# Patient Record
Sex: Female | Born: 1935 | Race: White | Hispanic: No | Marital: Married | Smoking: Former smoker
Health system: Southern US, Community
[De-identification: ages and names within clinical notes are randomized; demographics above are authoritative.]

## PROBLEM LIST (undated history)

## (undated) DIAGNOSIS — R0602 Shortness of breath: Secondary | ICD-10-CM

## (undated) DIAGNOSIS — I10 Essential (primary) hypertension: Secondary | ICD-10-CM

## (undated) DIAGNOSIS — K5900 Constipation, unspecified: Secondary | ICD-10-CM

## (undated) DIAGNOSIS — A0472 Enterocolitis due to Clostridium difficile, not specified as recurrent: Secondary | ICD-10-CM

## (undated) DIAGNOSIS — N189 Chronic kidney disease, unspecified: Secondary | ICD-10-CM

## (undated) DIAGNOSIS — Z7901 Long term (current) use of anticoagulants: Secondary | ICD-10-CM

## (undated) DIAGNOSIS — K219 Gastro-esophageal reflux disease without esophagitis: Secondary | ICD-10-CM

## (undated) DIAGNOSIS — E7800 Pure hypercholesterolemia, unspecified: Secondary | ICD-10-CM

## (undated) HISTORY — PX: ABDOMINAL SURGERY: SHX537

---

## 1998-03-06 ENCOUNTER — Ambulatory Visit (HOSPITAL_COMMUNITY): Admission: RE | Admit: 1998-03-06 | Discharge: 1998-03-06 | Payer: Self-pay | Admitting: Gynecology

## 1998-05-24 ENCOUNTER — Other Ambulatory Visit: Admission: RE | Admit: 1998-05-24 | Discharge: 1998-05-24 | Payer: Self-pay | Admitting: Gynecology

## 1998-12-13 ENCOUNTER — Ambulatory Visit (HOSPITAL_COMMUNITY): Admission: RE | Admit: 1998-12-13 | Discharge: 1998-12-13 | Payer: Self-pay | Admitting: *Deleted

## 1999-06-19 ENCOUNTER — Other Ambulatory Visit: Admission: RE | Admit: 1999-06-19 | Discharge: 1999-06-19 | Payer: Self-pay | Admitting: Gynecology

## 2000-06-25 ENCOUNTER — Encounter: Payer: Self-pay | Admitting: Gynecology

## 2000-06-25 ENCOUNTER — Encounter: Admission: RE | Admit: 2000-06-25 | Discharge: 2000-06-25 | Payer: Self-pay | Admitting: Gynecology

## 2000-06-25 ENCOUNTER — Other Ambulatory Visit: Admission: RE | Admit: 2000-06-25 | Discharge: 2000-06-25 | Payer: Self-pay | Admitting: Gynecology

## 2001-07-29 ENCOUNTER — Other Ambulatory Visit: Admission: RE | Admit: 2001-07-29 | Discharge: 2001-07-29 | Payer: Self-pay | Admitting: Gynecology

## 2002-10-12 ENCOUNTER — Other Ambulatory Visit: Admission: RE | Admit: 2002-10-12 | Discharge: 2002-10-12 | Payer: Self-pay | Admitting: Gynecology

## 2003-05-16 ENCOUNTER — Ambulatory Visit (HOSPITAL_COMMUNITY): Admission: RE | Admit: 2003-05-16 | Discharge: 2003-05-16 | Payer: Self-pay | Admitting: *Deleted

## 2004-12-11 ENCOUNTER — Other Ambulatory Visit: Admission: RE | Admit: 2004-12-11 | Discharge: 2004-12-11 | Payer: Self-pay | Admitting: Gynecology

## 2006-01-14 ENCOUNTER — Other Ambulatory Visit: Admission: RE | Admit: 2006-01-14 | Discharge: 2006-01-14 | Payer: Self-pay | Admitting: Gynecology

## 2006-09-18 ENCOUNTER — Emergency Department: Payer: Self-pay | Admitting: Emergency Medicine

## 2007-02-12 ENCOUNTER — Other Ambulatory Visit: Admission: RE | Admit: 2007-02-12 | Discharge: 2007-02-12 | Payer: Self-pay | Admitting: Gynecology

## 2008-10-18 ENCOUNTER — Emergency Department (HOSPITAL_COMMUNITY): Admission: EM | Admit: 2008-10-18 | Discharge: 2008-10-18 | Payer: Self-pay | Admitting: Emergency Medicine

## 2008-10-20 ENCOUNTER — Encounter (HOSPITAL_COMMUNITY): Admission: RE | Admit: 2008-10-20 | Discharge: 2009-01-18 | Payer: Self-pay | Admitting: Emergency Medicine

## 2011-02-06 ENCOUNTER — Other Ambulatory Visit: Payer: Self-pay | Admitting: Gynecology

## 2011-02-06 DIAGNOSIS — Z1231 Encounter for screening mammogram for malignant neoplasm of breast: Secondary | ICD-10-CM

## 2011-02-25 ENCOUNTER — Ambulatory Visit
Admission: RE | Admit: 2011-02-25 | Discharge: 2011-02-25 | Disposition: A | Discharge status: Home / Self Care | Payer: Medicare Other | Source: Ambulatory Visit | Attending: Gynecology | Admitting: Gynecology

## 2011-02-25 DIAGNOSIS — Z1231 Encounter for screening mammogram for malignant neoplasm of breast: Secondary | ICD-10-CM

## 2012-05-28 ENCOUNTER — Other Ambulatory Visit: Payer: Self-pay | Admitting: Gynecology

## 2012-05-28 DIAGNOSIS — Z1231 Encounter for screening mammogram for malignant neoplasm of breast: Secondary | ICD-10-CM

## 2012-08-27 ENCOUNTER — Ambulatory Visit
Admission: RE | Admit: 2012-08-27 | Discharge: 2012-08-27 | Disposition: A | Discharge status: Home / Self Care | Payer: Medicare Other | Source: Ambulatory Visit | Attending: Gynecology | Admitting: Gynecology

## 2012-08-27 DIAGNOSIS — Z1231 Encounter for screening mammogram for malignant neoplasm of breast: Secondary | ICD-10-CM

## 2013-04-12 ENCOUNTER — Other Ambulatory Visit: Payer: Self-pay | Admitting: Family Medicine

## 2013-04-12 DIAGNOSIS — N63 Unspecified lump in unspecified breast: Secondary | ICD-10-CM

## 2013-05-04 ENCOUNTER — Ambulatory Visit
Admission: RE | Admit: 2013-05-04 | Discharge: 2013-05-04 | Disposition: A | Discharge status: Home / Self Care | Payer: Medicare Other | Source: Ambulatory Visit | Attending: Family Medicine | Admitting: Family Medicine

## 2013-05-04 DIAGNOSIS — N63 Unspecified lump in unspecified breast: Secondary | ICD-10-CM

## 2013-09-09 ENCOUNTER — Other Ambulatory Visit: Payer: Self-pay

## 2013-09-09 DIAGNOSIS — Z1231 Encounter for screening mammogram for malignant neoplasm of breast: Secondary | ICD-10-CM

## 2013-09-30 ENCOUNTER — Ambulatory Visit
Admission: RE | Admit: 2013-09-30 | Discharge: 2013-09-30 | Disposition: A | Discharge status: Home / Self Care | Payer: Medicare Other | Source: Ambulatory Visit

## 2013-09-30 DIAGNOSIS — Z1231 Encounter for screening mammogram for malignant neoplasm of breast: Secondary | ICD-10-CM

## 2014-04-17 ENCOUNTER — Ambulatory Visit
Admission: RE | Admit: 2014-04-17 | Discharge: 2014-04-17 | Disposition: A | Discharge status: Home / Self Care | Payer: Medicare Other | Source: Ambulatory Visit | Attending: Family Medicine | Admitting: Family Medicine

## 2014-04-17 ENCOUNTER — Other Ambulatory Visit: Payer: Self-pay | Admitting: Family Medicine

## 2014-04-17 DIAGNOSIS — R0602 Shortness of breath: Secondary | ICD-10-CM

## 2014-04-26 ENCOUNTER — Other Ambulatory Visit: Payer: Self-pay | Admitting: Orthopedic Surgery

## 2014-04-26 DIAGNOSIS — M48061 Spinal stenosis, lumbar region without neurogenic claudication: Secondary | ICD-10-CM

## 2014-06-09 ENCOUNTER — Encounter (HOSPITAL_COMMUNITY): Payer: Self-pay | Admitting: Pharmacy Technician

## 2014-06-13 ENCOUNTER — Encounter (HOSPITAL_COMMUNITY): Payer: Self-pay | Admitting: *Deleted

## 2014-06-13 ENCOUNTER — Ambulatory Visit (HOSPITAL_COMMUNITY)
Admission: RE | Admit: 2014-06-13 | Discharge: 2014-06-13 | Disposition: A | Discharge status: Home / Self Care | Payer: Medicare Other | Source: Ambulatory Visit | Attending: Cardiology | Admitting: Cardiology

## 2014-06-13 ENCOUNTER — Encounter (HOSPITAL_COMMUNITY)
Admission: RE | Disposition: A | Discharge status: Home / Self Care | Payer: Self-pay | Source: Ambulatory Visit | Attending: Cardiology

## 2014-06-13 DIAGNOSIS — R0602 Shortness of breath: Secondary | ICD-10-CM | POA: Diagnosis present

## 2014-06-13 HISTORY — PX: TEE WITHOUT CARDIOVERSION: SHX5443

## 2014-06-13 HISTORY — DX: Gastro-esophageal reflux disease without esophagitis: K21.9

## 2014-06-13 HISTORY — DX: Shortness of breath: R06.02

## 2014-06-13 HISTORY — DX: Essential (primary) hypertension: I10

## 2014-06-13 HISTORY — DX: Chronic kidney disease, unspecified: N18.9

## 2014-06-13 SURGERY — ECHOCARDIOGRAM, TRANSESOPHAGEAL
Anesthesia: Moderate Sedation

## 2014-06-13 MED ORDER — BUTAMBEN-TETRACAINE-BENZOCAINE 2-2-14 % EX AERO
INHALATION_SPRAY | CUTANEOUS | Status: DC | PRN
  Administered 2014-06-13: 2 via TOPICAL

## 2014-06-13 MED ORDER — SODIUM CHLORIDE 0.9 % IV SOLN
INTRAVENOUS | Status: DC
  Administered 2014-06-13: 500 mL via INTRAVENOUS

## 2014-06-13 MED ORDER — MIDAZOLAM HCL 10 MG/2ML IJ SOLN
INTRAMUSCULAR | Status: DC | PRN
  Administered 2014-06-13: 2 mg via INTRAVENOUS
  Administered 2014-06-13 (×2): 1 mg via INTRAVENOUS
  Administered 2014-06-13 (×2): 2 mg via INTRAVENOUS

## 2014-06-13 MED ORDER — FENTANYL CITRATE 0.05 MG/ML IJ SOLN
INTRAMUSCULAR | Status: DC | PRN
  Administered 2014-06-13 (×3): 25 ug via INTRAVENOUS

## 2014-06-13 MED ORDER — FENTANYL CITRATE 0.05 MG/ML IJ SOLN
INTRAMUSCULAR | Status: AC
  Filled 2014-06-13: qty 2

## 2014-06-13 MED ORDER — MIDAZOLAM HCL 5 MG/ML IJ SOLN
INTRAMUSCULAR | Status: AC
  Filled 2014-06-13: qty 2

## 2014-06-13 NOTE — H&P (Signed)
  Please see office visit notes for complete details of HPI.  

## 2014-06-13 NOTE — Discharge Instructions (Signed)
Conscious Sedation, Adult, Care After °Refer to this sheet in the next few weeks. These instructions provide you with information on caring for yourself after your procedure. Your health care provider may also give you more specific instructions. Your treatment has been planned according to current medical practices, but problems sometimes occur. Call your health care provider if you have any problems or questions after your procedure. °WHAT TO EXPECT AFTER THE PROCEDURE  °After your procedure: °· You may feel sleepy, clumsy, and have poor balance for several hours. °· Vomiting may occur if you eat too soon after the procedure. °HOME CARE INSTRUCTIONS °· Do not participate in any activities where you could become injured for at least 24 hours. Do not: °¨ Drive. °¨ Swim. °¨ Ride a bicycle. °¨ Operate heavy machinery. °¨ Cook. °¨ Use power tools. °¨ Climb ladders. °¨ Work from a high place. °· Do not make important decisions or sign legal documents until you are improved. °· If you vomit, drink water, juice, or soup when you can drink without vomiting. Make sure you have little or no nausea before eating solid foods. °· Only take over-the-counter or prescription medicines for pain, discomfort, or fever as directed by your health care provider. °· Make sure you and your family fully understand everything about the medicines given to you, including what side effects may occur. °· You should not drink alcohol, take sleeping pills, or take medicines that cause drowsiness for at least 24 hours. °· If you smoke, do not smoke without supervision. °· If you are feeling better, you may resume normal activities 24 hours after you were sedated. °· Keep all appointments with your health care provider. °SEEK MEDICAL CARE IF: °· Your skin is pale or bluish in color. °· You continue to feel nauseous or vomit. °· Your pain is getting worse and is not helped by medicine. °· You have bleeding or swelling. °· You are still sleepy or  feeling clumsy after 24 hours. °SEEK IMMEDIATE MEDICAL CARE IF: °· You develop a rash. °· You have difficulty breathing. °· You develop any type of allergic problem. °· You have a fever. °MAKE SURE YOU: °· Understand these instructions. °· Will watch your condition. °· Will get help right away if you are not doing well or get worse. °Document Released: 06/15/2013 Document Reviewed: 06/15/2013 °ExitCare® Patient Information ©2015 [employer]. This information is not intended to replace advice given to you by your health care provider. Make sure you discuss any questions you have with your health care provider. °  °

## 2014-06-13 NOTE — CV Procedure (Signed)
Unable to pass the probe in spite multiple attempts. Dr. Arta Silence (GI) also attempted to introduce the probe without success. Will set up for Upper GI endoscopy and TEE at the same time again

## 2014-06-13 NOTE — Interval H&P Note (Signed)
History and Physical Interval Note:  06/13/2014 1:17 PM  Susan Banks  has presented today for surgery, with the diagnosis of LEFT ATRIAL MYXOMA   The various methods of treatment have been discussed with the patient and family. After consideration of risks, benefits and other options for treatment, the patient has consented to  Procedure(s): TRANSESOPHAGEAL ECHOCARDIOGRAM (TEE) (N/A) as a surgical intervention .  The patient's history has been reviewed, patient examined, no change in status, stable for surgery.  I have reviewed the patient's chart and labs.  Questions were answered to the patient's satisfaction.     Laverda Page

## 2014-06-14 ENCOUNTER — Encounter (HOSPITAL_COMMUNITY): Payer: Self-pay | Admitting: Cardiology

## 2014-06-27 ENCOUNTER — Encounter (HOSPITAL_COMMUNITY): Payer: Self-pay | Admitting: Pharmacy Technician

## 2014-07-04 ENCOUNTER — Other Ambulatory Visit: Payer: Self-pay | Admitting: Gastroenterology

## 2014-07-04 ENCOUNTER — Ambulatory Visit (HOSPITAL_COMMUNITY)
Admission: RE | Admit: 2014-07-04 | Discharge: 2014-07-04 | Disposition: A | Discharge status: Home / Self Care | Payer: Medicare Other | Source: Ambulatory Visit | Attending: Cardiology | Admitting: Cardiology

## 2014-07-04 ENCOUNTER — Encounter (HOSPITAL_COMMUNITY): Payer: Self-pay

## 2014-07-04 ENCOUNTER — Encounter (HOSPITAL_COMMUNITY)
Admission: RE | Disposition: A | Discharge status: Home / Self Care | Payer: Self-pay | Source: Ambulatory Visit | Attending: Cardiology

## 2014-07-04 DIAGNOSIS — K219 Gastro-esophageal reflux disease without esophagitis: Secondary | ICD-10-CM | POA: Insufficient documentation

## 2014-07-04 DIAGNOSIS — Z5309 Procedure and treatment not carried out because of other contraindication: Secondary | ICD-10-CM | POA: Diagnosis not present

## 2014-07-04 DIAGNOSIS — E780 Pure hypercholesterolemia: Secondary | ICD-10-CM | POA: Insufficient documentation

## 2014-07-04 DIAGNOSIS — I083 Combined rheumatic disorders of mitral, aortic and tricuspid valves: Secondary | ICD-10-CM | POA: Diagnosis not present

## 2014-07-04 DIAGNOSIS — R1310 Dysphagia, unspecified: Secondary | ICD-10-CM | POA: Insufficient documentation

## 2014-07-04 DIAGNOSIS — N183 Chronic kidney disease, stage 3 (moderate): Secondary | ICD-10-CM | POA: Insufficient documentation

## 2014-07-04 DIAGNOSIS — I440 Atrioventricular block, first degree: Secondary | ICD-10-CM | POA: Insufficient documentation

## 2014-07-04 DIAGNOSIS — I129 Hypertensive chronic kidney disease with stage 1 through stage 4 chronic kidney disease, or unspecified chronic kidney disease: Secondary | ICD-10-CM | POA: Diagnosis not present

## 2014-07-04 DIAGNOSIS — D151 Benign neoplasm of heart: Secondary | ICD-10-CM | POA: Insufficient documentation

## 2014-07-04 DIAGNOSIS — R0600 Dyspnea, unspecified: Secondary | ICD-10-CM | POA: Insufficient documentation

## 2014-07-04 DIAGNOSIS — Z87891 Personal history of nicotine dependence: Secondary | ICD-10-CM | POA: Insufficient documentation

## 2014-07-04 HISTORY — PX: ESOPHAGOGASTRODUODENOSCOPY: SHX5428

## 2014-07-04 HISTORY — DX: Constipation, unspecified: K59.00

## 2014-07-04 SURGERY — EGD (ESOPHAGOGASTRODUODENOSCOPY)
Anesthesia: Moderate Sedation

## 2014-07-04 MED ORDER — FENTANYL CITRATE 0.05 MG/ML IJ SOLN
INTRAMUSCULAR | Status: AC
  Filled 2014-07-04: qty 2

## 2014-07-04 MED ORDER — MIDAZOLAM HCL 5 MG/ML IJ SOLN
INTRAMUSCULAR | Status: AC
  Filled 2014-07-04: qty 2

## 2014-07-04 MED ORDER — MIDAZOLAM HCL 10 MG/2ML IJ SOLN
INTRAMUSCULAR | Status: DC | PRN
  Administered 2014-07-04 (×2): 1 mg via INTRAVENOUS
  Administered 2014-07-04 (×2): 2 mg via INTRAVENOUS

## 2014-07-04 MED ORDER — SODIUM CHLORIDE 0.9 % IV SOLN
INTRAVENOUS | Status: DC
  Administered 2014-07-04: 500 mL via INTRAVENOUS

## 2014-07-04 MED ORDER — BUTAMBEN-TETRACAINE-BENZOCAINE 2-2-14 % EX AERO
INHALATION_SPRAY | CUTANEOUS | Status: DC | PRN
  Administered 2014-07-04: 2 via TOPICAL

## 2014-07-04 MED ORDER — FENTANYL CITRATE 0.05 MG/ML IJ SOLN
INTRAMUSCULAR | Status: DC | PRN
  Administered 2014-07-04 (×2): 25 ug via INTRAVENOUS

## 2014-07-04 NOTE — H&P (Signed)
Susan Banks is an 80 y.o. female.   Chief Complaint: Her for TEE to evaluate intracardiac mass HPI: Patient is a fairly active 80 year old Caucasian female who presents for f/u evaluation of marked dyspnea on exertion. Symptoms started about 6 months ago. Patient unable to walk to her mailbox and back without having to rest. She is also noticed dyspnea while doing routine chores at home.  She denies any palpitations, chest pain, syncope. She does have dizziness, vertigo, but this has reduced since she reduce the dose of trazodone that she takes on a daily basis to help her aid with sleep.  Denies any headache, visual disturbances, neurologic deficits, symptoms to suggest claudication. Her main other complaint was also difficulty falling asleep. She has chronic back pain and has MRI scheduled.  She is accompanied by her daughter and states that she is doing better since last OV since stopping Trazadone. No new symptoms. No TIA or claudications.  Past Medical History  Diagnosis Date  . Hypertension   . Shortness of breath   . Chronic kidney disease   . GERD (gastroesophageal reflux disease)   . Constipation     Past Surgical History  Procedure Laterality Date  . Tee without cardioversion N/A 06/13/2014    Procedure: TRANSESOPHAGEAL ECHOCARDIOGRAM (TEE);  Surgeon: Laverda Page, MD;  Location: [facility];  Service: Cardiovascular;  Laterality: N/A;    History reviewed. No pertinent family history. Social History:  reports that she quit smoking about 31 years ago. She does not have any smokeless tobacco history on file. She reports that she does not drink alcohol or use illicit drugs.  Allergies:  Allergies  Allergen Reactions  . Sulfa Antibiotics Nausea And Vomiting  . Hydrocodone-Acetaminophen Rash    Medications Prior to Admission  Medication Sig Dispense Refill  . atorvastatin (LIPITOR) 20 MG tablet Take 20 mg by mouth at bedtime.      . cholecalciferol (VITAMIN D)  1000 UNITS tablet Take 1,000 Units by mouth daily.      [name] olmesartan-hydrochlorothiazide (BENICAR HCT) 40-25 MG per tablet Take 0.5 tablets by mouth daily with supper.      [name] omeprazole (PRILOSEC OTC) 20 MG tablet Take 20 mg by mouth daily.      Vladimir Faster Glycol-Propyl Glycol (SYSTANE OP) Place 1 drop into both eyes daily.      . polyethylene glycol (MIRALAX / GLYCOLAX) packet Take 17 g by mouth daily.      . psyllium (METAMUCIL) 58.6 % packet Take 1 packet by mouth daily.      . vitamin E 400 UNIT capsule Take 800 Units by mouth daily.          Review of Systems - See hopi  Blood pressure 167/59, pulse 76, temperature 97.8 F (36.6 C), temperature source Oral, resp. rate 19, SpO2 98.00%. General: Moderately built and normal body habitus who is in no acute distress. Appears stated age. Alert Ox3.   There is no cyanosis. HEENT: normal limits. No JVD.   CARDIAC EXAM: S1, S2 normal, no gallop present. No murmur.   CHEST EXAM: No tenderness of chest wall. LUNGS: Clear to percuss and auscultate.  ABDOMEN: No hepatosplenomegaly. BS normal in all 4 quadrants. Abdomen is non-tender.   EXTREMITY: Full range of movementes, No edema. MUSCULOSKELETAL EXAM: Intact with full range of motion in all 4 extremities.   NEUROLOGIC EXAM: Grossly intact without any focal deficits. Alert O x 3.  No results found for this or any previous visit (from  the past 48 hour(s)). No results found.  Labs:   No results found for this basename: WBC, HGB, HCT, MCV, PLT   No results found for this basename: NA, K, CL, CO2, BUN, CREATININE, CALCIUM, LABALBU, PROT, BILITOT, ALKPHOS, ALT, AST, GLUCOSE,  in the last 168 hours No results found for this basename: CKTOTAL, CKMB, CKMBINDEX, TROPONINI    Assessment/Plan Myxoma of heart  Story: Echocardiogram 05/16/2014: Left ventricle cavity is normal in size. Normal global wall motion. Normal syst. function, calculated EF is 76%. Left atrial cavity is normal  in size. A large echogenic density is seen in LA, adjacent to septum. It may be due to artifact/reverberation from mitral annulus calcification, but, mass can't be ruled out. Suggest further work up with TEE or MRI. Mild aortic regurgitation. Mild aortic valve leaflet thickening with mild calcification. Mild mitral regurgitation. Moderate calcification of the mitral valve annulus. Mild tricuspid regurgitation. No evidence of pulmonary hypertension.   Dyspnea on exertion   Lexiscan Myoview stress test 05/05/2014: 1. Resting EKG demonstrates sinus rhythm with first-degree AV block, poor R-wave progression. Stress ECG is nondiagnostic for ischemia as it's a pharmacologic stress test. Resting heart rate was 74 bpm with a blood pressure of 164/90 mmHg. Peak heart rate was 125 bpm with a peak pressure of 120/62 mmHg with Lexiscan infusion. Stress symptoms included dyspnea and nausea. 2. The perfusion imaging study demonstrates t.i.d. index to be 0.86. Left ventricular end-diastolic volume is 48 mL. There was uniform radioisotope uptake in both rest and stress images without evidence of ischemia. LVEF by QGS was 73%.  Hypercholesteremia   Labs 04/17/2014: Total cholesterol 173, triglycerides 146, HDL 43, LDL 101. Non-HDL cholesterol 130.  CKD stage 3 due to type 2 diabetes mellitus Labs 04/17/2014: BUN 18, serum creatinine 1.36, eGFR 37 mL. Hemoglobin 4.1/hematocrit 36.4, normal indices  Recommendation: I had attempted TEE 2 weeks ago, had difficulty, Dr. Ferdinand Cava, GI specialist also attempted, hence the procedure was canceled. Today she has been scheduled for upper GI endoscopy by Dr. Laurence Spates followed by TEE. All questions were answered including risks, benefits and alternatives and complications associated with the procedure. Patient's daughter and patient understand and are willing to proceed.  Laverda Page, MD 07/04/2014, 12:59 PM [facility]. [place] Pager:  [phone] Office: [phone] If no answer: Cell:  [phone]

## 2014-07-04 NOTE — H&P (View-Only) (Signed)
  Please see office visit notes for complete details of HPI.  

## 2014-07-04 NOTE — CV Procedure (Signed)
Patient initially had endoscopy by Dr. Oletta Lamas, unable to pass even a pediatric probe, hence procedure cancelled

## 2014-07-04 NOTE — Op Note (Signed)
[facility] [place], [address]   ENDOSCOPY PROCEDURE REPORT  PATIENT: Susan Banks, Susan Banks  MR#: [id number] BIRTHDATE: [birth year] , 30  yrs. old GENDER: female ENDOSCOPIST: Laurence Spates, MD REFERRED BY:  Kela Millin, M.D. PROCEDURE DATE:  16-Jul-2014 PROCEDURE:  EGD, diagnostic   Attempted was unsuccessfull. ASA CLASS:     Class II INDICATIONS:  Needs TEE and has had trouble passing TEE scope in past.  Chronic problems with eating and swallowing pills.[name] MEDICATIONS: Fentanyl 50 mcg IV and Versed 6 mg IV TOPICAL ANESTHETIC: Cetacaine Spray  DESCRIPTION OF PROCEDURE: After the risks benefits and alternatives of the procedure were thoroughly explained, informed consent was obtained.  The [id number] ([id number]) endoscope was introduced through the mouth and advanced to the second portion of the duodenum , Without limitations.  The instrument was slowly withdrawn as the mucosa was fully examined   We were unable to pass the adult scope and then tried the pediatric scope blindly as well as under direct visualization and were unable to pass that as well.         The scope was then withdrawn from the patient and the procedure completed.  COMPLICATIONS: There were no immediate complications.  ENDOSCOPIC IMPRESSION: We were unable to pass the adult scope and then tried the pediatric scope blindly as well as under direct visualization and were unable to pass that as well  RECOMMENDATIONS: Suggest ENT evaluation.  REPEAT EXAM:  eSigned:  Laurence Spates, MD 07-16-2014 1:40 PM    CC:  CPT CODES: ICD CODES:  The ICD and CPT codes recommended by this software are interpretations from the data that the clinical staff has captured with the software.  The verification of the translation of this report to the ICD and CPT codes and modifiers is the sole responsibility of the health care institution and practicing physician where this report was  generated.  [employer] will not be held responsible for the validity of the ICD and CPT codes included on this report.  AMA assumes no liability for data contained or not contained herein. CPT is a Designer, television/film set of the [employer].  PATIENT NAME:  [name], [name] MR#: [id number]

## 2014-07-04 NOTE — Progress Notes (Signed)
*  PRELIMINARY RESULTS* Echocardiogram 2D Echocardiogram has been performed.  Leavy Cella 07/04/2014, 2:18 PM

## 2014-07-04 NOTE — Interval H&P Note (Signed)
History and Physical Interval Note:  07/04/2014 1:01 PM  Susan Banks  has presented today for surgery, with the diagnosis of myxoma of the heart  The various methods of treatment have been discussed with the patient and family. After consideration of risks, benefits and other options for treatment, the patient has consented to  Procedure(s) with comments: TRANSESOPHAGEAL ECHOCARDIOGRAM (TEE) (N/A) - Dr Oletta Lamas will help Dr Einar Gip  insert the TEE scope H&P in file as a surgical intervention .  The patient's history has been reviewed, patient examined, no change in status, stable for surgery.  I have reviewed the patient's chart and labs.  Questions were answered to the patient's satisfaction.     Vernel Langenderfer JR,Calandra Madura L

## 2014-07-04 NOTE — Addendum Note (Signed)
Addended by: Vernie Ammons. on: 07/04/2014 05:22 PM   Modules accepted: Orders

## 2014-07-04 NOTE — Discharge Instructions (Addendum)
Conscious Sedation, Adult, Care After °Refer to this sheet in the next few weeks. These instructions provide you with information on caring for yourself after your procedure. Your health care provider may also give you more specific instructions. Your treatment has been planned according to current medical practices, but problems sometimes occur. Call your health care provider if you have any problems or questions after your procedure. °WHAT TO EXPECT AFTER THE PROCEDURE  °After your procedure: °· You may feel sleepy, clumsy, and have poor balance for several hours. °· Vomiting may occur if you eat too soon after the procedure. °HOME CARE INSTRUCTIONS °· Do not participate in any activities where you could become injured for at least 24 hours. Do not: °¨ Drive. °¨ Swim. °¨ Ride a bicycle. °¨ Operate heavy machinery. °¨ Cook. °¨ Use power tools. °¨ Climb ladders. °¨ Work from a high place. °· Do not make important decisions or sign legal documents until you are improved. °· If you vomit, drink water, juice, or soup when you can drink without vomiting. Make sure you have little or no nausea before eating solid foods. °· Only take over-the-counter or prescription medicines for pain, discomfort, or fever as directed by your health care provider. °· Make sure you and your family fully understand everything about the medicines given to you, including what side effects may occur. °· You should not drink alcohol, take sleeping pills, or take medicines that cause drowsiness for at least 24 hours. °· If you smoke, do not smoke without supervision. °· If you are feeling better, you may resume normal activities 24 hours after you were sedated. °· Keep all appointments with your health care provider. °SEEK MEDICAL CARE IF: °· Your skin is pale or bluish in color. °· You continue to feel nauseous or vomit. °· Your pain is getting worse and is not helped by medicine. °· You have bleeding or swelling. °· You are still sleepy or  feeling clumsy after 24 hours. °SEEK IMMEDIATE MEDICAL CARE IF: °· You develop a rash. °· You have difficulty breathing. °· You develop any type of allergic problem. °· You have a fever. °MAKE SURE YOU: °· Understand these instructions. °· Will watch your condition. °· Will get help right away if you are not doing well or get worse. °Document Released: 06/15/2013 Document Reviewed: 06/15/2013 °ExitCare® Patient Information ©2015 [employer]. This information is not intended to replace advice given to you by your health care provider. Make sure you discuss any questions you have with your health care provider. °  °

## 2014-07-06 ENCOUNTER — Encounter (HOSPITAL_COMMUNITY): Payer: Self-pay | Admitting: Cardiology

## 2014-07-20 ENCOUNTER — Other Ambulatory Visit (INDEPENDENT_AMBULATORY_CARE_PROVIDER_SITE_OTHER): Payer: Self-pay | Admitting: Otolaryngology

## 2014-07-20 DIAGNOSIS — K222 Esophageal obstruction: Secondary | ICD-10-CM

## 2014-07-21 ENCOUNTER — Ambulatory Visit (HOSPITAL_COMMUNITY): Payer: Medicare Other

## 2014-08-16 ENCOUNTER — Ambulatory Visit (HOSPITAL_COMMUNITY)
Admission: RE | Admit: 2014-08-16 | Discharge: 2014-08-16 | Disposition: A | Discharge status: Home / Self Care | Payer: Medicare Other | Source: Ambulatory Visit | Attending: Otolaryngology | Admitting: Otolaryngology

## 2014-08-16 DIAGNOSIS — R1310 Dysphagia, unspecified: Secondary | ICD-10-CM | POA: Insufficient documentation

## 2014-08-16 DIAGNOSIS — K225 Diverticulum of esophagus, acquired: Secondary | ICD-10-CM | POA: Insufficient documentation

## 2014-08-16 DIAGNOSIS — K222 Esophageal obstruction: Secondary | ICD-10-CM | POA: Diagnosis not present

## 2015-04-27 ENCOUNTER — Other Ambulatory Visit: Payer: Self-pay | Admitting: Orthopedic Surgery

## 2015-04-27 DIAGNOSIS — M48061 Spinal stenosis, lumbar region without neurogenic claudication: Secondary | ICD-10-CM

## 2015-05-07 ENCOUNTER — Ambulatory Visit
Admission: RE | Admit: 2015-05-07 | Discharge: 2015-05-07 | Disposition: A | Discharge status: Home / Self Care | Payer: Medicare Other | Source: Ambulatory Visit | Attending: Orthopedic Surgery | Admitting: Orthopedic Surgery

## 2015-05-07 DIAGNOSIS — M48061 Spinal stenosis, lumbar region without neurogenic claudication: Secondary | ICD-10-CM

## 2015-05-07 MED ORDER — IOHEXOL 180 MG/ML  SOLN
1.0000 mL | Freq: Once | INTRAMUSCULAR | Status: DC | PRN
  Administered 2015-05-07: 1 mL via EPIDURAL

## 2015-05-07 MED ORDER — METHYLPREDNISOLONE ACETATE 40 MG/ML INJ SUSP (RADIOLOG
120.0000 mg | Freq: Once | INTRAMUSCULAR | Status: AC
Start: 2015-05-07 — End: 2015-05-07
  Administered 2015-05-07: 120 mg via EPIDURAL

## 2015-05-07 NOTE — Discharge Instructions (Signed)

## 2015-05-08 ENCOUNTER — Other Ambulatory Visit: Payer: Self-pay

## 2015-05-08 DIAGNOSIS — Z1231 Encounter for screening mammogram for malignant neoplasm of breast: Secondary | ICD-10-CM

## 2015-05-09 ENCOUNTER — Ambulatory Visit
Admission: RE | Admit: 2015-05-09 | Discharge: 2015-05-09 | Disposition: A | Discharge status: Home / Self Care | Payer: Medicare Other | Source: Ambulatory Visit

## 2015-05-09 DIAGNOSIS — Z1231 Encounter for screening mammogram for malignant neoplasm of breast: Secondary | ICD-10-CM

## 2015-05-19 ENCOUNTER — Inpatient Hospital Stay (HOSPITAL_COMMUNITY)
Admission: EM | Admit: 2015-05-19 | Discharge: 2015-05-22 | DRG: 372 | Disposition: A | Discharge status: Home / Self Care | Payer: Medicare Other | Attending: Internal Medicine | Admitting: Internal Medicine

## 2015-05-19 ENCOUNTER — Emergency Department (HOSPITAL_COMMUNITY): Payer: Medicare Other

## 2015-05-19 ENCOUNTER — Encounter (HOSPITAL_COMMUNITY): Payer: Self-pay | Admitting: *Deleted

## 2015-05-19 DIAGNOSIS — D72829 Elevated white blood cell count, unspecified: Secondary | ICD-10-CM | POA: Diagnosis not present

## 2015-05-19 DIAGNOSIS — A047 Enterocolitis due to Clostridium difficile: Principal | ICD-10-CM | POA: Diagnosis present

## 2015-05-19 DIAGNOSIS — K5130 Ulcerative (chronic) rectosigmoiditis without complications: Secondary | ICD-10-CM

## 2015-05-19 DIAGNOSIS — N183 Chronic kidney disease, stage 3 unspecified: Secondary | ICD-10-CM | POA: Diagnosis present

## 2015-05-19 DIAGNOSIS — I2510 Atherosclerotic heart disease of native coronary artery without angina pectoris: Secondary | ICD-10-CM | POA: Diagnosis present

## 2015-05-19 DIAGNOSIS — A0472 Enterocolitis due to Clostridium difficile, not specified as recurrent: Secondary | ICD-10-CM

## 2015-05-19 DIAGNOSIS — I10 Essential (primary) hypertension: Secondary | ICD-10-CM | POA: Diagnosis present

## 2015-05-19 DIAGNOSIS — K219 Gastro-esophageal reflux disease without esophagitis: Secondary | ICD-10-CM

## 2015-05-19 DIAGNOSIS — I129 Hypertensive chronic kidney disease with stage 1 through stage 4 chronic kidney disease, or unspecified chronic kidney disease: Secondary | ICD-10-CM | POA: Diagnosis present

## 2015-05-19 DIAGNOSIS — N179 Acute kidney failure, unspecified: Secondary | ICD-10-CM | POA: Diagnosis present

## 2015-05-19 DIAGNOSIS — R197 Diarrhea, unspecified: Secondary | ICD-10-CM | POA: Diagnosis present

## 2015-05-19 DIAGNOSIS — E860 Dehydration: Secondary | ICD-10-CM | POA: Diagnosis present

## 2015-05-19 DIAGNOSIS — E871 Hypo-osmolality and hyponatremia: Secondary | ICD-10-CM

## 2015-05-19 DIAGNOSIS — D649 Anemia, unspecified: Secondary | ICD-10-CM

## 2015-05-19 LAB — CBC
HCT: 38.3 % (ref 36.0–46.0)
Hemoglobin: 13 g/dL (ref 12.0–15.0)
MCH: 30.2 pg (ref 26.0–34.0)
MCHC: 33.9 g/dL (ref 30.0–36.0)
MCV: 88.9 fL (ref 78.0–100.0)
PLATELETS: 272 10*3/uL (ref 150–400)
RBC: 4.31 MIL/uL (ref 3.87–5.11)
RDW: 13 % (ref 11.5–15.5)
WBC: 16.5 10*3/uL — ABNORMAL HIGH (ref 4.0–10.5)

## 2015-05-19 LAB — URINALYSIS, ROUTINE W REFLEX MICROSCOPIC
Bilirubin Urine: NEGATIVE
Glucose, UA: NEGATIVE mg/dL
Hgb urine dipstick: NEGATIVE
KETONES UR: 15 mg/dL — AB
Nitrite: NEGATIVE
PH: 5.5 (ref 5.0–8.0)
Protein, ur: NEGATIVE mg/dL
SPECIFIC GRAVITY, URINE: 1.021 (ref 1.005–1.030)
Urobilinogen, UA: 0.2 mg/dL (ref 0.0–1.0)

## 2015-05-19 LAB — COMPREHENSIVE METABOLIC PANEL
ALBUMIN: 3.9 g/dL (ref 3.5–5.0)
ALT: 16 U/L (ref 14–54)
AST: 21 U/L (ref 15–41)
Alkaline Phosphatase: 133 U/L — ABNORMAL HIGH (ref 38–126)
Anion gap: 12 (ref 5–15)
BUN: 27 mg/dL — AB (ref 6–20)
CHLORIDE: 101 mmol/L (ref 101–111)
CO2: 20 mmol/L — ABNORMAL LOW (ref 22–32)
CREATININE: 1.31 mg/dL — AB (ref 0.44–1.00)
Calcium: 9.8 mg/dL (ref 8.9–10.3)
GFR calc Af Amer: 41 mL/min — ABNORMAL LOW (ref >60)
GFR calc non Af Amer: 35 mL/min — ABNORMAL LOW (ref >60)
GLUCOSE: 111 mg/dL — AB (ref 65–99)
Potassium: 4.1 mmol/L (ref 3.5–5.1)
SODIUM: 133 mmol/L — AB (ref 135–145)
Total Bilirubin: 0.7 mg/dL (ref 0.3–1.2)
Total Protein: 7.1 g/dL (ref 6.5–8.1)

## 2015-05-19 LAB — LIPASE, BLOOD: LIPASE: 20 U/L — AB (ref 22–51)

## 2015-05-19 LAB — URINE MICROSCOPIC-ADD ON

## 2015-05-19 MED ORDER — IOHEXOL 300 MG/ML  SOLN: 25.0000 mL | Freq: Once | INTRAMUSCULAR | Status: DC | PRN

## 2015-05-19 MED ORDER — FENTANYL CITRATE (PF) 100 MCG/2ML IJ SOLN
25.0000 ug | Freq: Once | INTRAMUSCULAR | Status: AC
  Administered 2015-05-19: 25 ug via INTRAVENOUS
  Filled 2015-05-19: qty 2

## 2015-05-19 MED ORDER — SODIUM CHLORIDE 0.9 % IV BOLUS (SEPSIS)
1000.0000 mL | Freq: Once | INTRAVENOUS | Status: AC
  Administered 2015-05-19: 1000 mL via INTRAVENOUS

## 2015-05-19 NOTE — ED Provider Notes (Signed)
CSN: [id number]     Arrival date & time 05/19/15  1213 History   First MD Initiated Contact with Patient 05/19/15 1830     Chief Complaint  Patient presents with  . Abdominal Pain  . Diarrhea     (Consider location/radiation/quality/duration/timing/severity/associated sxs/prior Treatment) Patient is a 81 y.o. female presenting with abdominal pain.  Abdominal Pain Pain location:  LLQ, RLQ and suprapubic Pain quality: aching   Pain radiates to:  Does not radiate Pain severity:  Mild Onset quality:  Gradual Duration:  6 days Timing:  Constant Progression:  Worsening Chronicity:  New Context: recent illness   Context comment:  Abx 1 week ago Relieved by:  None tried Worsened by:  Nothing tried Ineffective treatments:  None tried Associated symptoms: diarrhea   Associated symptoms: no chest pain, no chills, no constipation, no cough, no dysuria, no fever, no nausea, no shortness of breath, no sore throat and no vomiting   Risk factors: being elderly     Past Medical History  Diagnosis Date  . Hypertension   . Shortness of breath   . Chronic kidney disease   . GERD (gastroesophageal reflux disease)   . Constipation    Past Surgical History  Procedure Laterality Date  . Tee without cardioversion N/A 06/13/2014    Procedure: TRANSESOPHAGEAL ECHOCARDIOGRAM (TEE);  Surgeon: Laverda Page, MD;  Location: [facility] ENDOSCOPY;  Service: Cardiovascular;  Laterality: N/A;  . Esophagogastroduodenoscopy N/A 07/04/2014    Procedure: ESOPHAGOGASTRODUODENOSCOPY (EGD);  Surgeon: Laverda Page, MD;  Location: [place];  Service: Cardiovascular;  Laterality: N/A;   History reviewed. No pertinent family history. Social History  Substance Use Topics  . Smoking status: Former Smoker    Quit date: 06/14/1983  . Smokeless tobacco: None  . Alcohol Use: No   OB History    No data available     Review of Systems  Constitutional: Negative for fever and chills.  HENT: Negative for  congestion and sore throat.   Eyes: Negative for visual disturbance.  Respiratory: Negative for cough, shortness of breath and wheezing.   Cardiovascular: Negative for chest pain.  Gastrointestinal: Positive for abdominal pain and diarrhea. Negative for nausea, vomiting, constipation, blood in stool and abdominal distention.  Genitourinary: Negative for dysuria, difficulty urinating and vaginal pain.  Musculoskeletal: Negative for myalgias and arthralgias.  Skin: Negative for rash.  Neurological: Negative for syncope and headaches.  Psychiatric/Behavioral: Negative for behavioral problems.  All other systems reviewed and are negative.     Allergies  Oxycodone; Tramadol hcl; Hydrocodone-acetaminophen; and Sulfa antibiotics  Home Medications   Prior to Admission medications   Medication Sig Start Date End Date Taking? Authorizing Provider  atorvastatin (LIPITOR) 20 MG tablet Take 20 mg by mouth at bedtime.   Yes Historical Provider, MD  cholecalciferol (VITAMIN D) 1000 UNITS tablet Take 1,000 Units by mouth daily.   Yes Historical Provider, MD  hydrocortisone (ANUSOL-HC) 2.5 % rectal cream Place 1 application rectally 2 (two) times daily.   Yes Historical Provider, MD  olmesartan-hydrochlorothiazide (BENICAR HCT) 40-25 MG per tablet Take 0.5 tablets by mouth daily with supper.   Yes Historical Provider, MD  omeprazole (PRILOSEC OTC) 20 MG tablet Take 20 mg by mouth daily.   Yes Historical Provider, MD  Polyethyl Glycol-Propyl Glycol (SYSTANE OP) Place 1 drop into both eyes daily as needed (for dry eyes).    Yes Historical Provider, MD  polyethylene glycol (MIRALAX / GLYCOLAX) packet Take 17 g by mouth daily as needed for moderate  constipation.    Yes Historical Provider, MD  vitamin E 400 UNIT capsule Take 800 Units by mouth daily.   Yes Historical Provider, MD   BP 134/63 mmHg  Pulse 95  Temp(Src) 97.9 F (36.6 C) (Oral)  Resp 18  Ht 5\' 3"  (1.6 m)  Wt 139 lb 4.8 oz (63.186 kg)   BMI 24.68 kg/m2  SpO2 94% Physical Exam  Constitutional: She is oriented to person, place, and time. She appears well-developed and well-nourished. No distress.  HENT:  Head: Normocephalic and atraumatic.  Eyes: EOM are normal.  Neck: Normal range of motion.  Cardiovascular: Normal rate, regular rhythm and normal heart sounds.   No murmur heard. Pulmonary/Chest: Effort normal and breath sounds normal. No respiratory distress. She has no wheezes.  Abdominal: Soft. Bowel sounds are normal. She exhibits no distension. There is tenderness in the left lower quadrant.  Musculoskeletal: She exhibits no edema.  Neurological: She is alert and oriented to person, place, and time.  Skin: She is not diaphoretic.  Psychiatric: She has a normal mood and affect. Her behavior is normal.    ED Course  Procedures (including critical care time) Labs Review Labs Reviewed  LIPASE, BLOOD - Abnormal; Notable for the following:    Lipase 20 (*)    All other components within normal limits  COMPREHENSIVE METABOLIC PANEL - Abnormal; Notable for the following:    Sodium 133 (*)    CO2 20 (*)    Glucose, Bld 111 (*)    BUN 27 (*)    Creatinine, Ser 1.31 (*)    Alkaline Phosphatase 133 (*)    GFR calc non Af Amer 35 (*)    GFR calc Af Amer 41 (*)    All other components within normal limits  CBC - Abnormal; Notable for the following:    WBC 16.5 (*)    All other components within normal limits  URINALYSIS, ROUTINE W REFLEX MICROSCOPIC (NOT AT [facility]) - Abnormal; Notable for the following:    Ketones, ur 15 (*)    Leukocytes, UA SMALL (*)    All other components within normal limits  URINE MICROSCOPIC-ADD ON  GI PATHOGEN PANEL BY PCR, STOOL    Imaging Review Ct Abdomen Pelvis Wo Contrast  05/19/2015   CLINICAL DATA:  81 year old female with left lower quadrant abdominal pain  EXAM: CT ABDOMEN AND PELVIS WITHOUT CONTRAST  TECHNIQUE: Multidetector CT imaging of the abdomen and pelvis was performed  following the standard protocol without IV contrast.  COMPARISON:  None.  FINDINGS: Evaluation of this exam is limited in the absence of intravenous contrast.  The visualized lung bases are clear. Calcified mitral annulus noted.  No intra-abdominal free air or free fluid.  Layering sludge versus small stones noted within the gallbladder. No pericholecystic fluid. The liver, pancreas, spleen, adrenal glands appear unremarkable. There is mild bilateral renal parenchymal atrophy. There is no hydronephrosis or nephrolithiasis on either side. The visualized ureters and urinary bladder appear unremarkable. The uterus and visualized right ovary appear grossly unremarkable.  There is a large partially visualized hiatal hernia. There is no evidence of bowel obstruction. There is thickening of the rectal wall and rectosigmoid possibly related to proctocolitis. Clinical correlation is recommended. Liquid stool noted within the rectum. The appendix is not visualized with certainty. No inflammatory changes identified in the right lower quadrant.  There is aortoiliac atherosclerotic disease. No portal venous gas identified. There is no adenopathy. Midline vertical anterior pelvic wall incisional scar. There is osteopenia  with degenerative changes of the osseous structures and spine. No acute fracture.  IMPRESSION: Circumferential thickening of the rectosigmoid concerning for proctocolitis. Clinical correlation recommended. No other acute intra-abdominal or pelvic pathology identified.   Electronically Signed   By: Anner Crete M.D.   On: 05/19/2015 23:14   I have personally reviewed and evaluated these images and lab results as part of my medical decision-making.   EKG Interpretation None      MDM   Final diagnoses:  Proctocolitis, without complications  AKI (acute kidney injury)   Patient is an 81 year old female with history of diverticulosis and recent UTI on antibiotics that presents with 6 days of diarrhea  and lower abdominal pain. Pain is worse in the left lower quadrant. Patient states her diarrhea is watery mucus. On arrival to the ED the patient is afebrile mildly tachycardic otherwise vitals are stable. On exam patient has left lower quadrant tenderness. Patient's workup significant for a leukocytosis. Patient appears clinically dehydrated. Patient's BUN is elevated. We will give the patient IV fluid bolus, CT scan abdomen and pelvis, stool studies.   Patient's CT significant for proctocolitis. GI was consult and an recommended starting IV Flagyl. Patient was admitted to hospitalist for further management of care.   Renne Musca, MD 05/20/15 0030  Leonard Schwartz, MD 05/27/15 [phone]

## 2015-05-19 NOTE — ED Notes (Addendum)
Pt reports having diarrhea x 6 days, had lower abd pain. Denies n/v. Pt has been to pcp and had xray, labs and stool sample done, was told to come here today due to elevated WBC and to see dr buccini.

## 2015-05-19 NOTE — ED Notes (Signed)
Patient transported to CT 

## 2015-05-20 DIAGNOSIS — A0472 Enterocolitis due to Clostridium difficile, not specified as recurrent: Secondary | ICD-10-CM

## 2015-05-20 DIAGNOSIS — K5120 Ulcerative (chronic) proctitis without complications: Secondary | ICD-10-CM | POA: Diagnosis not present

## 2015-05-20 DIAGNOSIS — I10 Essential (primary) hypertension: Secondary | ICD-10-CM | POA: Diagnosis not present

## 2015-05-20 DIAGNOSIS — D72829 Elevated white blood cell count, unspecified: Secondary | ICD-10-CM | POA: Diagnosis present

## 2015-05-20 DIAGNOSIS — A047 Enterocolitis due to Clostridium difficile: Secondary | ICD-10-CM | POA: Diagnosis not present

## 2015-05-20 DIAGNOSIS — N183 Chronic kidney disease, stage 3 unspecified: Secondary | ICD-10-CM | POA: Diagnosis present

## 2015-05-20 DIAGNOSIS — D649 Anemia, unspecified: Secondary | ICD-10-CM | POA: Diagnosis present

## 2015-05-20 DIAGNOSIS — I129 Hypertensive chronic kidney disease with stage 1 through stage 4 chronic kidney disease, or unspecified chronic kidney disease: Secondary | ICD-10-CM | POA: Diagnosis present

## 2015-05-20 DIAGNOSIS — K529 Noninfective gastroenteritis and colitis, unspecified: Secondary | ICD-10-CM | POA: Insufficient documentation

## 2015-05-20 DIAGNOSIS — N179 Acute kidney failure, unspecified: Secondary | ICD-10-CM | POA: Diagnosis present

## 2015-05-20 DIAGNOSIS — N189 Chronic kidney disease, unspecified: Secondary | ICD-10-CM | POA: Diagnosis not present

## 2015-05-20 DIAGNOSIS — I2510 Atherosclerotic heart disease of native coronary artery without angina pectoris: Secondary | ICD-10-CM | POA: Diagnosis present

## 2015-05-20 DIAGNOSIS — R197 Diarrhea, unspecified: Secondary | ICD-10-CM

## 2015-05-20 DIAGNOSIS — K219 Gastro-esophageal reflux disease without esophagitis: Secondary | ICD-10-CM | POA: Diagnosis present

## 2015-05-20 DIAGNOSIS — E871 Hypo-osmolality and hyponatremia: Secondary | ICD-10-CM | POA: Diagnosis present

## 2015-05-20 DIAGNOSIS — E860 Dehydration: Secondary | ICD-10-CM | POA: Diagnosis present

## 2015-05-20 LAB — BASIC METABOLIC PANEL
ANION GAP: 8 (ref 5–15)
BUN: 23 mg/dL — ABNORMAL HIGH (ref 6–20)
CALCIUM: 9.3 mg/dL (ref 8.9–10.3)
CO2: 22 mmol/L (ref 22–32)
CREATININE: 1.19 mg/dL — AB (ref 0.44–1.00)
Chloride: 105 mmol/L (ref 101–111)
GFR, EST AFRICAN AMERICAN: 46 mL/min — AB (ref >60)
GFR, EST NON AFRICAN AMERICAN: 40 mL/min — AB (ref >60)
Glucose, Bld: 97 mg/dL (ref 65–99)
Potassium: 4.4 mmol/L (ref 3.5–5.1)
SODIUM: 135 mmol/L (ref 135–145)

## 2015-05-20 LAB — C DIFFICILE QUICK SCREEN W PCR REFLEX
C DIFFICILE (CDIFF) TOXIN: NEGATIVE
C DIFFICLE (CDIFF) ANTIGEN: POSITIVE — AB

## 2015-05-20 LAB — CBC
HCT: 34.3 % — ABNORMAL LOW (ref 36.0–46.0)
Hemoglobin: 11.7 g/dL — ABNORMAL LOW (ref 12.0–15.0)
MCH: 30.2 pg (ref 26.0–34.0)
MCHC: 34.1 g/dL (ref 30.0–36.0)
MCV: 88.6 fL (ref 78.0–100.0)
Platelets: 227 10*3/uL (ref 150–400)
RBC: 3.87 MIL/uL (ref 3.87–5.11)
RDW: 13 % (ref 11.5–15.5)
WBC: 14.6 10*3/uL — AB (ref 4.0–10.5)

## 2015-05-20 MED ORDER — SODIUM CHLORIDE 0.9 % IV SOLN
INTRAVENOUS | Status: DC
  Administered 2015-05-20 – 2015-05-21 (×3): via INTRAVENOUS

## 2015-05-20 MED ORDER — ATORVASTATIN CALCIUM 20 MG PO TABS
20.0000 mg | ORAL_TABLET | Freq: Every day | ORAL | Status: DC
  Administered 2015-05-20 – 2015-05-21 (×2): 20 mg via ORAL
  Filled 2015-05-20 (×2): qty 1

## 2015-05-20 MED ORDER — OMEPRAZOLE 20 MG PO CPDR
20.0000 mg | DELAYED_RELEASE_CAPSULE | Freq: Every day | ORAL | Status: DC
  Administered 2015-05-21: 20 mg via ORAL
  Filled 2015-05-20 (×3): qty 1

## 2015-05-20 MED ORDER — HEPARIN SODIUM (PORCINE) 5000 UNIT/ML IJ SOLN
5000.0000 [IU] | Freq: Three times a day (TID) | INTRAMUSCULAR | Status: DC
  Administered 2015-05-20 – 2015-05-21 (×7): 5000 [IU] via SUBCUTANEOUS
  Filled 2015-05-20 (×6): qty 1

## 2015-05-20 MED ORDER — FENTANYL CITRATE (PF) 100 MCG/2ML IJ SOLN
25.0000 ug | INTRAMUSCULAR | Status: DC | PRN
  Filled 2015-05-20: qty 2

## 2015-05-20 MED ORDER — IRBESARTAN 150 MG PO TABS
150.0000 mg | ORAL_TABLET | Freq: Every day | ORAL | Status: DC
  Administered 2015-05-21: 150 mg via ORAL
  Filled 2015-05-20 (×2): qty 1

## 2015-05-20 MED ORDER — METRONIDAZOLE IN NACL 5-0.79 MG/ML-% IV SOLN
500.0000 mg | Freq: Three times a day (TID) | INTRAVENOUS | Status: DC
  Administered 2015-05-20: 500 mg via INTRAVENOUS
  Filled 2015-05-20 (×3): qty 100

## 2015-05-20 MED ORDER — METRONIDAZOLE IN NACL 5-0.79 MG/ML-% IV SOLN
500.0000 mg | Freq: Once | INTRAVENOUS | Status: AC
  Administered 2015-05-20: 500 mg via INTRAVENOUS
  Filled 2015-05-20: qty 100

## 2015-05-20 MED ORDER — OLMESARTAN MEDOXOMIL-HCTZ 40-25 MG PO TABS: 0.5000 | ORAL_TABLET | Freq: Every day | ORAL | Status: DC

## 2015-05-20 MED ORDER — SIMETHICONE 80 MG PO CHEW
160.0000 mg | CHEWABLE_TABLET | Freq: Four times a day (QID) | ORAL | Status: DC | PRN
  Administered 2015-05-20 – 2015-05-22 (×3): 160 mg via ORAL
  Filled 2015-05-20 (×3): qty 2

## 2015-05-20 MED ORDER — VANCOMYCIN 50 MG/ML ORAL SOLUTION
125.0000 mg | Freq: Four times a day (QID) | ORAL | Status: DC
  Administered 2015-05-20 – 2015-05-22 (×7): 125 mg via ORAL
  Filled 2015-05-20 (×10): qty 2.5

## 2015-05-20 MED ORDER — HYDROCHLOROTHIAZIDE 12.5 MG PO CAPS
12.5000 mg | ORAL_CAPSULE | Freq: Every day | ORAL | Status: DC
  Administered 2015-05-21: 12.5 mg via ORAL
  Filled 2015-05-20 (×2): qty 1

## 2015-05-20 NOTE — Progress Notes (Signed)
[facility] PROGRESS NOTE  Susan Banks BJY:[id number] DOB: 1926/09/03 DOA: 05/19/2015 PCP: Susan Blackbird, MD  Brief Summary  Susan Banks is a 81 y.o. female who just finished a course of ABx therapy for a UTI last week. Patient developed diarrhea on Monday which has worsened over the course of the past week. Now associated with LLQ abdominal pain.   Assessment/Plan  C. difficile diarrhea - Start oral vancomycin - Discontinue IV Flagyl -  Advance diet -  Continue IV fluids and antiemetics  HTN, blood pressure stable -  Continue BP medications  Leukocytosis, resolving  Normocytic anemia, likely hemodilutional and due to acute illness -  No obvious bleeding -  Trend hgb  Hyponatremia and elevated creatinine due to dehydration, improving with IVF.   -  Repeat BMP in AM -  Continue IVF  Diet:  regular Access:  PIV IVF:  yes Proph:  heparin  Code Status: Full code Family Communication: Patient alone Disposition Plan: Pending tolerating by mouth, diarrhea slowing down   Consultants:  none  Procedures:  CT abd/pelvis  Antibiotics:  Flagyl 9/11   Vancomycin oral 9/11 >>   HPI/Subjective:  Diarrhea may be slowing down a little and she would like to eat.  Denies abdominal pain    Objective: Filed Vitals:   05/20/15 0300 05/20/15 0337 05/20/15 0535 05/20/15 1325  BP: 123/67 136/67 145/61 120/87  Pulse: 94 90 97 77  Temp:  97.6 F (36.4 C) 97.9 F (36.6 C) 98.5 F (36.9 C)  TempSrc:  Oral Oral Oral  Resp:    16  Height:      Weight:      SpO2: 94% 96% 97% 97%    Intake/Output Summary (Last 24 hours) at 05/20/15 1815 Last data filed at 05/20/15 1430  Gross per 24 hour  Intake    405 ml  Output      0 ml  Net    405 ml   Filed Weights   05/19/15 1308  Weight: 63.186 kg (139 lb 4.8 oz)   Body mass index is 24.68 kg/(m^2).  Exam:   General:  Confused adult female, No acute distress  HEENT:  NCAT, MMM  Cardiovascular:  RRR, nl  S1, S2 no mrg, 2+ pulses, warm extremities  Respiratory:  CTAB, no increased WOB  Abdomen:   Hyperactive BS, soft, NT/ND  MSK:   Normal tone and bulk, no LEE  Neuro:  Grossly intact  Data Reviewed: Basic Metabolic Panel:  Recent Labs Lab 05/19/15 1313 05/20/15 0625  NA 133* 135  K 4.1 4.4  CL 101 105  CO2 20* 22  GLUCOSE 111* 97  BUN 27* 23*  CREATININE 1.31* 1.19*  CALCIUM 9.8 9.3   Liver Function Tests:  Recent Labs Lab 05/19/15 1313  AST 21  ALT 16  ALKPHOS 133*  BILITOT 0.7  PROT 7.1  ALBUMIN 3.9    Recent Labs Lab 05/19/15 1313  LIPASE 20*   No results for input(s): AMMONIA in the last 168 hours. CBC:  Recent Labs Lab 05/19/15 1313 05/20/15 0625  WBC 16.5* 14.6*  HGB 13.0 11.7*  HCT 38.3 34.3*  MCV 88.9 88.6  PLT 272 227    Recent Results (from the past 240 hour(s))  C difficile quick scan w PCR reflex     Status: Abnormal   Collection Time: 05/20/15  9:52 AM  Result Value Ref Range Status   C Diff antigen POSITIVE (A) NEGATIVE Final   C Diff toxin NEGATIVE  NEGATIVE Final   C Diff interpretation   Final    C. difficile present, but toxin not detected. This indicates colonization. In most cases, this does not require treatment. If patient has signs and symptoms consistent with colitis, consider treatment.     Studies: Ct Abdomen Pelvis Wo Contrast  05/19/2015   CLINICAL DATA:  81 year old female with left lower quadrant abdominal pain  EXAM: CT ABDOMEN AND PELVIS WITHOUT CONTRAST  TECHNIQUE: Multidetector CT imaging of the abdomen and pelvis was performed following the standard protocol without IV contrast.  COMPARISON:  None.  FINDINGS: Evaluation of this exam is limited in the absence of intravenous contrast.  The visualized lung bases are clear. Calcified mitral annulus noted.  No intra-abdominal free air or free fluid.  Layering sludge versus small stones noted within the gallbladder. No pericholecystic fluid. The liver, pancreas, spleen,  adrenal glands appear unremarkable. There is mild bilateral renal parenchymal atrophy. There is no hydronephrosis or nephrolithiasis on either side. The visualized ureters and urinary bladder appear unremarkable. The uterus and visualized right ovary appear grossly unremarkable.  There is a large partially visualized hiatal hernia. There is no evidence of bowel obstruction. There is thickening of the rectal wall and rectosigmoid possibly related to proctocolitis. Clinical correlation is recommended. Liquid stool noted within the rectum. The appendix is not visualized with certainty. No inflammatory changes identified in the right lower quadrant.  There is aortoiliac atherosclerotic disease. No portal venous gas identified. There is no adenopathy. Midline vertical anterior pelvic wall incisional scar. There is osteopenia with degenerative changes of the osseous structures and spine. No acute fracture.  IMPRESSION: Circumferential thickening of the rectosigmoid concerning for proctocolitis. Clinical correlation recommended. No other acute intra-abdominal or pelvic pathology identified.   Electronically Signed   By: Anner Crete M.D.   On: 05/19/2015 23:14    Scheduled Meds: . atorvastatin  20 mg Oral QHS  . heparin  5,000 Units Subcutaneous 3 times per day  . irbesartan  150 mg Oral Daily   And  . hydrochlorothiazide  12.5 mg Oral Daily  . omeprazole  20 mg Oral Daily  . vancomycin  125 mg Oral QID   Continuous Infusions: . sodium chloride 75 mL/hr at 05/20/15 0126    Principal Problem:   Diarrhea Active Problems:   HTN (hypertension)   CKD (chronic kidney disease)    Time spent: 30 min    [name], [facility] Pager [phone]. If 7PM-7AM, please contact night-coverage at [email or web address], password [facility] 05/20/2015, 6:15 PM  LOS: 0 days

## 2015-05-20 NOTE — ED Notes (Signed)
Attempted to call report

## 2015-05-20 NOTE — H&P (Addendum)
[facility] History and Physical  Susan Banks QVZ:[id number] DOB: May 05, 1926 DOA: 05/19/2015  Referring physician: EDP PCP: Antony Blackbird, MD   Chief Complaint: Diarrhea   HPI: Susan Banks is a 81 y.o. female who just finished a course of ABx therapy for a UTI last week.  Patient developed diarrhea on Monday which has worsened over the course of the past week.  Now associated with LLQ abdominal pain.    Review of Systems: Systems reviewed.  As above, otherwise negative  Past Medical History  Diagnosis Date  . Hypertension   . Shortness of breath   . Chronic kidney disease   . GERD (gastroesophageal reflux disease)   . Constipation    Past Surgical History  Procedure Laterality Date  . Tee without cardioversion N/A 06/13/2014    Procedure: TRANSESOPHAGEAL ECHOCARDIOGRAM (TEE);  Surgeon: Laverda Page, MD;  Location: [facility] ENDOSCOPY;  Service: Cardiovascular;  Laterality: N/A;  . Esophagogastroduodenoscopy N/A 07/04/2014    Procedure: ESOPHAGOGASTRODUODENOSCOPY (EGD);  Surgeon: Laverda Page, MD;  Location: [place];  Service: Cardiovascular;  Laterality: N/A;   Social History:  reports that she quit smoking about 31 years ago. She does not have any smokeless tobacco history on file. She reports that she does not drink alcohol or use illicit drugs.  Allergies  Allergen Reactions  . Oxycodone Hives  . Tramadol Hcl Hives  . Hydrocodone-Acetaminophen Rash  . Sulfa Antibiotics Nausea And Vomiting    History reviewed. No pertinent family history.   Prior to Admission medications   Medication Sig Start Date End Date Taking? Authorizing Provider  atorvastatin (LIPITOR) 20 MG tablet Take 20 mg by mouth at bedtime.   Yes Historical Provider, MD  cholecalciferol (VITAMIN D) 1000 UNITS tablet Take 1,000 Units by mouth daily.   Yes Historical Provider, MD  hydrocortisone (ANUSOL-HC) 2.5 % rectal cream Place 1 application rectally 2 (two) times daily.   Yes Historical  Provider, MD  olmesartan-hydrochlorothiazide (BENICAR HCT) 40-25 MG per tablet Take 0.5 tablets by mouth daily with supper.   Yes Historical Provider, MD  omeprazole (PRILOSEC OTC) 20 MG tablet Take 20 mg by mouth daily.   Yes Historical Provider, MD  Polyethyl Glycol-Propyl Glycol (SYSTANE OP) Place 1 drop into both eyes daily as needed (for dry eyes).    Yes Historical Provider, MD  polyethylene glycol (MIRALAX / GLYCOLAX) packet Take 17 g by mouth daily as needed for moderate constipation.    Yes Historical Provider, MD  vitamin E 400 UNIT capsule Take 800 Units by mouth daily.   Yes Historical Provider, MD   Physical Exam: Filed Vitals:   05/19/15 2300  BP: 134/63  Pulse: 95  Temp:   Resp:     BP 134/63 mmHg  Pulse 95  Temp(Src) 97.9 F (36.6 C) (Oral)  Resp 18  Ht 5\' 3"  (1.6 m)  Wt 63.186 kg (139 lb 4.8 oz)  BMI 24.68 kg/m2  SpO2 94%  General Appearance:    Alert, oriented, no distress, appears stated age  Head:    Normocephalic, atraumatic  Eyes:    PERRL, EOMI, sclera non-icteric        Nose:   Nares without drainage or epistaxis. Mucosa, turbinates normal  Throat:   Moist mucous membranes. Oropharynx without erythema or exudate.  Neck:   Supple. No carotid bruits.  No thyromegaly.  No lymphadenopathy.   Back:     No CVA tenderness, no spinal tenderness  Lungs:     Clear to auscultation  bilaterally, without wheezes, rhonchi or rales  Chest wall:    No tenderness to palpitation  Heart:    Regular rate and rhythm without murmurs, gallops, rubs  Abdomen:     Soft, non-tender, nondistended, normal bowel sounds, no organomegaly  Genitalia:    deferred  Rectal:    deferred  Extremities:   No clubbing, cyanosis or edema.  Pulses:   2+ and symmetric all extremities  Skin:   Skin color, texture, turgor normal, no rashes or lesions  Lymph nodes:   Cervical, supraclavicular, and axillary nodes normal  Neurologic:   CNII-XII intact. Normal strength, sensation and reflexes       throughout    Labs on Admission:  Basic Metabolic Panel:  Recent Labs Lab 05/19/15 1313  NA 133*  K 4.1  CL 101  CO2 20*  GLUCOSE 111*  BUN 27*  CREATININE 1.31*  CALCIUM 9.8   Liver Function Tests:  Recent Labs Lab 05/19/15 1313  AST 21  ALT 16  ALKPHOS 133*  BILITOT 0.7  PROT 7.1  ALBUMIN 3.9    Recent Labs Lab 05/19/15 1313  LIPASE 20*   No results for input(s): AMMONIA in the last 168 hours. CBC:  Recent Labs Lab 05/19/15 1313  WBC 16.5*  HGB 13.0  HCT 38.3  MCV 88.9  PLT 272   Cardiac Enzymes: No results for input(s): CKTOTAL, CKMB, CKMBINDEX, TROPONINI in the last 168 hours.  BNP (last 3 results) No results for input(s): PROBNP in the last 8760 hours. CBG: No results for input(s): GLUCAP in the last 168 hours.  Radiological Exams on Admission: Ct Abdomen Pelvis Wo Contrast  05/19/2015   CLINICAL DATA:  81 year old female with left lower quadrant abdominal pain  EXAM: CT ABDOMEN AND PELVIS WITHOUT CONTRAST  TECHNIQUE: Multidetector CT imaging of the abdomen and pelvis was performed following the standard protocol without IV contrast.  COMPARISON:  None.  FINDINGS: Evaluation of this exam is limited in the absence of intravenous contrast.  The visualized lung bases are clear. Calcified mitral annulus noted.  No intra-abdominal free air or free fluid.  Layering sludge versus small stones noted within the gallbladder. No pericholecystic fluid. The liver, pancreas, spleen, adrenal glands appear unremarkable. There is mild bilateral renal parenchymal atrophy. There is no hydronephrosis or nephrolithiasis on either side. The visualized ureters and urinary bladder appear unremarkable. The uterus and visualized right ovary appear grossly unremarkable.  There is a large partially visualized hiatal hernia. There is no evidence of bowel obstruction. There is thickening of the rectal wall and rectosigmoid possibly related to proctocolitis. Clinical correlation is  recommended. Liquid stool noted within the rectum. The appendix is not visualized with certainty. No inflammatory changes identified in the right lower quadrant.  There is aortoiliac atherosclerotic disease. No portal venous gas identified. There is no adenopathy. Midline vertical anterior pelvic wall incisional scar. There is osteopenia with degenerative changes of the osseous structures and spine. No acute fracture.  IMPRESSION: Circumferential thickening of the rectosigmoid concerning for proctocolitis. Clinical correlation recommended. No other acute intra-abdominal or pelvic pathology identified.   Electronically Signed   By: Anner Crete M.D.   On: 05/19/2015 23:14    EKG: Independently reviewed.  Assessment/Plan Principal Problem:   Diarrhea Active Problems:   HTN (hypertension)   CKD (chronic kidney disease)   1. Diarrhea - 1. Gentle hydration 2. Empiric flagyl 3. Stool for C.Diff is pending 2. HTN - continue home meds 3. CKD - Baseline kidney function is  unknown but she does have a h/o CKD in her PMH section, so presumably her current numbers are close to baseline as they are barely elevated 1. Recheck BMP in AM    Code Status: Full Code  Family Communication: Family at bedside Disposition Plan: Admit to obs   Time spent: 50 min  GARDNER, JARED M. [facility] Pager [phone]  If 7AM-7PM, please contact the day team taking care of the patient [email or web address] Password [email or web address] 05/20/2015, 1:16 AM

## 2015-05-21 DIAGNOSIS — A047 Enterocolitis due to Clostridium difficile: Principal | ICD-10-CM

## 2015-05-21 DIAGNOSIS — N189 Chronic kidney disease, unspecified: Secondary | ICD-10-CM

## 2015-05-21 DIAGNOSIS — I10 Essential (primary) hypertension: Secondary | ICD-10-CM

## 2015-05-21 LAB — CBC
HCT: 33.8 % — ABNORMAL LOW (ref 36.0–46.0)
HEMOGLOBIN: 11.2 g/dL — AB (ref 12.0–15.0)
MCH: 29.5 pg (ref 26.0–34.0)
MCHC: 33.1 g/dL (ref 30.0–36.0)
MCV: 88.9 fL (ref 78.0–100.0)
Platelets: 230 10*3/uL (ref 150–400)
RBC: 3.8 MIL/uL — AB (ref 3.87–5.11)
RDW: 12.9 % (ref 11.5–15.5)
WBC: 8.7 10*3/uL (ref 4.0–10.5)

## 2015-05-21 LAB — BASIC METABOLIC PANEL
ANION GAP: 9 (ref 5–15)
BUN: 21 mg/dL — ABNORMAL HIGH (ref 6–20)
CALCIUM: 9 mg/dL (ref 8.9–10.3)
CHLORIDE: 107 mmol/L (ref 101–111)
CO2: 19 mmol/L — AB (ref 22–32)
Creatinine, Ser: 1.14 mg/dL — ABNORMAL HIGH (ref 0.44–1.00)
GFR calc non Af Amer: 42 mL/min — ABNORMAL LOW (ref >60)
GFR, EST AFRICAN AMERICAN: 49 mL/min — AB (ref >60)
Glucose, Bld: 80 mg/dL (ref 65–99)
POTASSIUM: 3.8 mmol/L (ref 3.5–5.1)
Sodium: 135 mmol/L (ref 135–145)

## 2015-05-21 MED ORDER — ENSURE ENLIVE PO LIQD
237.0000 mL | Freq: Two times a day (BID) | ORAL | Status: DC
  Administered 2015-05-21: 237 mL via ORAL

## 2015-05-21 NOTE — Progress Notes (Signed)
Initial Nutrition Assessment  DOCUMENTATION CODES:   Not applicable  INTERVENTION:   Provide Ensure Enlive po BID, each supplement provides 350 kcal and 20 grams of protein.  Encourage adequate PO intake.   NUTRITION DIAGNOSIS:   Inadequate oral intake related to poor appetite as evidenced by meal completion < 50%.  GOAL:   Patient will meet greater than or equal to 90% of their needs  MONITOR:   PO intake, Supplement acceptance, Weight trends, Labs, I & O's  REASON FOR ASSESSMENT:   Malnutrition Screening Tool    ASSESSMENT:   81 y.o. female who just finished a course of ABx therapy for a UTI last week. Patient developed diarrhea on Monday which has worsened over the course of the past week. Now associated with LLQ abdominal pain.   Meal completion has been 50%. Pt reports having a decreased appetite since the 2 days prior to admission. Prior to abdominal pains, pt reports eating well with at least 2 meals daily. Weight has been stable. Pt is agreeable to Ensure to aid in caloric and protein needs. RD to order. Pt was encouraged to eat her food at meals.   Pt with no observed significant fat or muscle mass loss.  Labs and medications reviewed.   Diet Order:  Diet regular Room service appropriate?: Yes; Fluid consistency:: Thin  Skin:  Wound (see comment) (wound on anus)  Last BM:  9/11  Height:   Ht Readings from Last 1 Encounters:  05/19/15 5\' 3"  (1.6 m)    Weight:   Wt Readings from Last 1 Encounters:  05/19/15 139 lb 4.8 oz (63.186 kg)    Ideal Body Weight:  52.27 kg  BMI:  Body mass index is 24.68 kg/(m^2).  Estimated Nutritional Needs:   Kcal:  1600-1850  Protein:  75-85 grams  Fluid:  1.6 - 1.8 L/day  EDUCATION NEEDS:   No education needs identified at this time  Corrin Parker, MS, RD, LDN Pager # [phone] After hours/ weekend pager # [phone]

## 2015-05-21 NOTE — Progress Notes (Signed)
[facility] PROGRESS NOTE  Susan Banks VVO:[id number] DOB: Aug 07, 1926 DOA: 05/19/2015 PCP: Antony Blackbird, MD  Brief Summary  Susan Banks is a 81 y.o. female who just finished a course of ABx therapy for a UTI last week. Patient developed diarrhea on Monday which has worsened over the course of the past week. Now associated with LLQ abdominal pain.   Assessment/Plan  C. difficile diarrhea, less fecal incontinence, stools slowing down some - Started oral vancomycin on 9/11 -  Continue IV fluids and antiemetics  HTN, blood pressure stable -  Continue BP medications  Leukocytosis, resolved  Normocytic anemia, likely hemodilutional and due to acute illness -  No obvious bleeding -  Trend hgb  Hyponatremia and elevated creatinine due to dehydration, improved with IVF.   -  Repeat BMP in AM -  Continue IVF  Diet:  regular Access:  PIV IVF:  yes Proph:  heparin  Code Status: Full code Family Communication: Patient alone Disposition Plan: Pending tolerating by mouth, diarrhea slowing down.  Likely home tomorrow   Consultants:  none  Procedures:  CT abd/pelvis  Antibiotics:  Flagyl 9/11   Vancomycin oral 9/11 >>   HPI/Subjective:  Less stool incontinence. Did not eat or drink much yesterday.  Denies abdominal pain    Objective: Filed Vitals:   05/20/15 1325 05/20/15 2204 05/21/15 0606 05/21/15 1512  BP: 120/87 149/67 148/59 136/67  Pulse: 77 85 88 85  Temp: 98.5 F (36.9 C) 97.5 F (36.4 C) 97.5 F (36.4 C) 97.8 F (36.6 C)  TempSrc: Oral Oral Oral Oral  Resp: 16 16 16 16   Height:      Weight:      SpO2: 97% 97% 97% 97%    Intake/Output Summary (Last 24 hours) at 05/21/15 1739 Last data filed at 05/21/15 1500  Gross per 24 hour  Intake 1101.25 ml  Output    300 ml  Net 801.25 ml   Filed Weights   05/19/15 1308  Weight: 63.186 kg (139 lb 4.8 oz)   Body mass index is 24.68 kg/(m^2).  Exam:   General:  Confused adult female, No  acute distress  HEENT:  NCAT, MMM  Cardiovascular:  RRR, nl S1, S2 no mrg, 2+ pulses, warm extremities  Respiratory:  CTAB, no increased WOB  Abdomen:   Hyperactive BS, soft, NT/ND  MSK:   Normal tone and bulk, no LEE  Neuro:  Grossly intact  Data Reviewed: Basic Metabolic Panel:  Recent Labs Lab 05/19/15 1313 05/20/15 0625 05/21/15 0412  NA 133* 135 135  K 4.1 4.4 3.8  CL 101 105 107  CO2 20* 22 19*  GLUCOSE 111* 97 80  BUN 27* 23* 21*  CREATININE 1.31* 1.19* 1.14*  CALCIUM 9.8 9.3 9.0   Liver Function Tests:  Recent Labs Lab 05/19/15 1313  AST 21  ALT 16  ALKPHOS 133*  BILITOT 0.7  PROT 7.1  ALBUMIN 3.9    Recent Labs Lab 05/19/15 1313  LIPASE 20*   No results for input(s): AMMONIA in the last 168 hours. CBC:  Recent Labs Lab 05/19/15 1313 05/20/15 0625 05/21/15 0412  WBC 16.5* 14.6* 8.7  HGB 13.0 11.7* 11.2*  HCT 38.3 34.3* 33.8*  MCV 88.9 88.6 88.9  PLT 272 227 230    Recent Results (from the past 240 hour(s))  C difficile quick scan w PCR reflex     Status: Abnormal   Collection Time: 05/20/15  9:52 AM  Result Value Ref Range Status  C Diff antigen POSITIVE (A) NEGATIVE Final   C Diff toxin NEGATIVE NEGATIVE Final   C Diff interpretation   Final    C. difficile present, but toxin not detected. This indicates colonization. In most cases, this does not require treatment. If patient has signs and symptoms consistent with colitis, consider treatment.     Studies: Ct Abdomen Pelvis Wo Contrast  05/19/2015   CLINICAL DATA:  81 year old female with left lower quadrant abdominal pain  EXAM: CT ABDOMEN AND PELVIS WITHOUT CONTRAST  TECHNIQUE: Multidetector CT imaging of the abdomen and pelvis was performed following the standard protocol without IV contrast.  COMPARISON:  None.  FINDINGS: Evaluation of this exam is limited in the absence of intravenous contrast.  The visualized lung bases are clear. Calcified mitral annulus noted.  No  intra-abdominal free air or free fluid.  Layering sludge versus small stones noted within the gallbladder. No pericholecystic fluid. The liver, pancreas, spleen, adrenal glands appear unremarkable. There is mild bilateral renal parenchymal atrophy. There is no hydronephrosis or nephrolithiasis on either side. The visualized ureters and urinary bladder appear unremarkable. The uterus and visualized right ovary appear grossly unremarkable.  There is a large partially visualized hiatal hernia. There is no evidence of bowel obstruction. There is thickening of the rectal wall and rectosigmoid possibly related to proctocolitis. Clinical correlation is recommended. Liquid stool noted within the rectum. The appendix is not visualized with certainty. No inflammatory changes identified in the right lower quadrant.  There is aortoiliac atherosclerotic disease. No portal venous gas identified. There is no adenopathy. Midline vertical anterior pelvic wall incisional scar. There is osteopenia with degenerative changes of the osseous structures and spine. No acute fracture.  IMPRESSION: Circumferential thickening of the rectosigmoid concerning for proctocolitis. Clinical correlation recommended. No other acute intra-abdominal or pelvic pathology identified.   Electronically Signed   By: Anner Crete M.D.   On: 05/19/2015 23:14    Scheduled Meds: . atorvastatin  20 mg Oral QHS  . feeding supplement (ENSURE ENLIVE)  237 mL Oral BID BM  . heparin  5,000 Units Subcutaneous 3 times per day  . irbesartan  150 mg Oral Daily   And  . hydrochlorothiazide  12.5 mg Oral Daily  . omeprazole  20 mg Oral Daily  . vancomycin  125 mg Oral QID   Continuous Infusions: . sodium chloride 75 mL/hr at 05/21/15 1129    Principal Problem:   C. difficile colitis Active Problems:   Diarrhea   HTN (hypertension)   CKD (chronic kidney disease)    Time spent: 30 min    Glenice Ciccone, [facility] Pager [phone]. If  7PM-7AM, please contact night-coverage at [email or web address], password [facility] 05/21/2015, 5:39 PM  LOS: 1 day

## 2015-05-22 DIAGNOSIS — K219 Gastro-esophageal reflux disease without esophagitis: Secondary | ICD-10-CM

## 2015-05-22 DIAGNOSIS — D649 Anemia, unspecified: Secondary | ICD-10-CM

## 2015-05-22 DIAGNOSIS — E871 Hypo-osmolality and hyponatremia: Secondary | ICD-10-CM

## 2015-05-22 LAB — CBC
HCT: 32.4 % — ABNORMAL LOW (ref 36.0–46.0)
Hemoglobin: 10.6 g/dL — ABNORMAL LOW (ref 12.0–15.0)
MCH: 29 pg (ref 26.0–34.0)
MCHC: 32.7 g/dL (ref 30.0–36.0)
MCV: 88.5 fL (ref 78.0–100.0)
PLATELETS: 239 10*3/uL (ref 150–400)
RBC: 3.66 MIL/uL — ABNORMAL LOW (ref 3.87–5.11)
RDW: 13 % (ref 11.5–15.5)
WBC: 8.3 10*3/uL (ref 4.0–10.5)

## 2015-05-22 LAB — BASIC METABOLIC PANEL
Anion gap: 7 (ref 5–15)
BUN: 17 mg/dL (ref 6–20)
CALCIUM: 8.9 mg/dL (ref 8.9–10.3)
CO2: 21 mmol/L — ABNORMAL LOW (ref 22–32)
Chloride: 106 mmol/L (ref 101–111)
Creatinine, Ser: 1.06 mg/dL — ABNORMAL HIGH (ref 0.44–1.00)
GFR calc Af Amer: 53 mL/min — ABNORMAL LOW (ref >60)
GFR, EST NON AFRICAN AMERICAN: 46 mL/min — AB (ref >60)
GLUCOSE: 102 mg/dL — AB (ref 65–99)
POTASSIUM: 3.7 mmol/L (ref 3.5–5.1)
SODIUM: 134 mmol/L — AB (ref 135–145)

## 2015-05-22 LAB — GI PATHOGEN PANEL BY PCR, STOOL
Campylobacter by PCR: NOT DETECTED
Cryptosporidium by PCR: NOT DETECTED
E COLI 0157 BY PCR: NOT DETECTED
E coli (ETEC) LT/ST: NOT DETECTED
E coli (STEC): NOT DETECTED
G lamblia by PCR: NOT DETECTED
Norovirus GI/GII: NOT DETECTED
Rotavirus A by PCR: NOT DETECTED
SALMONELLA BY PCR: NOT DETECTED
SHIGELLA BY PCR: NOT DETECTED

## 2015-05-22 MED ORDER — VANCOMYCIN 50 MG/ML ORAL SOLUTION: 125.0000 mg | Freq: Four times a day (QID) | ORAL | Status: DC

## 2015-05-22 MED ORDER — SIMETHICONE 80 MG PO CHEW: 160.0000 mg | CHEWABLE_TABLET | Freq: Four times a day (QID) | ORAL | Status: DC | PRN

## 2015-05-22 MED ORDER — SACCHAROMYCES BOULARDII 250 MG PO CAPS: 250.0000 mg | ORAL_CAPSULE | Freq: Two times a day (BID) | ORAL | Status: DC

## 2015-05-22 NOTE — Discharge Summary (Addendum)
Physician Discharge Summary  Susan Banks ACZ:[id number] DOB: [birth year] DOA: 05/19/2015  PCP: Antony Blackbird, MD  Admit date: 05/19/2015 Discharge date: 05/22/2015  Recommendations for Outpatient Follow-up:  1. F/u with gastroenterologist in 1-2 weeks for repeat CBC.  Advised to return to the hospital if she has worsening rectal bleeding.    Discharge Diagnoses:  Principal Problem:   C. difficile colitis Active Problems:   Diarrhea   HTN (hypertension)   Chronic kidney disease, stage 3   Hyponatremia   Normocytic anemia   GERD (gastroesophageal reflux disease)   Discharge Condition: stable, improved  Diet recommendation: regular  Wt Readings from Last 3 Encounters:  05/19/15 63.186 kg (139 lb 4.8 oz)  06/13/14 63.05 kg (139 lb)    History of present illness:   Susan Banks is a 81 y.o. female who just finished a course of ABx therapy for a UTI the week prior to admission. Patient developed diarrhea on Monday which worsened over the week. She was unable to eat or drink. She developed some left lower quadrant abdominal pain and presented to the emergency department.   Hospital Course:   C. difficile diarrhea, likely related to her recent course of antibiotics. She was started on IV flagyl initially for a few doses until she was able to tolerate by mouth, then she was started on oral vancomycin. Her diarrhea slowed and she had reduced his fecal incontinence. She did have some streaks of blood in her stools, but she has history of hemorrhoids it did not have any large bloody stools or melanotic.  I have asked her to stop her PPI and the gastroenterologists have asked her to start Florastor.    Chronic kidney disease stage 3, creatinine of 1.3 which trended down to 1.06 with IV fluids.  Normocytic anemia, hgb drifted down to 10.6mg /dl.  This was likely hemodilution and due to marrow suppression from acute illness. She only had a small amount of rectal bleeding on the day of  discharge (streaks at end of BM) and I doubt that this was the cause of her anemia.  Recommend she return to hospital if she has severe bleeding and have repeat CBC in 1 week by gastroenterologist who can perform further evaluation if bleeding or anemia are worsening.    Hyponatremia, likely secondary to dehydration and improved with IV fluids but may have persisent hyponatremia due to HCTZ use.  Recommend repeat BMP in approximately one week by her primary care doctor.  Leukocytosis due to C. Diff colitis, resolved with antibiotics.  Hypertension, her blood pressure was stable, she continued her home blood pressure medications.  Consultants:  none  Procedures:  CT abd/pelvis  Antibiotics:  Flagyl 9/11   Vancomycin oral 9/11 >>  Discharge Exam: Filed Vitals:   05/22/15 0603  BP: 132/70  Pulse: 78  Temp: 97.8 F (36.6 C)  Resp: 18   Filed Vitals:   05/21/15 0606 05/21/15 1512 05/21/15 2254 05/22/15 0603  BP: 148/59 136/67 130/60 132/70  Pulse: 88 85 74 78  Temp: 97.5 F (36.4 C) 97.8 F (36.6 C) 98.1 F (36.7 C) 97.8 F (36.6 C)  TempSrc: Oral Oral Oral Oral  Resp: 16 16 18 18   Height:      Weight:      SpO2: 97% 97% 98% 98%     General: Confused adult female, No acute distress  HEENT: NCAT, MMM  Cardiovascular: RRR, nl S1, S2 no mrg, 2+ pulses, warm extremities  Respiratory: CTAB, no increased WOB  Abdomen: Hyperactive BS, soft, NT/ND  MSK: Normal tone and bulk, no LEE  Neuro: Grossly intact  Discharge Instructions      Discharge Instructions    Call MD for:  difficulty breathing, headache or visual disturbances    Complete by:  As directed      Call MD for:  extreme fatigue    Complete by:  As directed      Call MD for:  hives    Complete by:  As directed      Call MD for:  persistant dizziness or light-headedness    Complete by:  As directed      Call MD for:  persistant nausea and vomiting    Complete by:  As directed      Call  MD for:  severe uncontrolled pain    Complete by:  As directed      Call MD for:  temperature >100.4    Complete by:  As directed      Diet - low sodium heart healthy    Complete by:  As directed      Discharge instructions    Complete by:  As directed   You were hospitalized C. Diff diarrhea.  Please stop taking your acid medication for the next few weeks.  You may use TUMs if needed for heart burn.  Take vancomycin four times daily and follow up with gastroenterology in about 1 week.  If you have severe bleeding, worsening fevers, diarrhea, or become dehydrated, please return to the hospital right away.     Increase activity slowly    Complete by:  As directed             Medication List    STOP taking these medications        omeprazole 20 MG tablet  Commonly known as:  PRILOSEC OTC     polyethylene glycol packet  Commonly known as:  MIRALAX / GLYCOLAX      TAKE these medications        atorvastatin 20 MG tablet  Commonly known as:  LIPITOR  Take 20 mg by mouth at bedtime.     cholecalciferol 1000 UNITS tablet  Commonly known as:  VITAMIN D  Take 1,000 Units by mouth daily.     hydrocortisone 2.5 % rectal cream  Commonly known as:  ANUSOL-HC  Place 1 application rectally 2 (two) times daily.     olmesartan-hydrochlorothiazide 40-25 MG per tablet  Commonly known as:  BENICAR HCT  Take 0.5 tablets by mouth daily with supper.     saccharomyces boulardii 250 MG capsule  Commonly known as:  FLORASTOR  Take 1 capsule (250 mg total) by mouth 2 (two) times daily.     simethicone 80 MG chewable tablet  Commonly known as:  MYLICON  Chew 2 tablets (160 mg total) by mouth 4 (four) times daily as needed for flatulence.     SYSTANE OP  Place 1 drop into both eyes daily as needed (for dry eyes).     vancomycin 50 mg/mL oral solution  Commonly known as:  VANCOCIN  Take 2.5 mLs (125 mg total) by mouth 4 (four) times daily.     vitamin E 400 UNIT capsule  Take 800 Units  by mouth daily.       Follow-up Information    Schedule an appointment as soon as possible for a visit with FULP, CAMMIE, MD.   Specialty:  Family Medicine   Why:  As needed  Contact information:   [address] [place] [address] [phone]       Follow up with Cleotis Nipper, MD. Schedule an appointment as soon as possible for a visit in 1 week.   Specialty:  Gastroenterology   Contact information:   [address] [place] [address] [phone]        The results of significant diagnostics from this hospitalization (including imaging, microbiology, ancillary and laboratory) are listed below for reference.    Significant Diagnostic Studies: Ct Abdomen Pelvis Wo Contrast  05/19/2015   CLINICAL DATA:  81 year old female with left lower quadrant abdominal pain  EXAM: CT ABDOMEN AND PELVIS WITHOUT CONTRAST  TECHNIQUE: Multidetector CT imaging of the abdomen and pelvis was performed following the standard protocol without IV contrast.  COMPARISON:  None.  FINDINGS: Evaluation of this exam is limited in the absence of intravenous contrast.  The visualized lung bases are clear. Calcified mitral annulus noted.  No intra-abdominal free air or free fluid.  Layering sludge versus small stones noted within the gallbladder. No pericholecystic fluid. The liver, pancreas, spleen, adrenal glands appear unremarkable. There is mild bilateral renal parenchymal atrophy. There is no hydronephrosis or nephrolithiasis on either side. The visualized ureters and urinary bladder appear unremarkable. The uterus and visualized right ovary appear grossly unremarkable.  There is a large partially visualized hiatal hernia. There is no evidence of bowel obstruction. There is thickening of the rectal wall and rectosigmoid possibly related to proctocolitis. Clinical correlation is recommended. Liquid stool noted within the rectum. The appendix is not visualized with certainty. No  inflammatory changes identified in the right lower quadrant.  There is aortoiliac atherosclerotic disease. No portal venous gas identified. There is no adenopathy. Midline vertical anterior pelvic wall incisional scar. There is osteopenia with degenerative changes of the osseous structures and spine. No acute fracture.  IMPRESSION: Circumferential thickening of the rectosigmoid concerning for proctocolitis. Clinical correlation recommended. No other acute intra-abdominal or pelvic pathology identified.   Electronically Signed   By: Anner Crete M.D.   On: 05/19/2015 23:14   Dg Epidurography  05/07/2015   CLINICAL DATA:  Lumbosacral spondylosis without myelopathy. Right greater than left-sided low back pain. No reported radicular symptoms. MRI demonstrates scoliosis and diffuse lumbar disc degeneration with mild-to-moderate spinal stenosis at L4-5.  FLUOROSCOPY TIME:  Radiation Exposure Index (as provided by the fluoroscopic device): 84.40 microGray*m^2  PROCEDURE: The procedure, risks, benefits, and alternatives were explained to the patient. Questions regarding the procedure were encouraged and answered. The patient understands and consents to the procedure.  LUMBAR EPIDURAL INJECTION:  An interlaminar approach was performed on the right at L4-5. The overlying skin was cleansed and anesthetized. A 20 gauge spinal needle was advanced using loss-of-resistance technique.  DIAGNOSTIC EPIDURAL INJECTION:  Injection of Omnipaque 180 shows a good epidural pattern with spread above and below the level of needle placement, primarily on the right. No vascular opacification is seen.  THERAPEUTIC EPIDURAL INJECTION:  120 mg of Depo-Medrol mixed with 3 mL of 1% lidocaine were instilled. The procedure was well-tolerated, and the patient was discharged thirty minutes following the injection in good condition.  COMPLICATIONS: None  IMPRESSION: Technically successful lumbar interlaminar epidural injection on the right at  L4-5.   Electronically Signed   By: Logan Bores M.D.   On: 05/07/2015 11:54   Mm Screening Breast Tomo Bilateral  05/11/2015   CLINICAL DATA:  Screening.  EXAM: DIGITAL SCREENING BILATERAL MAMMOGRAM WITH 3D TOMO WITH  CAD  COMPARISON:  Previous exam(s).  ACR Breast Density Category c: The breast tissue is heterogeneously dense, which may obscure small masses.  FINDINGS: There are no findings suspicious for malignancy. Images were processed with CAD.  IMPRESSION: No mammographic evidence of malignancy. A result letter of this screening mammogram will be mailed directly to the patient.  RECOMMENDATION: Screening mammogram in one year. (Code:[id number])  BI-RADS CATEGORY  1: Negative.   Electronically Signed   By: Everlean Alstrom M.D.   On: 05/11/2015 07:54    Microbiology: Recent Results (from the past 240 hour(s))  C difficile quick scan w PCR reflex     Status: Abnormal   Collection Time: 05/20/15  9:52 AM  Result Value Ref Range Status   C Diff antigen POSITIVE (A) NEGATIVE Final   C Diff toxin NEGATIVE NEGATIVE Final   C Diff interpretation   Final    C. difficile present, but toxin not detected. This indicates colonization. In most cases, this does not require treatment. If patient has signs and symptoms consistent with colitis, consider treatment.     Labs: Basic Metabolic Panel:  Recent Labs Lab 05/19/15 1313 05/20/15 0625 05/21/15 0412 05/22/15 0413  NA 133* 135 135 134*  K 4.1 4.4 3.8 3.7  CL 101 105 107 106  CO2 20* 22 19* 21*  GLUCOSE 111* 97 80 102*  BUN 27* 23* 21* 17  CREATININE 1.31* 1.19* 1.14* 1.06*  CALCIUM 9.8 9.3 9.0 8.9   Liver Function Tests:  Recent Labs Lab 05/19/15 1313  AST 21  ALT 16  ALKPHOS 133*  BILITOT 0.7  PROT 7.1  ALBUMIN 3.9    Recent Labs Lab 05/19/15 1313  LIPASE 20*   No results for input(s): AMMONIA in the last 168 hours. CBC:  Recent Labs Lab 05/19/15 1313 05/20/15 0625 05/21/15 0412 05/22/15 0413  WBC 16.5* 14.6* 8.7  8.3  HGB 13.0 11.7* 11.2* 10.6*  HCT 38.3 34.3* 33.8* 32.4*  MCV 88.9 88.6 88.9 88.5  PLT 272 227 230 239   Cardiac Enzymes: No results for input(s): CKTOTAL, CKMB, CKMBINDEX, TROPONINI in the last 168 hours. BNP: BNP (last 3 results) No results for input(s): BNP in the last 8760 hours.  ProBNP (last 3 results) No results for input(s): PROBNP in the last 8760 hours.  CBG: No results for input(s): GLUCAP in the last 168 hours.  Time coordinating discharge: 35 minutes  Signed:  Ashling Roane  [facility] 05/22/2015, 10:00 AM

## 2015-05-22 NOTE — Progress Notes (Signed)
Had a small amt. Of blood streaked stool this am.

## 2015-05-22 NOTE — Progress Notes (Signed)
Pt discharged to home.  Discharge instructions explained to pt and daughter.  Pt has no questions at the time of discharge.  Only request was to change pharmacy to where medication has been sent to.  Pt states she has all belongings.  Pt did not have an IV at the time of discharge.  Pt taken off the floor by volunteer services.

## 2015-06-13 ENCOUNTER — Encounter (HOSPITAL_COMMUNITY): Payer: Self-pay | Admitting: *Deleted

## 2015-06-13 ENCOUNTER — Emergency Department (HOSPITAL_COMMUNITY): Payer: Medicare Other

## 2015-06-13 ENCOUNTER — Inpatient Hospital Stay (HOSPITAL_COMMUNITY)
Admission: EM | Admit: 2015-06-13 | Discharge: 2015-06-16 | DRG: 071 | Disposition: A | Discharge status: Home / Self Care | Payer: Medicare Other | Attending: Internal Medicine | Admitting: Internal Medicine

## 2015-06-13 DIAGNOSIS — I10 Essential (primary) hypertension: Secondary | ICD-10-CM | POA: Diagnosis present

## 2015-06-13 DIAGNOSIS — M549 Dorsalgia, unspecified: Secondary | ICD-10-CM | POA: Diagnosis present

## 2015-06-13 DIAGNOSIS — Z87891 Personal history of nicotine dependence: Secondary | ICD-10-CM

## 2015-06-13 DIAGNOSIS — R4182 Altered mental status, unspecified: Secondary | ICD-10-CM | POA: Diagnosis present

## 2015-06-13 DIAGNOSIS — R410 Disorientation, unspecified: Secondary | ICD-10-CM | POA: Diagnosis not present

## 2015-06-13 DIAGNOSIS — N182 Chronic kidney disease, stage 2 (mild): Secondary | ICD-10-CM | POA: Diagnosis present

## 2015-06-13 DIAGNOSIS — Z885 Allergy status to narcotic agent status: Secondary | ICD-10-CM

## 2015-06-13 DIAGNOSIS — Z882 Allergy status to sulfonamides status: Secondary | ICD-10-CM

## 2015-06-13 DIAGNOSIS — G8929 Other chronic pain: Secondary | ICD-10-CM | POA: Diagnosis present

## 2015-06-13 DIAGNOSIS — E871 Hypo-osmolality and hyponatremia: Secondary | ICD-10-CM | POA: Diagnosis present

## 2015-06-13 DIAGNOSIS — E785 Hyperlipidemia, unspecified: Secondary | ICD-10-CM | POA: Diagnosis present

## 2015-06-13 DIAGNOSIS — M1990 Unspecified osteoarthritis, unspecified site: Secondary | ICD-10-CM | POA: Diagnosis present

## 2015-06-13 DIAGNOSIS — Z79899 Other long term (current) drug therapy: Secondary | ICD-10-CM

## 2015-06-13 DIAGNOSIS — R6510 Systemic inflammatory response syndrome (SIRS) of non-infectious origin without acute organ dysfunction: Secondary | ICD-10-CM | POA: Diagnosis present

## 2015-06-13 DIAGNOSIS — A0472 Enterocolitis due to Clostridium difficile, not specified as recurrent: Secondary | ICD-10-CM | POA: Diagnosis present

## 2015-06-13 DIAGNOSIS — K5900 Constipation, unspecified: Secondary | ICD-10-CM | POA: Diagnosis present

## 2015-06-13 DIAGNOSIS — E7800 Pure hypercholesterolemia, unspecified: Secondary | ICD-10-CM | POA: Diagnosis present

## 2015-06-13 DIAGNOSIS — K219 Gastro-esophageal reflux disease without esophagitis: Secondary | ICD-10-CM | POA: Diagnosis present

## 2015-06-13 DIAGNOSIS — R509 Fever, unspecified: Secondary | ICD-10-CM | POA: Diagnosis present

## 2015-06-13 DIAGNOSIS — G9341 Metabolic encephalopathy: Principal | ICD-10-CM | POA: Diagnosis present

## 2015-06-13 DIAGNOSIS — I129 Hypertensive chronic kidney disease with stage 1 through stage 4 chronic kidney disease, or unspecified chronic kidney disease: Secondary | ICD-10-CM | POA: Diagnosis present

## 2015-06-13 HISTORY — DX: Pure hypercholesterolemia, unspecified: E78.00

## 2015-06-13 LAB — COMPREHENSIVE METABOLIC PANEL
ALK PHOS: 136 U/L — AB (ref 38–126)
ALT: 35 U/L (ref 14–54)
AST: 35 U/L (ref 15–41)
Albumin: 4.3 g/dL (ref 3.5–5.0)
Anion gap: 12 (ref 5–15)
BILIRUBIN TOTAL: 0.3 mg/dL (ref 0.3–1.2)
BUN: 23 mg/dL — AB (ref 6–20)
CALCIUM: 10 mg/dL (ref 8.9–10.3)
CO2: 23 mmol/L (ref 22–32)
Chloride: 91 mmol/L — ABNORMAL LOW (ref 101–111)
Creatinine, Ser: 1.16 mg/dL — ABNORMAL HIGH (ref 0.44–1.00)
GFR calc Af Amer: 48 mL/min — ABNORMAL LOW (ref >60)
GFR, EST NON AFRICAN AMERICAN: 41 mL/min — AB (ref >60)
Glucose, Bld: 155 mg/dL — ABNORMAL HIGH (ref 65–99)
POTASSIUM: 4.1 mmol/L (ref 3.5–5.1)
Sodium: 126 mmol/L — ABNORMAL LOW (ref 135–145)
TOTAL PROTEIN: 7.3 g/dL (ref 6.5–8.1)

## 2015-06-13 LAB — CBC
HEMATOCRIT: 36.2 % (ref 36.0–46.0)
Hemoglobin: 12.3 g/dL (ref 12.0–15.0)
MCH: 29.4 pg (ref 26.0–34.0)
MCHC: 34 g/dL (ref 30.0–36.0)
MCV: 86.6 fL (ref 78.0–100.0)
PLATELETS: 315 10*3/uL (ref 150–400)
RBC: 4.18 MIL/uL (ref 3.87–5.11)
RDW: 12.7 % (ref 11.5–15.5)
WBC: 11.9 10*3/uL — AB (ref 4.0–10.5)

## 2015-06-13 LAB — DIFFERENTIAL
BASOS PCT: 0 %
Basophils Absolute: 0 10*3/uL (ref 0.0–0.1)
EOS ABS: 0 10*3/uL (ref 0.0–0.7)
EOS PCT: 0 %
Lymphocytes Relative: 10 %
Lymphs Abs: 1.2 10*3/uL (ref 0.7–4.0)
MONO ABS: 0.8 10*3/uL (ref 0.1–1.0)
Monocytes Relative: 7 %
Neutro Abs: 9.8 10*3/uL — ABNORMAL HIGH (ref 1.7–7.7)
Neutrophils Relative %: 83 %

## 2015-06-13 LAB — URINALYSIS, ROUTINE W REFLEX MICROSCOPIC
BILIRUBIN URINE: NEGATIVE
GLUCOSE, UA: NEGATIVE mg/dL
HGB URINE DIPSTICK: NEGATIVE
KETONES UR: NEGATIVE mg/dL
Leukocytes, UA: NEGATIVE
Nitrite: NEGATIVE
PROTEIN: NEGATIVE mg/dL
Specific Gravity, Urine: 1.016 (ref 1.005–1.030)
Urobilinogen, UA: 0.2 mg/dL (ref 0.0–1.0)
pH: 7.5 (ref 5.0–8.0)

## 2015-06-13 LAB — RAPID URINE DRUG SCREEN, HOSP PERFORMED
AMPHETAMINES: NOT DETECTED
BARBITURATES: NOT DETECTED
BENZODIAZEPINES: NOT DETECTED
Cocaine: NOT DETECTED
Opiates: NOT DETECTED
Tetrahydrocannabinol: NOT DETECTED

## 2015-06-13 LAB — ETHANOL

## 2015-06-13 LAB — PROTIME-INR
INR: 1.02 (ref 0.00–1.49)
Prothrombin Time: 13.6 seconds (ref 11.6–15.2)

## 2015-06-13 LAB — CBG MONITORING, ED: GLUCOSE-CAPILLARY: 152 mg/dL — AB (ref 65–99)

## 2015-06-13 LAB — APTT: aPTT: 29 seconds (ref 24–37)

## 2015-06-13 MED ORDER — ONDANSETRON HCL 4 MG/2ML IJ SOLN
4.0000 mg | Freq: Once | INTRAMUSCULAR | Status: AC
  Administered 2015-06-13: 4 mg via INTRAVENOUS
  Filled 2015-06-13: qty 2

## 2015-06-13 MED ORDER — IBUPROFEN 200 MG PO TABS
400.0000 mg | ORAL_TABLET | Freq: Once | ORAL | Status: AC
  Administered 2015-06-13: 400 mg via ORAL
  Filled 2015-06-13: qty 2

## 2015-06-13 NOTE — ED Provider Notes (Signed)
CSN: [id number]     Arrival date & time 06/13/15  1913 History   First MD Initiated Contact with Patient 06/13/15 1933     Chief Complaint  Patient presents with  . Altered Mental Status     (Consider location/radiation/quality/duration/timing/severity/associated sxs/prior Treatment) Patient is a 81 y.o. female presenting with altered mental status.  Altered Mental Status Presenting symptoms: behavior changes and confusion   Severity:  Mild Most recent episode:  Today Episode history:  Single Duration:  2 hours Timing:  Constant Progression:  Worsening Chronicity:  New Context: not alcohol use, not a nursing home resident and not a recent illness   Associated symptoms: vomiting   Associated symptoms: no abdominal pain and no agitation     Past Medical History  Diagnosis Date  . Hypertension   . Shortness of breath   . Chronic kidney disease   . GERD (gastroesophageal reflux disease)   . Constipation   . Hypercholesteremia    Past Surgical History  Procedure Laterality Date  . Tee without cardioversion N/A 06/13/2014    Procedure: TRANSESOPHAGEAL ECHOCARDIOGRAM (TEE);  Surgeon: Laverda Page, MD;  Location: [facility] ENDOSCOPY;  Service: Cardiovascular;  Laterality: N/A;  . Esophagogastroduodenoscopy N/A 07/04/2014    Procedure: ESOPHAGOGASTRODUODENOSCOPY (EGD);  Surgeon: Laverda Page, MD;  Location: [facility] ENDOSCOPY;  Service: Cardiovascular;  Laterality: N/A;   No family history on file. Social History  Substance Use Topics  . Smoking status: Former Smoker    Quit date: 06/14/1983  . Smokeless tobacco: None  . Alcohol Use: No   OB History    No data available     Review of Systems  Unable to perform ROS: Mental status change  Respiratory: Negative for cough and shortness of breath.   Gastrointestinal: Positive for vomiting. Negative for abdominal pain.  Psychiatric/Behavioral: Positive for confusion. Negative for agitation.      Allergies  Oxycodone;  Tramadol hcl; Hydrocodone-acetaminophen; and Sulfa antibiotics  Home Medications   Prior to Admission medications   Medication Sig Start Date End Date Taking? Authorizing Provider  atorvastatin (LIPITOR) 20 MG tablet Take 20 mg by mouth at bedtime.   Yes Historical Provider, MD  cholecalciferol (VITAMIN D) 1000 UNITS tablet Take 1,000 Units by mouth daily.   Yes Historical Provider, MD  hydrocortisone (ANUSOL-HC) 2.5 % rectal cream Place 1 application rectally 2 (two) times daily.   Yes Historical Provider, MD  olmesartan-hydrochlorothiazide (BENICAR HCT) 40-25 MG per tablet Take 0.5 tablets by mouth daily with supper.   Yes Historical Provider, MD  saccharomyces boulardii (FLORASTOR) 250 MG capsule Take 1 capsule (250 mg total) by mouth 2 (two) times daily. 05/22/15  Yes Janece Canterbury, MD  simethicone (MYLICON) 80 MG chewable tablet Chew 2 tablets (160 mg total) by mouth 4 (four) times daily as needed for flatulence. 05/22/15  Yes Janece Canterbury, MD  vancomycin (VANCOCIN) 50 mg/mL oral solution Take 2.5 mLs (125 mg total) by mouth 4 (four) times daily. 05/22/15  Yes Janece Canterbury, MD  vitamin E 400 UNIT capsule Take 800 Units by mouth daily.   Yes Historical Provider, MD  Polyethyl Glycol-Propyl Glycol (SYSTANE OP) Place 1 drop into both eyes daily as needed (for dry eyes).     Historical Provider, MD   BP 140/82 mmHg  Pulse 79  Temp(Src) 102 F (38.9 C) (Oral)  Resp 18  SpO2 98% Physical Exam  Constitutional: She is oriented to person, place, and time. She appears well-developed and well-nourished. She appears distressed.  HENT:  Head: Normocephalic and atraumatic.  Eyes: Conjunctivae and EOM are normal. Right eye exhibits no discharge. Left eye exhibits no discharge.  Cardiovascular: Normal rate and regular rhythm.   Pulmonary/Chest: Effort normal and breath sounds normal. No respiratory distress.  Abdominal: Soft. She exhibits no distension. There is no tenderness. There is no  rebound.  Musculoskeletal: Normal range of motion. She exhibits no edema or tenderness.  Neurological: She is alert and oriented to person, place, and time.  Skin: Skin is warm and dry.  Nursing note and vitals reviewed.   ED Course  Procedures (including critical care time) Labs Review Labs Reviewed  COMPREHENSIVE METABOLIC PANEL - Abnormal; Notable for the following:    Sodium 126 (*)    Chloride 91 (*)    Glucose, Bld 155 (*)    BUN 23 (*)    Creatinine, Ser 1.16 (*)    Alkaline Phosphatase 136 (*)    GFR calc non Af Amer 41 (*)    GFR calc Af Amer 48 (*)    All other components within normal limits  CBC - Abnormal; Notable for the following:    WBC 11.9 (*)    All other components within normal limits  DIFFERENTIAL - Abnormal; Notable for the following:    Neutro Abs 9.8 (*)    All other components within normal limits  CBG MONITORING, ED - Abnormal; Notable for the following:    Glucose-Capillary 152 (*)    All other components within normal limits  CULTURE, BLOOD (ROUTINE X 2)  CULTURE, BLOOD (ROUTINE X 2)  ETHANOL  PROTIME-INR  APTT  URINE RAPID DRUG SCREEN, HOSP PERFORMED  URINALYSIS, ROUTINE W REFLEX MICROSCOPIC (NOT AT [facility])  I-STAT CHEM 8, ED  I-STAT TROPOININ, ED    Imaging Review Ct Head Wo Contrast  06/13/2015   CLINICAL DATA:  Inability to speak.  Nausea and vomiting.  EXAM: CT HEAD WITHOUT CONTRAST  TECHNIQUE: Contiguous axial images were obtained from the base of the skull through the vertex without intravenous contrast.  COMPARISON:  None.  FINDINGS: Global atrophy appropriate to age. Mild chronic ischemic changes in the periventricular white matter. No mass effect, midline shift, or acute hemorrhage. Mastoid air cells are clear. Cranium is intact.  IMPRESSION: No acute intracranial pathology.   Electronically Signed   By: Marybelle Killings M.D.   On: 06/13/2015 20:09   Dg Abd Acute W/chest  06/13/2015   CLINICAL DATA:  Altered mental status.  EXAM: DG  ABDOMEN ACUTE W/ 1V CHEST  COMPARISON:  05/19/2015 CT  FINDINGS: There is no evidence of dilated bowel loops or free intraperitoneal air. No radiopaque calculi or other significant radiographic abnormality is seen. There is a hiatal hernia. Heart size and mediastinal contours are within normal limits. Both lungs are clear.  IMPRESSION: Hiatal hernia. Gas pattern is negative for obstruction or perforation. No acute cardiopulmonary findings.   Electronically Signed   By: Andreas Newport M.D.   On: 06/13/2015 21:21   I have personally reviewed and evaluated these images and lab results as part of my medical decision-making.   EKG Interpretation   Date/Time:  Wednesday June 13 2015 20:18:56 EDT Ventricular Rate:  91 PR Interval:    QRS Duration: 82 QT Interval:  367 QTC Calculation: 451 R Axis:   -44 Text Interpretation:  Atrial fibrillation Ventricular premature complex  Left axis deviation Low voltage, precordial leads Consider anterior  infarct Technically poor tracing Confirmed by [facility] MD, Corene Cornea [phone]) on  06/13/2015 8:25:55 PM  MDM   Final diagnoses:  Altered mental status, unspecified altered mental status type  Hyponatremia   A 58-year-old female recently discharged from hospital with colitis presenting emergency room today for acute onset of confusion and stuttering. On arrival here vital signs are stable patient not able to talk but is alert and has left appears to be right wrist. Family and nursing staff concern for stroke however patient has no focal neurologic deficit. I discussed the case with neurology just in case and he also felt like it was unlikely be stroke. Went down an infectious workup and negative aside from hyponatremia. Could possibly still have worsenign colitis or complication of itbut not able to sit still long enough to get CT scan but is improving. Discussed case with hospitalist and will admit.        Merrily Pew, MD 06/13/15 [phone]

## 2015-06-13 NOTE — ED Notes (Signed)
Family reports that pt was at home and husband left at 58 and pt was her normal and when he returned home pt was mumbling and not herself; pt is shaking and mumbling in triage; pt unable to answer questions or follow commands; pt just states :I don't know"; no known injury or fall; pt recently in the hospital for C-Diff

## 2015-06-13 NOTE — ED Notes (Signed)
Below order not completed by EW. 

## 2015-06-13 NOTE — ED Notes (Signed)
Admitting Dr for evaluation

## 2015-06-14 DIAGNOSIS — E871 Hypo-osmolality and hyponatremia: Secondary | ICD-10-CM | POA: Diagnosis not present

## 2015-06-14 DIAGNOSIS — K219 Gastro-esophageal reflux disease without esophagitis: Secondary | ICD-10-CM | POA: Diagnosis not present

## 2015-06-14 DIAGNOSIS — Z87891 Personal history of nicotine dependence: Secondary | ICD-10-CM | POA: Diagnosis not present

## 2015-06-14 DIAGNOSIS — E785 Hyperlipidemia, unspecified: Secondary | ICD-10-CM | POA: Diagnosis present

## 2015-06-14 DIAGNOSIS — R6510 Systemic inflammatory response syndrome (SIRS) of non-infectious origin without acute organ dysfunction: Secondary | ICD-10-CM | POA: Diagnosis present

## 2015-06-14 DIAGNOSIS — Z882 Allergy status to sulfonamides status: Secondary | ICD-10-CM | POA: Diagnosis not present

## 2015-06-14 DIAGNOSIS — M549 Dorsalgia, unspecified: Secondary | ICD-10-CM | POA: Diagnosis present

## 2015-06-14 DIAGNOSIS — A047 Enterocolitis due to Clostridium difficile: Secondary | ICD-10-CM | POA: Diagnosis not present

## 2015-06-14 DIAGNOSIS — R4182 Altered mental status, unspecified: Secondary | ICD-10-CM | POA: Diagnosis not present

## 2015-06-14 DIAGNOSIS — Z885 Allergy status to narcotic agent status: Secondary | ICD-10-CM | POA: Diagnosis not present

## 2015-06-14 DIAGNOSIS — G9341 Metabolic encephalopathy: Secondary | ICD-10-CM | POA: Diagnosis present

## 2015-06-14 DIAGNOSIS — Z79899 Other long term (current) drug therapy: Secondary | ICD-10-CM | POA: Diagnosis not present

## 2015-06-14 DIAGNOSIS — R410 Disorientation, unspecified: Secondary | ICD-10-CM | POA: Diagnosis present

## 2015-06-14 DIAGNOSIS — E7800 Pure hypercholesterolemia, unspecified: Secondary | ICD-10-CM | POA: Diagnosis present

## 2015-06-14 DIAGNOSIS — I10 Essential (primary) hypertension: Secondary | ICD-10-CM | POA: Diagnosis not present

## 2015-06-14 DIAGNOSIS — I129 Hypertensive chronic kidney disease with stage 1 through stage 4 chronic kidney disease, or unspecified chronic kidney disease: Secondary | ICD-10-CM | POA: Diagnosis present

## 2015-06-14 DIAGNOSIS — K5900 Constipation, unspecified: Secondary | ICD-10-CM | POA: Diagnosis present

## 2015-06-14 DIAGNOSIS — G8929 Other chronic pain: Secondary | ICD-10-CM | POA: Diagnosis present

## 2015-06-14 DIAGNOSIS — R509 Fever, unspecified: Secondary | ICD-10-CM

## 2015-06-14 DIAGNOSIS — R404 Transient alteration of awareness: Secondary | ICD-10-CM | POA: Diagnosis not present

## 2015-06-14 DIAGNOSIS — N182 Chronic kidney disease, stage 2 (mild): Secondary | ICD-10-CM | POA: Diagnosis present

## 2015-06-14 DIAGNOSIS — M1990 Unspecified osteoarthritis, unspecified site: Secondary | ICD-10-CM | POA: Diagnosis present

## 2015-06-14 LAB — COMPREHENSIVE METABOLIC PANEL
ALT: 26 U/L (ref 14–54)
ANION GAP: 5 (ref 5–15)
AST: 29 U/L (ref 15–41)
Albumin: 3.3 g/dL — ABNORMAL LOW (ref 3.5–5.0)
Alkaline Phosphatase: 106 U/L (ref 38–126)
BUN: 22 mg/dL — ABNORMAL HIGH (ref 6–20)
CO2: 22 mmol/L (ref 22–32)
Calcium: 8.8 mg/dL — ABNORMAL LOW (ref 8.9–10.3)
Chloride: 99 mmol/L — ABNORMAL LOW (ref 101–111)
Creatinine, Ser: 1.16 mg/dL — ABNORMAL HIGH (ref 0.44–1.00)
GFR calc non Af Amer: 41 mL/min — ABNORMAL LOW (ref >60)
GFR, EST AFRICAN AMERICAN: 48 mL/min — AB (ref >60)
Glucose, Bld: 105 mg/dL — ABNORMAL HIGH (ref 65–99)
Potassium: 4.1 mmol/L (ref 3.5–5.1)
SODIUM: 126 mmol/L — AB (ref 135–145)
Total Bilirubin: 0.5 mg/dL (ref 0.3–1.2)
Total Protein: 5.7 g/dL — ABNORMAL LOW (ref 6.5–8.1)

## 2015-06-14 LAB — CBC WITH DIFFERENTIAL/PLATELET
Basophils Absolute: 0 10*3/uL (ref 0.0–0.1)
Basophils Relative: 0 %
Eosinophils Absolute: 0 10*3/uL (ref 0.0–0.7)
Eosinophils Relative: 0 %
HEMATOCRIT: 29.9 % — AB (ref 36.0–46.0)
HEMOGLOBIN: 10.6 g/dL — AB (ref 12.0–15.0)
LYMPHS ABS: 1.4 10*3/uL (ref 0.7–4.0)
Lymphocytes Relative: 12 %
MCH: 30.6 pg (ref 26.0–34.0)
MCHC: 35.5 g/dL (ref 30.0–36.0)
MCV: 86.4 fL (ref 78.0–100.0)
MONOS PCT: 9 %
Monocytes Absolute: 1 10*3/uL (ref 0.1–1.0)
NEUTROS ABS: 9.4 10*3/uL — AB (ref 1.7–7.7)
NEUTROS PCT: 79 %
Platelets: 257 10*3/uL (ref 150–400)
RBC: 3.46 MIL/uL — ABNORMAL LOW (ref 3.87–5.11)
RDW: 12.7 % (ref 11.5–15.5)
WBC: 11.9 10*3/uL — ABNORMAL HIGH (ref 4.0–10.5)

## 2015-06-14 LAB — POCT I-STAT, CHEM 8
BUN: 22 mg/dL — AB (ref 6–20)
CHLORIDE: 91 mmol/L — AB (ref 101–111)
Calcium, Ion: 1.17 mmol/L (ref 1.13–1.30)
Creatinine, Ser: 1.2 mg/dL — ABNORMAL HIGH (ref 0.44–1.00)
Glucose, Bld: 149 mg/dL — ABNORMAL HIGH (ref 65–99)
HEMATOCRIT: 39 % (ref 36.0–46.0)
Hemoglobin: 13.3 g/dL (ref 12.0–15.0)
Potassium: 4.2 mmol/L (ref 3.5–5.1)
SODIUM: 126 mmol/L — AB (ref 135–145)
TCO2: 22 mmol/L (ref 0–100)

## 2015-06-14 LAB — MAGNESIUM: Magnesium: 1.5 mg/dL — ABNORMAL LOW (ref 1.7–2.4)

## 2015-06-14 LAB — LACTIC ACID, PLASMA
LACTIC ACID, VENOUS: 1.3 mmol/L (ref 0.5–2.0)
LACTIC ACID, VENOUS: 2.1 mmol/L — AB (ref 0.5–2.0)

## 2015-06-14 LAB — POCT I-STAT TROPONIN I: Troponin i, poc: 0 ng/mL (ref 0.00–0.08)

## 2015-06-14 LAB — PHOSPHORUS: PHOSPHORUS: 2.6 mg/dL (ref 2.5–4.6)

## 2015-06-14 MED ORDER — LORAZEPAM 2 MG/ML IJ SOLN: 0.5000 mg | INTRAMUSCULAR | Status: DC | PRN

## 2015-06-14 MED ORDER — VANCOMYCIN 50 MG/ML ORAL SOLUTION
125.0000 mg | Freq: Four times a day (QID) | ORAL | Status: AC
  Administered 2015-06-14 (×4): 125 mg via ORAL
  Filled 2015-06-14 (×4): qty 2.5

## 2015-06-14 MED ORDER — ONDANSETRON HCL 4 MG/2ML IJ SOLN: 4.0000 mg | Freq: Four times a day (QID) | INTRAMUSCULAR | Status: DC | PRN

## 2015-06-14 MED ORDER — VANCOMYCIN HCL IN DEXTROSE 1-5 GM/200ML-% IV SOLN
1000.0000 mg | INTRAVENOUS | Status: DC
  Administered 2015-06-14 – 2015-06-15 (×2): 1000 mg via INTRAVENOUS
  Filled 2015-06-14 (×2): qty 200

## 2015-06-14 MED ORDER — LORAZEPAM 2 MG/ML IJ SOLN
0.5000 mg | Freq: Once | INTRAMUSCULAR | Status: AC
  Administered 2015-06-14: 0.5 mg via INTRAVENOUS
  Filled 2015-06-14: qty 1

## 2015-06-14 MED ORDER — SIMETHICONE 80 MG PO CHEW
160.0000 mg | CHEWABLE_TABLET | Freq: Four times a day (QID) | ORAL | Status: DC | PRN
  Filled 2015-06-14: qty 2

## 2015-06-14 MED ORDER — SACCHAROMYCES BOULARDII 250 MG PO CAPS
250.0000 mg | ORAL_CAPSULE | Freq: Two times a day (BID) | ORAL | Status: DC
  Administered 2015-06-14 – 2015-06-15 (×5): 250 mg via ORAL
  Filled 2015-06-14 (×7): qty 1

## 2015-06-14 MED ORDER — PANTOPRAZOLE SODIUM 40 MG PO TBEC
40.0000 mg | DELAYED_RELEASE_TABLET | Freq: Every day | ORAL | Status: DC
  Administered 2015-06-14 – 2015-06-15 (×2): 40 mg via ORAL
  Filled 2015-06-14 (×3): qty 1

## 2015-06-14 MED ORDER — HYDROCORTISONE 2.5 % RE CREA
1.0000 "application " | TOPICAL_CREAM | Freq: Two times a day (BID) | RECTAL | Status: DC
  Administered 2015-06-14 – 2015-06-15 (×5): 1 via RECTAL
  Filled 2015-06-14: qty 28.35

## 2015-06-14 MED ORDER — ACETAMINOPHEN 325 MG PO TABS: 650.0000 mg | ORAL_TABLET | Freq: Four times a day (QID) | ORAL | Status: DC | PRN

## 2015-06-14 MED ORDER — VANCOMYCIN HCL IN DEXTROSE 1-5 GM/200ML-% IV SOLN: 1000.0000 mg | Freq: Two times a day (BID) | INTRAVENOUS | Status: DC

## 2015-06-14 MED ORDER — SODIUM CHLORIDE 0.9 % IV SOLN
INTRAVENOUS | Status: DC
  Administered 2015-06-14 (×3): via INTRAVENOUS

## 2015-06-14 MED ORDER — VITAMIN E 180 MG (400 UNIT) PO CAPS
800.0000 [IU] | ORAL_CAPSULE | Freq: Every day | ORAL | Status: DC
  Administered 2015-06-14 – 2015-06-15 (×2): 800 [IU] via ORAL
  Filled 2015-06-14 (×3): qty 2

## 2015-06-14 MED ORDER — HALOPERIDOL LACTATE 5 MG/ML IJ SOLN
2.0000 mg | Freq: Once | INTRAMUSCULAR | Status: AC
  Administered 2015-06-14: 2 mg via INTRAVENOUS
  Filled 2015-06-14: qty 1

## 2015-06-14 MED ORDER — SODIUM CHLORIDE 0.9 % IV BOLUS (SEPSIS)
500.0000 mL | Freq: Once | INTRAVENOUS | Status: AC
  Administered 2015-06-14: 500 mL via INTRAVENOUS

## 2015-06-14 MED ORDER — ONDANSETRON HCL 4 MG PO TABS: 4.0000 mg | ORAL_TABLET | Freq: Four times a day (QID) | ORAL | Status: DC | PRN

## 2015-06-14 MED ORDER — PIPERACILLIN-TAZOBACTAM 3.375 G IVPB
3.3750 g | Freq: Three times a day (TID) | INTRAVENOUS | Status: DC
  Administered 2015-06-14 – 2015-06-15 (×5): 3.375 g via INTRAVENOUS
  Filled 2015-06-14 (×6): qty 50

## 2015-06-14 MED ORDER — POLYVINYL ALCOHOL 1.4 % OP SOLN
Freq: Every day | OPHTHALMIC | Status: DC | PRN
  Filled 2015-06-14: qty 15

## 2015-06-14 MED ORDER — ACETAMINOPHEN 650 MG RE SUPP: 650.0000 mg | Freq: Four times a day (QID) | RECTAL | Status: DC | PRN

## 2015-06-14 MED ORDER — ENOXAPARIN SODIUM 40 MG/0.4ML ~~LOC~~ SOLN
40.0000 mg | Freq: Every day | SUBCUTANEOUS | Status: DC
  Administered 2015-06-14 – 2015-06-15 (×2): 40 mg via SUBCUTANEOUS
  Filled 2015-06-14 (×2): qty 0.4

## 2015-06-14 MED ORDER — SODIUM CHLORIDE 0.9 % IJ SOLN
3.0000 mL | Freq: Two times a day (BID) | INTRAMUSCULAR | Status: DC
  Administered 2015-06-15 (×2): 3 mL via INTRAVENOUS

## 2015-06-14 NOTE — H&P (Signed)
[facility] History and Physical  SHANAN FITZPATRICK QPY:[id number] DOB: October 07, 1925 DOA: 06/13/2015  Referring physician: Dorise Bullion, M.D. PCP: Antony Blackbird, MD   Chief Complaint: Confusion.  HPI: Susan Banks is a 81 y.o. female with a past medical history of hypertension, hyperlipidemia, GERD, chronic kidney disease, occasional constipation who was brought to the emergency department after her daughter noticed that she was not at her baseline mental status and was confused. She is states that the patient's spouse saw her around 16:30 watching TV, then her daughter call her on the phone around 17:30 and noticed that something was not right in the way her mother was talking. She then went to the patient's home and confirmed the change in mental status. Her daughter initially thought that she may have been having a stroke, but CVA workup has been negative.   The patient was subsequently noticed to be febrile in the emergency department. She was recently treated for C. difficile colitis and is taking oral vancomycin. However, her daughter states that she has been constipated recently and has not had diarrhea in a while. Workup so far has only revealed hyponatremia. Per patient's daughter and son-in-law, her mental status has improved, but is not close to baseline yet. She is currently mildly anxious, but is in no acute distress.   Review of Systems:  Unable to fully review due to the patient's confusion.  Past Medical History  Diagnosis Date  . Hypertension   . Shortness of breath   . Chronic kidney disease   . GERD (gastroesophageal reflux disease)   . Constipation   . Hypercholesteremia    Past Surgical History  Procedure Laterality Date  . Tee without cardioversion N/A 06/13/2014    Procedure: TRANSESOPHAGEAL ECHOCARDIOGRAM (TEE);  Surgeon: Laverda Page, MD;  Location: [facility] ENDOSCOPY;  Service: Cardiovascular;  Laterality: N/A;  . Esophagogastroduodenoscopy N/A 07/04/2014      Procedure: ESOPHAGOGASTRODUODENOSCOPY (EGD);  Surgeon: Laverda Page, MD;  Location: [place];  Service: Cardiovascular;  Laterality: N/A;   Social History:  reports that she quit smoking about 32 years ago. She does not have any smokeless tobacco history on file. She reports that she does not drink alcohol or use illicit drugs.  Allergies  Allergen Reactions  . Oxycodone Hives  . Tramadol Hcl Hives  . Hydrocodone-Acetaminophen Rash  . Sulfa Antibiotics Nausea And Vomiting    No family history on file.  Prior to Admission medications   Medication Sig Start Date End Date Taking? Authorizing Provider  atorvastatin (LIPITOR) 20 MG tablet Take 20 mg by mouth at bedtime.   Yes Historical Provider, MD  cholecalciferol (VITAMIN D) 1000 UNITS tablet Take 1,000 Units by mouth daily.   Yes Historical Provider, MD  hydrocortisone (ANUSOL-HC) 2.5 % rectal cream Place 1 application rectally 2 (two) times daily.   Yes Historical Provider, MD  olmesartan-hydrochlorothiazide (BENICAR HCT) 40-25 MG per tablet Take 0.5 tablets by mouth daily with supper.   Yes Historical Provider, MD  saccharomyces boulardii (FLORASTOR) 250 MG capsule Take 1 capsule (250 mg total) by mouth 2 (two) times daily. 05/22/15  Yes Janece Canterbury, MD  simethicone (MYLICON) 80 MG chewable tablet Chew 2 tablets (160 mg total) by mouth 4 (four) times daily as needed for flatulence. 05/22/15  Yes Janece Canterbury, MD  vancomycin (VANCOCIN) 50 mg/mL oral solution Take 2.5 mLs (125 mg total) by mouth 4 (four) times daily. 05/22/15  Yes Janece Canterbury, MD  vitamin E 400 UNIT capsule Take 800  Units by mouth daily.   Yes Historical Provider, MD  Polyethyl Glycol-Propyl Glycol (SYSTANE OP) Place 1 drop into both eyes daily as needed (for dry eyes).     Historical Provider, MD   Physical Exam: Filed Vitals:   06/13/15 2124 06/13/15 2130 06/13/15 2230 06/13/15 2307  BP: 133/78 116/84 110/84 140/82  Pulse:  96 97 79  Temp:    102  F (38.9 C)  TempSrc:    Oral  Resp:    18  SpO2:  97% 97% 98%    Wt Readings from Last 3 Encounters:  05/19/15 63.186 kg (139 lb 4.8 oz)  06/13/14 63.05 kg (139 lb)    General:  Appears anxious and is confused. Eyes: PERRL, normal lids, irises & conjunctiva ENT: grossly normal hearing, lips & tongue are dry. Neck: no LAD, masses or thyromegaly Cardiovascular: RRR, no m/r/g. No LE edema. Telemetry: SR, no arrhythmias  Respiratory: Decreased breath sounds on the bases with mild rales. Abdomen: soft, ntnd Skin: no rash or induration seen on limited exam Musculoskeletal: grossly normal tone BUE/BLE Psychiatric: Awake alert oriented 1. Restless and confused. Neurologic: Moves all extremities and seems to be grossly nonfocal, but unable to fully evaluate due to current clinical status           Labs on Admission:  Basic Metabolic Panel:  Recent Labs Lab 06/13/15 1945  NA 126*  K 4.1  CL 91*  CO2 23  GLUCOSE 155*  BUN 23*  CREATININE 1.16*  CALCIUM 10.0   Liver Function Tests:  Recent Labs Lab 06/13/15 1945  AST 35  ALT 35  ALKPHOS 136*  BILITOT 0.3  PROT 7.3  ALBUMIN 4.3   CBC:  Recent Labs Lab 06/13/15 1945  WBC 11.9*  NEUTROABS 9.8*  HGB 12.3  HCT 36.2  MCV 86.6  PLT 315    CBG:  Recent Labs Lab 06/13/15 1941  GLUCAP 152*    Radiological Exams on Admission: Ct Head Wo Contrast  06/13/2015   CLINICAL DATA:  Inability to speak.  Nausea and vomiting.  EXAM: CT HEAD WITHOUT CONTRAST  TECHNIQUE: Contiguous axial images were obtained from the base of the skull through the vertex without intravenous contrast.  COMPARISON:  None.  FINDINGS: Global atrophy appropriate to age. Mild chronic ischemic changes in the periventricular white matter. No mass effect, midline shift, or acute hemorrhage. Mastoid air cells are clear. Cranium is intact.  IMPRESSION: No acute intracranial pathology.   Electronically Signed   By: Marybelle Killings M.D.   On: 06/13/2015  20:09   Dg Abd Acute W/chest  06/13/2015   CLINICAL DATA:  Altered mental status.  EXAM: DG ABDOMEN ACUTE W/ 1V CHEST  COMPARISON:  05/19/2015 CT  FINDINGS: There is no evidence of dilated bowel loops or free intraperitoneal air. No radiopaque calculi or other significant radiographic abnormality is seen. There is a hiatal hernia. Heart size and mediastinal contours are within normal limits. Both lungs are clear.  IMPRESSION: Hiatal hernia. Gas pattern is negative for obstruction or perforation. No acute cardiopulmonary findings.   Electronically Signed   By: Andreas Newport M.D.   On: 06/13/2015 21:21    EKG: Independently reviewed. Vent. rate 91 BPM PR interval * ms QRS duration 82 ms QT/QTc 367/451 ms P-R-T axes -1 -44 40 Atrial fibrillation Ventricular premature complex Left axis deviation Low voltage, precordial leads Consider anterior infarct Technically poor tracing  Assessment/Plan Principal Problem:   Altered mental status Patient's mental status has improved somehow  after receiving normal saline in the emergency department. Continue treatment of hyponatremia and start IV antibiotics for color of fever.  Active Problems:    Hyponatremia Continue normal saline infusion and monitor sodium level.      Fever unknown source at this time, although her chest x-ray was clear, the patient has rales on physical exam. I will start Zosyn and vancomycin. Follow-up blood cultures.    HTN (hypertension) Continue angiotensin receptor blocker, hold hydrochlorothiazide and monitor blood pressure.    C. difficile colitis As per patient's daughter, now the patient has been constipated. She had a previous history of constipation before having C. difficile colitis. Continue oral vancomycin.      GERD (gastroesophageal reflux disease) Start pantoprazole 40 mg by mouth daily.    Hyperlipidemia Hold Lipitor while the patient is in the hospital. Monitor LFTs periodically.   Code  Status: Full code. DVT Prophylaxis: Lovenox SQ. Family Communication:  Regan,Kimberly Daughter [phone]  Disposition Plan: Admit for sodium replacement and IV antibiotic therapy.  Time spent: Over 70 minutes were spent in the process of this admission.  Reubin Milan [facility] Pager [phone].

## 2015-06-14 NOTE — Care Management Note (Signed)
Case Management Note  Patient Details  Name: Susan Banks MRN: [id number] Date of Birth: March 14, 1926  Subjective/Objective:          AMS Fever poss systemic infection as been on po vancomycin for c-diff for several weeks         Action/Plan:Date:  Oct. 06, 2016 U.R. performed for needs and level of care. Will continue to follow for Case Management needs.  Velva Harman, RN, BSN, [place]   [phone]   Expected Discharge Date:   (unkown)               Expected Discharge Plan:  Home/Self Care  In-House Referral:  Clinical Social Work  Discharge planning Services  CM Consult  Post Acute Care Choice:    Choice offered to:     DME Arranged:    DME Agency:     HH Arranged:    [place] Agency:     Status of Service:  In process, will continue to follow  Medicare Important Message Given:    Date Medicare IM Given:    Medicare IM give by:    Date Additional Medicare IM Given:    Additional Medicare Important Message give by:     If discussed at [place] of Stay Meetings, dates discussed:    Additional Comments:  Leeroy Cha, RN 06/14/2015, 11:40 AM

## 2015-06-14 NOTE — Progress Notes (Signed)
CRITICAL VALUE ALERT  Critical value received:  Lactic Acid 2.1  Date of notification:  06/14/15  Time of notification:  2133  Critical value read back:yes  Nurse who received alert:  Jearld Shines  MD notified (1st page):  Baltazar Najjar Time of first page:  2136  MD notified (2nd page):  Time of second page:  Responding MD:  Baltazar Najjar  Time MD responded: 2140

## 2015-06-14 NOTE — Progress Notes (Signed)
During day, patient's BP dropped to 88/41. Patient had no complaints with improved altered mental status. Notified MD. New orders placed and continue to monitor. Patient's BP has improved but still soft.

## 2015-06-14 NOTE — Progress Notes (Signed)
Initial Nutrition Assessment   DOCUMENTATION CODES:   Not applicable  INTERVENTION:  - Will order Magic Cup once/day, thissupplement provides 290 kcal and 9 grams of protein - RD will continue to monitor for needs  NUTRITION DIAGNOSIS:   Inadequate oral intake related to acute illness, poor appetite as evidenced by per patient/family report.  GOAL:   Patient will meet greater than or equal to 90% of their needs  MONITOR:   PO intake, Supplement acceptance, Weight trends, Labs, I & O's  REASON FOR ASSESSMENT:   Malnutrition Screening Tool  ASSESSMENT:   81 y.o. female with a past medical history of hypertension, hyperlipidemia, GERD, chronic kidney disease, occasional constipation who was brought to the emergency department after her daughter noticed that she was not at her baseline mental status and was confused. She is states that the patient's spouse saw her around 16:30 watching TV, then her daughter call her on the phone around 17:30 and noticed that something was not right in the way her mother was talking. She then went to the patient's home and confirmed the change in mental status. Her daughter initially thought that she may have been having a stroke, but CVA workup has been negative.   Pt seen for MST. BMI indicates over weight status. Pt sleeping at time of visit and physical assessment not done at this time due to that and RN in room preparing to work with pt.   All information provided by pt's daughter who was at bedside. She states she works full time so she is unsure of specifics, but she has noticed a decrease in her mom's appetite for the past 1 week. She states that pt had reported snacking throughout the day but daughter was unable to confirm this. She states that pt was in the hospital about a month ago and lost 6-7 lbs at that time but no additional weight loss noted since that time. Per chart review, pt has gained 2 lbs in the past 1 month.   Daughter states  that pt has been resting this AM and did not eat breakfast. At home, pt was not drinking supplements such as Ensure or Boost. Daughter denies pt having chewing or swallowing issues at baseline.  Will order Magic Cup once/day and monitor for tolerance/acceptance. Not meeting needs. Medications reviewed. Labs reviewed; Na: 126 mmol/L, Cl: 99 mmol/L, BUN/creatinine elevated, Ca: 8.8 mg/dL, Mg: 1.5 mg/dL, GFR: 41.   Diet Order:  Diet Heart Room service appropriate?: Yes; Fluid consistency:: Thin  Skin:  Reviewed, no issues  Last BM:  PTA  Height:   Ht Readings from Last 1 Encounters:  06/14/15 5\' 3"  (1.6 m)    Weight:   Wt Readings from Last 1 Encounters:  06/14/15 141 lb 1.5 oz (64 kg)    Ideal Body Weight:  52.27 kg (kg)  BMI:  Body mass index is 25 kg/(m^2).  Estimated Nutritional Needs:   Kcal:  1280-1480  Protein:  50-60 grams  Fluid:  2.2 L/day  EDUCATION NEEDS:   No education needs identified at this time     Jarome Matin, RD, LDN Inpatient Clinical Dietitian Pager # [phone] After hours/weekend pager # [phone]

## 2015-06-14 NOTE — Progress Notes (Signed)
Pt seen and examined, admitted earlier this am by Dr.Ortiz 1. Fever/SIRS -continue IV Vanc and Zosyn -FU Blood CX, UA, CXR unremarkable, Abd exam benign -IVF, supportive care  2. Metabolic encephalopathy -due to 1, improved -no meningeal signs  3. H/o C diff colitis -just completed 4 weeks of PO Vancomycin today, no further diarrhea, no abd pain, N/V -monitor  4. CKD2 -stable, monitor  5. Hyponatremia -chronic from HCTZ, now held -hydrate, IVF, monitor  6. GERD -PPI  7. Chronic back pain -may need imaging of low back if worsens and cause of fever remains unclear  Susan Polite, MD [phone]

## 2015-06-14 NOTE — Progress Notes (Signed)
ANTIBIOTIC CONSULT NOTE - INITIAL  Pharmacy Consult for Zosyn/Vancomycin Indication: Fever, unknown source  Allergies  Allergen Reactions  . Oxycodone Hives  . Tramadol Hcl Hives  . Hydrocodone-Acetaminophen Rash  . Sulfa Antibiotics Nausea And Vomiting    Patient Measurements: Height: 5\' 3"  (160 cm) Weight: 141 lb 1.5 oz (64 kg) IBW/kg (Calculated) : 52.4   Vital Signs: Temp: 98.1 F (36.7 C) (10/06 0050) Temp Source: Oral (10/06 0050) BP: 103/56 mmHg (10/06 0050) Pulse Rate: 77 (10/06 0050) Intake/Output from previous day:   Intake/Output from this shift:    Labs:  Recent Labs  06/13/15 1945  WBC 11.9*  HGB 12.3  PLT 315  CREATININE 1.16*   Estimated Creatinine Clearance: 30.7 mL/min (by C-G formula based on Cr of 1.16). No results for input(s): VANCOTROUGH, VANCOPEAK, VANCORANDOM, GENTTROUGH, GENTPEAK, GENTRANDOM, TOBRATROUGH, TOBRAPEAK, TOBRARND, AMIKACINPEAK, AMIKACINTROU, AMIKACIN in the last 72 hours.   Microbiology: Recent Results (from the past 720 hour(s))  C difficile quick scan w PCR reflex     Status: Abnormal   Collection Time: 05/20/15  9:52 AM  Result Value Ref Range Status   C Diff antigen POSITIVE (A) NEGATIVE Final   C Diff toxin NEGATIVE NEGATIVE Final   C Diff interpretation   Final    C. difficile present, but toxin not detected. This indicates colonization. In most cases, this does not require treatment. If patient has signs and symptoms consistent with colitis, consider treatment.    Medical History: Past Medical History  Diagnosis Date  . Hypertension   . Shortness of breath   . Chronic kidney disease   . GERD (gastroesophageal reflux disease)   . Constipation   . Hypercholesteremia     Medications:  Prescriptions prior to admission  Medication Sig Dispense Refill Last Dose  . atorvastatin (LIPITOR) 20 MG tablet Take 20 mg by mouth at bedtime.   06/12/2015 at Unknown time  . cholecalciferol (VITAMIN D) 1000 UNITS tablet Take  1,000 Units by mouth daily.   06/13/2015 at Unknown time  . hydrocortisone (ANUSOL-HC) 2.5 % rectal cream Place 1 application rectally 2 (two) times daily.   06/13/2015 at Unknown time  . olmesartan-hydrochlorothiazide (BENICAR HCT) 40-25 MG per tablet Take 0.5 tablets by mouth daily with supper.   06/12/2015 at Unknown time  . saccharomyces boulardii (FLORASTOR) 250 MG capsule Take 1 capsule (250 mg total) by mouth 2 (two) times daily. 60 capsule 3 06/13/2015 at Unknown time  . simethicone (MYLICON) 80 MG chewable tablet Chew 2 tablets (160 mg total) by mouth 4 (four) times daily as needed for flatulence. 30 tablet 0 unknown at Unknown time  . vancomycin (VANCOCIN) 50 mg/mL oral solution Take 2.5 mLs (125 mg total) by mouth 4 (four) times daily. 200 mL 0 06/13/2015 at Unknown time  . vitamin E 400 UNIT capsule Take 800 Units by mouth daily.   06/13/2015 at Unknown time  . Polyethyl Glycol-Propyl Glycol (SYSTANE OP) Place 1 drop into both eyes daily as needed (for dry eyes).    unknown at unknown time   Scheduled:  . enoxaparin (LOVENOX) injection  40 mg Subcutaneous Daily  . hydrocortisone  1 application Rectal BID  . piperacillin-tazobactam (ZOSYN)  IV  3.375 g Intravenous 3 times per day  . saccharomyces boulardii  250 mg Oral BID  . sodium chloride  3 mL Intravenous Q12H  . vancomycin  125 mg Oral QID  . vancomycin  1,000 mg Intravenous Q24H  . vitamin E  800 Units Oral  Daily   Infusions:  . sodium chloride 75 mL/hr at 06/14/15 0049   Assessment: 40 yoF admitted with altered mental status and fever.  S/p recent hospitalization with C-diff.  Zosyn/Vancomycin per Rx for fever.   Goal of Therapy:  Vancomycin trough level 15-20 mcg/ml  Plan:   Zosyn 3.375 Gm IV q8h EI  Vancomycin 1Gm IV q24h  F/u scr/cultures/levels  Lawana Pai R 06/14/2015,1:24 AM

## 2015-06-15 DIAGNOSIS — R404 Transient alteration of awareness: Secondary | ICD-10-CM

## 2015-06-15 DIAGNOSIS — E871 Hypo-osmolality and hyponatremia: Secondary | ICD-10-CM

## 2015-06-15 DIAGNOSIS — I10 Essential (primary) hypertension: Secondary | ICD-10-CM

## 2015-06-15 LAB — COMPREHENSIVE METABOLIC PANEL
ALT: 24 U/L (ref 14–54)
AST: 26 U/L (ref 15–41)
Albumin: 3.1 g/dL — ABNORMAL LOW (ref 3.5–5.0)
Alkaline Phosphatase: 86 U/L (ref 38–126)
Anion gap: 6 (ref 5–15)
BILIRUBIN TOTAL: 0.9 mg/dL (ref 0.3–1.2)
BUN: 14 mg/dL (ref 6–20)
CO2: 20 mmol/L — ABNORMAL LOW (ref 22–32)
Calcium: 8.7 mg/dL — ABNORMAL LOW (ref 8.9–10.3)
Chloride: 106 mmol/L (ref 101–111)
Creatinine, Ser: 1.19 mg/dL — ABNORMAL HIGH (ref 0.44–1.00)
GFR calc Af Amer: 46 mL/min — ABNORMAL LOW (ref >60)
GFR, EST NON AFRICAN AMERICAN: 40 mL/min — AB (ref >60)
Glucose, Bld: 95 mg/dL (ref 65–99)
POTASSIUM: 3.9 mmol/L (ref 3.5–5.1)
Sodium: 132 mmol/L — ABNORMAL LOW (ref 135–145)
Total Protein: 5.2 g/dL — ABNORMAL LOW (ref 6.5–8.1)

## 2015-06-15 LAB — CBC WITH DIFFERENTIAL/PLATELET
BASOS ABS: 0 10*3/uL (ref 0.0–0.1)
BASOS PCT: 0 %
EOS ABS: 0.1 10*3/uL (ref 0.0–0.7)
EOS PCT: 1 %
HCT: 27.8 % — ABNORMAL LOW (ref 36.0–46.0)
Hemoglobin: 9.4 g/dL — ABNORMAL LOW (ref 12.0–15.0)
LYMPHS PCT: 24 %
Lymphs Abs: 2.1 10*3/uL (ref 0.7–4.0)
MCH: 29.6 pg (ref 26.0–34.0)
MCHC: 33.8 g/dL (ref 30.0–36.0)
MCV: 87.4 fL (ref 78.0–100.0)
Monocytes Absolute: 0.9 10*3/uL (ref 0.1–1.0)
Monocytes Relative: 10 %
Neutro Abs: 5.7 10*3/uL (ref 1.7–7.7)
Neutrophils Relative %: 65 %
PLATELETS: 223 10*3/uL (ref 150–400)
RBC: 3.18 MIL/uL — AB (ref 3.87–5.11)
RDW: 12.9 % (ref 11.5–15.5)
WBC: 8.8 10*3/uL (ref 4.0–10.5)

## 2015-06-15 LAB — LACTIC ACID, PLASMA: LACTIC ACID, VENOUS: 1.2 mmol/L (ref 0.5–2.0)

## 2015-06-15 MED ORDER — ENOXAPARIN SODIUM 30 MG/0.3ML ~~LOC~~ SOLN
30.0000 mg | Freq: Every day | SUBCUTANEOUS | Status: DC
  Filled 2015-06-15: qty 0.3

## 2015-06-15 NOTE — Progress Notes (Signed)
[facility] PROGRESS NOTE  Susan Banks XLK:[id number] DOB: April 01, 1926 DOA: 06/13/2015 PCP: Antony Blackbird, MD  Assessment/Plan: 1. Fever/SIRS -resolved, no clear source of infection, suspect viral illness -afebrile and WBC normal, will stop IV Vanc and Zosyn -Blood CX-NGTD, UA/CXR unremarkable, Abd exam benign -cut down IVF, monitor off abx  2. Metabolic encephalopathy -due to 1, resolved, back to baseline -no meningeal signs  3. H/o C diff colitis -just completed 4 weeks of PO Vancomycin today, no further diarrhea, no abd pain, N/V -monitor  4. CKD2 -stable, monitor  5. Hyponatremia -chronic from HCTZ, now held -improved, cut down IVF  6. GERD -PPI  7. Chronic back pain/DJD -denies at pain at this time  Ambulate, Pt eval  Code Status: Full Code Family Communication:daughter at bedside Disposition Plan: home tomorrow if stable  HPI/Subjective: Feels well, no complaints, no back pain  Objective: Filed Vitals:   06/15/15 0845  BP:   Pulse: 82  Temp: 98 F (36.7 C)  Resp: 20    Intake/Output Summary (Last 24 hours) at 06/15/15 1113 Last data filed at 06/15/15 0838  Gross per 24 hour  Intake   1595 ml  Output    775 ml  Net    820 ml   Filed Weights   06/14/15 0050  Weight: 64 kg (141 lb 1.5 oz)    Exam:   General:  AAOx3  Cardiovascular: S1S2/RRR  Respiratory: CTAB  Abdomen: soft, NT, BS present  Musculoskeletal: no edema c/c  Data Reviewed: Basic Metabolic Panel:  Recent Labs Lab 06/13/15 1945 06/13/15 1953 06/14/15 0247 06/15/15 0550  NA 126* 126* 126* 132*  K 4.1 4.2 4.1 3.9  CL 91* 91* 99* 106  CO2 23  --  22 20*  GLUCOSE 155* 149* 105* 95  BUN 23* 22* 22* 14  CREATININE 1.16* 1.20* 1.16* 1.19*  CALCIUM 10.0  --  8.8* 8.7*  MG  --   --  1.5*  --   PHOS  --   --  2.6  --    Liver Function Tests:  Recent Labs Lab 06/13/15 1945 06/14/15 0247 06/15/15 0550  AST 35 29 26  ALT 35 26 24  ALKPHOS 136* 106 86   BILITOT 0.3 0.5 0.9  PROT 7.3 5.7* 5.2*  ALBUMIN 4.3 3.3* 3.1*   No results for input(s): LIPASE, AMYLASE in the last 168 hours. No results for input(s): AMMONIA in the last 168 hours. CBC:  Recent Labs Lab 06/13/15 1945 06/13/15 1953 06/14/15 0247 06/15/15 0550  WBC 11.9*  --  11.9* 8.8  NEUTROABS 9.8*  --  9.4* 5.7  HGB 12.3 13.3 10.6* 9.4*  HCT 36.2 39.0 29.9* 27.8*  MCV 86.6  --  86.4 87.4  PLT 315  --  257 223   Cardiac Enzymes: No results for input(s): CKTOTAL, CKMB, CKMBINDEX, TROPONINI in the last 168 hours. BNP (last 3 results) No results for input(s): BNP in the last 8760 hours.  ProBNP (last 3 results) No results for input(s): PROBNP in the last 8760 hours.  CBG:  Recent Labs Lab 06/13/15 1941  GLUCAP 152*    Recent Results (from the past 240 hour(s))  Blood culture (routine x 2)     Status: None (Preliminary result)   Collection Time: 06/13/15  7:45 PM  Result Value Ref Range Status   Specimen Description BLOOD RIGHT ANTECUBITAL  Final   Special Requests BOTTLES DRAWN AEROBIC AND ANAEROBIC 5CC  Final   Culture   Final  NO GROWTH 2 DAYS Performed at [facility]    Report Status PENDING  Incomplete  Blood culture (routine x 2)     Status: None (Preliminary result)   Collection Time: 06/14/15  2:47 AM  Result Value Ref Range Status   Specimen Description BLOOD BLOOD RIGHT HAND  Final   Special Requests IN PEDIATRIC BOTTLE 4CC  Final   Culture   Final    NO GROWTH 1 DAY Performed at [facility]    Report Status PENDING  Incomplete     Studies: Ct Head Wo Contrast  06/13/2015   CLINICAL DATA:  Inability to speak.  Nausea and vomiting.  EXAM: CT HEAD WITHOUT CONTRAST  TECHNIQUE: Contiguous axial images were obtained from the base of the skull through the vertex without intravenous contrast.  COMPARISON:  None.  FINDINGS: Global atrophy appropriate to age. Mild chronic ischemic changes in the periventricular white matter. No mass  effect, midline shift, or acute hemorrhage. Mastoid air cells are clear. Cranium is intact.  IMPRESSION: No acute intracranial pathology.   Electronically Signed   By: Marybelle Killings M.D.   On: 06/13/2015 20:09   Dg Abd Acute W/chest  06/13/2015   CLINICAL DATA:  Altered mental status.  EXAM: DG ABDOMEN ACUTE W/ 1V CHEST  COMPARISON:  05/19/2015 CT  FINDINGS: There is no evidence of dilated bowel loops or free intraperitoneal air. No radiopaque calculi or other significant radiographic abnormality is seen. There is a hiatal hernia. Heart size and mediastinal contours are within normal limits. Both lungs are clear.  IMPRESSION: Hiatal hernia. Gas pattern is negative for obstruction or perforation. No acute cardiopulmonary findings.   Electronically Signed   By: Andreas Newport M.D.   On: 06/13/2015 21:21    Scheduled Meds: . [START ON 06/16/2015] enoxaparin (LOVENOX) injection  30 mg Subcutaneous Daily  . hydrocortisone  1 application Rectal BID  . pantoprazole  40 mg Oral Daily  . saccharomyces boulardii  250 mg Oral BID  . sodium chloride  3 mL Intravenous Q12H  . vitamin E  800 Units Oral Daily   Continuous Infusions:  Antibiotics Given (last 72 hours)    Date/Time Action Medication Dose Rate   06/14/15 0700 Given  [IV tubing messed up]   vancomycin (VANCOCIN) IVPB 1000 mg/200 mL premix 1,000 mg 200 mL/hr   06/14/15 1005 Given   vancomycin (VANCOCIN) 50 mg/mL oral solution 125 mg 125 mg    06/14/15 1430 Given   vancomycin (VANCOCIN) 50 mg/mL oral solution 125 mg 125 mg    06/14/15 1831 Given   vancomycin (VANCOCIN) 50 mg/mL oral solution 125 mg 125 mg    06/14/15 2131 Given   vancomycin (VANCOCIN) 50 mg/mL oral solution 125 mg 125 mg    06/15/15 0535 Given   vancomycin (VANCOCIN) IVPB 1000 mg/200 mL premix 1,000 mg 200 mL/hr      Principal Problem:   Altered mental status Active Problems:   HTN (hypertension)   C. difficile colitis   Hyponatremia   GERD (gastroesophageal  reflux disease)   Hyperlipidemia   Fever    Time spent: 54min    Susan Banks  [facility] Pager [phone]. If 7PM-7AM, please contact night-coverage at [email or web address], password [facility] 06/15/2015, 11:13 AM  LOS: 1 day

## 2015-06-16 DIAGNOSIS — K219 Gastro-esophageal reflux disease without esophagitis: Secondary | ICD-10-CM

## 2015-06-16 LAB — CBC WITH DIFFERENTIAL/PLATELET
Basophils Absolute: 0 10*3/uL (ref 0.0–0.1)
Basophils Relative: 0 %
EOS ABS: 0.1 10*3/uL (ref 0.0–0.7)
EOS PCT: 1 %
HCT: 31.7 % — ABNORMAL LOW (ref 36.0–46.0)
Hemoglobin: 10.7 g/dL — ABNORMAL LOW (ref 12.0–15.0)
LYMPHS ABS: 1.8 10*3/uL (ref 0.7–4.0)
Lymphocytes Relative: 20 %
MCH: 30.1 pg (ref 26.0–34.0)
MCHC: 33.8 g/dL (ref 30.0–36.0)
MCV: 89.3 fL (ref 78.0–100.0)
MONOS PCT: 10 %
Monocytes Absolute: 0.9 10*3/uL (ref 0.1–1.0)
Neutro Abs: 6 10*3/uL (ref 1.7–7.7)
Neutrophils Relative %: 69 %
PLATELETS: 255 10*3/uL (ref 150–400)
RBC: 3.55 MIL/uL — AB (ref 3.87–5.11)
RDW: 13.1 % (ref 11.5–15.5)
WBC: 8.8 10*3/uL (ref 4.0–10.5)

## 2015-06-16 LAB — COMPREHENSIVE METABOLIC PANEL
ALK PHOS: 92 U/L (ref 38–126)
ALT: 27 U/L (ref 14–54)
AST: 25 U/L (ref 15–41)
Albumin: 3.3 g/dL — ABNORMAL LOW (ref 3.5–5.0)
Anion gap: 8 (ref 5–15)
BUN: 11 mg/dL (ref 6–20)
CHLORIDE: 105 mmol/L (ref 101–111)
CO2: 23 mmol/L (ref 22–32)
Calcium: 9.3 mg/dL (ref 8.9–10.3)
Creatinine, Ser: 0.97 mg/dL (ref 0.44–1.00)
GFR calc Af Amer: 59 mL/min — ABNORMAL LOW (ref >60)
GFR, EST NON AFRICAN AMERICAN: 51 mL/min — AB (ref >60)
Glucose, Bld: 91 mg/dL (ref 65–99)
Potassium: 3.7 mmol/L (ref 3.5–5.1)
Sodium: 136 mmol/L (ref 135–145)
Total Bilirubin: 0.6 mg/dL (ref 0.3–1.2)
Total Protein: 5.9 g/dL — ABNORMAL LOW (ref 6.5–8.1)

## 2015-06-16 MED ORDER — AMLODIPINE BESYLATE 5 MG PO TABS: 5.0000 mg | ORAL_TABLET | Freq: Every day | ORAL | Status: DC

## 2015-06-16 NOTE — Progress Notes (Signed)
OT Cancellation Note  Patient Details Name: Susan Banks MRN: [id number] DOB: [birth year]   Cancelled Treatment:    Reason Eval/Treat Not Completed: Other (comment) (Pt discharged before OT completed evaluation). Unable to establish a care plan for patient or provide any equipment or discharge recommendations.  Yared Susan , MS, OTR/L, CLT Pager: [phone]  06/16/2015, 9:29 AM

## 2015-06-16 NOTE — Discharge Summary (Signed)
Physician Discharge Summary  Susan Banks NHA:[id number] DOB: [birth year] DOA: 06/13/2015  PCP: Antony Blackbird, MD  Admit date: 06/13/2015 Discharge date: 06/16/2015  Time spent: 45 minutes  Recommendations for Outpatient Follow-up:  1. PCP Dr.Fulp in 1 week, BenicarHCT changed to Amlodipine due to hyponatremia  Discharge Diagnoses:  Principal Problem:   Fever   Metabolic encephalopathy   Hyponatremia   HTN (hypertension)   H/o C. difficile colitis   GERD (gastroesophageal reflux disease)   Hyperlipidemia   Fever   Discharge Condition: stable  Diet recommendation: low sodium  Filed Weights   06/14/15 0050  Weight: 64 kg (141 lb 1.5 oz)    History of present illness:  Chief Complaint: Confusion. HPI: Susan Banks is a 81 y.o. female with a past medical history of hypertension, hyperlipidemia, GERD, chronic kidney disease, was brought to the emergency department after her daughter noticed that she was not at her baseline mental status and was confused. When daughter went to patient's home and she found a change in mental status. The patient was subsequently noticed to be febrile in the emergency department. She was recently treated for C. difficile colitis   Hospital Course:  1. Fever/SIRS -resolved, no clear source of infection, suspect viral illness -afebrile and WBC normal, she was empirically started on IV Vanc and Zosyn on admission, this was stopped in 36hours -Blood CX-NGTD, UA/CXR unremarkable, Abd exam benign -She was back to baseline, without fevers, no focal infectious symptoms and off antibiotics for >24hours -she is advised to FU with PCP in 1 week  2. Metabolic encephalopathy -due to hyponatremia and 1, resolved, back to baseline -no meningeal signs  3. H/o C diff colitis -just completed 4 weeks of PO Vancomycin on 10/6, no further diarrhea, no abd pain, N/V  4. CKD2 -stable, monitor  5. Hyponatremia -chronic from HCTZ, now held -improved with  hydration and stopping HCTZ  6. GERD -PPI  7. Chronic back pain/DJD -denies at pain at this time  8. HTN -BP meds, changed to Amlodipine due to hyponatremia  Discharge Exam: Filed Vitals:   06/16/15 0540  BP: 147/62  Pulse: 70  Temp: 98.1 F (36.7 C)  Resp: 18    General: AAOx3 Cardiovascular: S1S2/RRR Respiratory: CTAB  Discharge Instructions   Discharge Instructions    Diet - low sodium heart healthy    Complete by:  As directed      Increase activity slowly    Complete by:  As directed           Discharge Medication List as of 06/16/2015  8:33 AM    CONTINUE these medications which have NOT CHANGED   Details  atorvastatin (LIPITOR) 20 MG tablet Take 20 mg by mouth at bedtime., Until Discontinued, Historical Med    cholecalciferol (VITAMIN D) 1000 UNITS tablet Take 1,000 Units by mouth daily., Until Discontinued, Historical Med    hydrocortisone (ANUSOL-HC) 2.5 % rectal cream Place 1 application rectally 2 (two) times daily., Until Discontinued, Historical Med    saccharomyces boulardii (FLORASTOR) 250 MG capsule Take 1 capsule (250 mg total) by mouth 2 (two) times daily., Starting 05/22/2015, Until Discontinued, Normal    vitamin E 400 UNIT capsule Take 800 Units by mouth daily., Until Discontinued, Historical Med    olmesartan-hydrochlorothiazide (BENICAR HCT) 40-25 MG per tablet Take 0.5 tablets by mouth daily with supper., Until Discontinued, Historical Med    Polyethyl Glycol-Propyl Glycol (SYSTANE OP) Place 1 drop into both eyes daily as needed (for dry eyes). ,  Until Discontinued, Historical Med      STOP taking these medications     simethicone (MYLICON) 80 MG chewable tablet      vancomycin (VANCOCIN) 50 mg/mL oral solution        Allergies  Allergen Reactions  . Oxycodone Hives  . Tramadol Hcl Hives  . Hydrocodone-Acetaminophen Rash  . Sulfa Antibiotics Nausea And Vomiting   Follow-up Information    Follow up with FULP, CAMMIE, MD.  Schedule an appointment as soon as possible for a visit in 1 week.   Specialty:  Family Medicine   Contact information:   [address] [place] [address] [phone]        The results of significant diagnostics from this hospitalization (including imaging, microbiology, ancillary and laboratory) are listed below for reference.    Significant Diagnostic Studies: Ct Abdomen Pelvis Wo Contrast  05/19/2015   CLINICAL DATA:  81 year old female with left lower quadrant abdominal pain  EXAM: CT ABDOMEN AND PELVIS WITHOUT CONTRAST  TECHNIQUE: Multidetector CT imaging of the abdomen and pelvis was performed following the standard protocol without IV contrast.  COMPARISON:  None.  FINDINGS: Evaluation of this exam is limited in the absence of intravenous contrast.  The visualized lung bases are clear. Calcified mitral annulus noted.  No intra-abdominal free air or free fluid.  Layering sludge versus small stones noted within the gallbladder. No pericholecystic fluid. The liver, pancreas, spleen, adrenal glands appear unremarkable. There is mild bilateral renal parenchymal atrophy. There is no hydronephrosis or nephrolithiasis on either side. The visualized ureters and urinary bladder appear unremarkable. The uterus and visualized right ovary appear grossly unremarkable.  There is a large partially visualized hiatal hernia. There is no evidence of bowel obstruction. There is thickening of the rectal wall and rectosigmoid possibly related to proctocolitis. Clinical correlation is recommended. Liquid stool noted within the rectum. The appendix is not visualized with certainty. No inflammatory changes identified in the right lower quadrant.  There is aortoiliac atherosclerotic disease. No portal venous gas identified. There is no adenopathy. Midline vertical anterior pelvic wall incisional scar. There is osteopenia with degenerative changes of the osseous structures and spine. No acute fracture.   IMPRESSION: Circumferential thickening of the rectosigmoid concerning for proctocolitis. Clinical correlation recommended. No other acute intra-abdominal or pelvic pathology identified.   Electronically Signed   By: Anner Crete M.D.   On: 05/19/2015 23:14   Ct Head Wo Contrast  06/13/2015   CLINICAL DATA:  Inability to speak.  Nausea and vomiting.  EXAM: CT HEAD WITHOUT CONTRAST  TECHNIQUE: Contiguous axial images were obtained from the base of the skull through the vertex without intravenous contrast.  COMPARISON:  None.  FINDINGS: Global atrophy appropriate to age. Mild chronic ischemic changes in the periventricular white matter. No mass effect, midline shift, or acute hemorrhage. Mastoid air cells are clear. Cranium is intact.  IMPRESSION: No acute intracranial pathology.   Electronically Signed   By: Marybelle Killings M.D.   On: 06/13/2015 20:09   Dg Abd Acute W/chest  06/13/2015   CLINICAL DATA:  Altered mental status.  EXAM: DG ABDOMEN ACUTE W/ 1V CHEST  COMPARISON:  05/19/2015 CT  FINDINGS: There is no evidence of dilated bowel loops or free intraperitoneal air. No radiopaque calculi or other significant radiographic abnormality is seen. There is a hiatal hernia. Heart size and mediastinal contours are within normal limits. Both lungs are clear.  IMPRESSION: Hiatal hernia. Gas pattern is negative for obstruction or perforation. No acute  cardiopulmonary findings.   Electronically Signed   By: Andreas Newport M.D.   On: 06/13/2015 21:21    Microbiology: Recent Results (from the past 240 hour(s))  Blood culture (routine x 2)     Status: None (Preliminary result)   Collection Time: 06/13/15  7:45 PM  Result Value Ref Range Status   Specimen Description BLOOD RIGHT ANTECUBITAL  Final   Special Requests BOTTLES DRAWN AEROBIC AND ANAEROBIC 5CC  Final   Culture   Final    NO GROWTH 3 DAYS Performed at [facility]    Report Status PENDING  Incomplete  Blood culture (routine x 2)      Status: None (Preliminary result)   Collection Time: 06/14/15  2:47 AM  Result Value Ref Range Status   Specimen Description BLOOD BLOOD RIGHT HAND  Final   Special Requests IN PEDIATRIC BOTTLE 4CC  Final   Culture   Final    NO GROWTH 2 DAYS Performed at [facility]    Report Status PENDING  Incomplete     Labs: Basic Metabolic Panel:  Recent Labs Lab 06/13/15 1945 06/13/15 1953 06/14/15 0247 06/15/15 0550 06/16/15 0518  NA 126* 126* 126* 132* 136  K 4.1 4.2 4.1 3.9 3.7  CL 91* 91* 99* 106 105  CO2 23  --  22 20* 23  GLUCOSE 155* 149* 105* 95 91  BUN 23* 22* 22* 14 11  CREATININE 1.16* 1.20* 1.16* 1.19* 0.97  CALCIUM 10.0  --  8.8* 8.7* 9.3  MG  --   --  1.5*  --   --   PHOS  --   --  2.6  --   --    Liver Function Tests:  Recent Labs Lab 06/13/15 1945 06/14/15 0247 06/15/15 0550 06/16/15 0518  AST 35 29 26 25   ALT 35 26 24 27   ALKPHOS 136* 106 86 92  BILITOT 0.3 0.5 0.9 0.6  PROT 7.3 5.7* 5.2* 5.9*  ALBUMIN 4.3 3.3* 3.1* 3.3*   No results for input(s): LIPASE, AMYLASE in the last 168 hours. No results for input(s): AMMONIA in the last 168 hours. CBC:  Recent Labs Lab 06/13/15 1945 06/13/15 1953 06/14/15 0247 06/15/15 0550 06/16/15 0518  WBC 11.9*  --  11.9* 8.8 8.8  NEUTROABS 9.8*  --  9.4* 5.7 6.0  HGB 12.3 13.3 10.6* 9.4* 10.7*  HCT 36.2 39.0 29.9* 27.8* 31.7*  MCV 86.6  --  86.4 87.4 89.3  PLT 315  --  257 223 255   Cardiac Enzymes: No results for input(s): CKTOTAL, CKMB, CKMBINDEX, TROPONINI in the last 168 hours. BNP: BNP (last 3 results) No results for input(s): BNP in the last 8760 hours.  ProBNP (last 3 results) No results for input(s): PROBNP in the last 8760 hours.  CBG:  Recent Labs Lab 06/13/15 1941  GLUCAP 152*       Signed:  Elianne Gubser  [facility] 06/16/2015, 5:51 PM

## 2015-06-16 NOTE — Progress Notes (Signed)
Pt discharged to home. DC instructions given with daughter at bedside. No concerns voiced. prescription x 1 given for bp med. Left unit in wheelchair pushed by nurse tech. Left in good condition. Vwilliams, rn.

## 2015-06-18 LAB — CULTURE, BLOOD (ROUTINE X 2): CULTURE: NO GROWTH

## 2015-06-19 LAB — CULTURE, BLOOD (ROUTINE X 2): CULTURE: NO GROWTH

## 2015-11-17 ENCOUNTER — Encounter (HOSPITAL_COMMUNITY): Payer: Self-pay | Admitting: *Deleted

## 2015-11-17 ENCOUNTER — Emergency Department (HOSPITAL_COMMUNITY)
Admission: EM | Admit: 2015-11-17 | Discharge: 2015-11-17 | Disposition: A | Discharge status: Home / Self Care | Payer: Medicare Other | Attending: Emergency Medicine | Admitting: Emergency Medicine

## 2015-11-17 DIAGNOSIS — I129 Hypertensive chronic kidney disease with stage 1 through stage 4 chronic kidney disease, or unspecified chronic kidney disease: Secondary | ICD-10-CM | POA: Diagnosis not present

## 2015-11-17 DIAGNOSIS — Z79899 Other long term (current) drug therapy: Secondary | ICD-10-CM | POA: Insufficient documentation

## 2015-11-17 DIAGNOSIS — K219 Gastro-esophageal reflux disease without esophagitis: Secondary | ICD-10-CM | POA: Insufficient documentation

## 2015-11-17 DIAGNOSIS — Z87891 Personal history of nicotine dependence: Secondary | ICD-10-CM | POA: Diagnosis not present

## 2015-11-17 DIAGNOSIS — A047 Enterocolitis due to Clostridium difficile: Secondary | ICD-10-CM | POA: Insufficient documentation

## 2015-11-17 DIAGNOSIS — E7800 Pure hypercholesterolemia, unspecified: Secondary | ICD-10-CM | POA: Diagnosis not present

## 2015-11-17 DIAGNOSIS — N189 Chronic kidney disease, unspecified: Secondary | ICD-10-CM | POA: Diagnosis not present

## 2015-11-17 DIAGNOSIS — N390 Urinary tract infection, site not specified: Secondary | ICD-10-CM | POA: Diagnosis not present

## 2015-11-17 DIAGNOSIS — A0472 Enterocolitis due to Clostridium difficile, not specified as recurrent: Secondary | ICD-10-CM

## 2015-11-17 DIAGNOSIS — R197 Diarrhea, unspecified: Secondary | ICD-10-CM | POA: Diagnosis present

## 2015-11-17 HISTORY — DX: Enterocolitis due to Clostridium difficile, not specified as recurrent: A04.72

## 2015-11-17 LAB — C DIFFICILE QUICK SCREEN W PCR REFLEX
C DIFFICILE (CDIFF) TOXIN: NEGATIVE
C DIFFICLE (CDIFF) ANTIGEN: POSITIVE — AB

## 2015-11-17 LAB — CBC
HEMATOCRIT: 36.7 % (ref 36.0–46.0)
Hemoglobin: 12.4 g/dL (ref 12.0–15.0)
MCH: 28.2 pg (ref 26.0–34.0)
MCHC: 33.8 g/dL (ref 30.0–36.0)
MCV: 83.4 fL (ref 78.0–100.0)
PLATELETS: 254 10*3/uL (ref 150–400)
RBC: 4.4 MIL/uL (ref 3.87–5.11)
RDW: 13 % (ref 11.5–15.5)
WBC: 10.2 10*3/uL (ref 4.0–10.5)

## 2015-11-17 LAB — URINE MICROSCOPIC-ADD ON

## 2015-11-17 LAB — COMPREHENSIVE METABOLIC PANEL
ALK PHOS: 136 U/L — AB (ref 38–126)
ALT: 13 U/L — ABNORMAL LOW (ref 14–54)
AST: 23 U/L (ref 15–41)
Albumin: 4.1 g/dL (ref 3.5–5.0)
Anion gap: 11 (ref 5–15)
BILIRUBIN TOTAL: 0.8 mg/dL (ref 0.3–1.2)
BUN: 23 mg/dL — AB (ref 6–20)
CALCIUM: 10.4 mg/dL — AB (ref 8.9–10.3)
CO2: 24 mmol/L (ref 22–32)
Chloride: 106 mmol/L (ref 101–111)
Creatinine, Ser: 1.21 mg/dL — ABNORMAL HIGH (ref 0.44–1.00)
GFR calc Af Amer: 45 mL/min — ABNORMAL LOW (ref >60)
GFR, EST NON AFRICAN AMERICAN: 39 mL/min — AB (ref >60)
GLUCOSE: 112 mg/dL — AB (ref 65–99)
POTASSIUM: 3.9 mmol/L (ref 3.5–5.1)
Sodium: 141 mmol/L (ref 135–145)
TOTAL PROTEIN: 7.4 g/dL (ref 6.5–8.1)

## 2015-11-17 LAB — URINALYSIS, ROUTINE W REFLEX MICROSCOPIC
Glucose, UA: NEGATIVE mg/dL
KETONES UR: NEGATIVE mg/dL
NITRITE: NEGATIVE
PH: 5.5 (ref 5.0–8.0)
PROTEIN: NEGATIVE mg/dL
Specific Gravity, Urine: 1.024 (ref 1.005–1.030)

## 2015-11-17 LAB — LIPASE, BLOOD: Lipase: 24 U/L (ref 11–51)

## 2015-11-17 MED ORDER — METRONIDAZOLE 500 MG PO TABS: 500.0000 mg | ORAL_TABLET | Freq: Two times a day (BID) | ORAL | Status: AC

## 2015-11-17 MED ORDER — CEPHALEXIN 500 MG PO CAPS: 500.0000 mg | ORAL_CAPSULE | Freq: Three times a day (TID) | ORAL | Status: AC

## 2015-11-17 MED ORDER — SODIUM CHLORIDE 0.9 % IV BOLUS (SEPSIS)
1000.0000 mL | Freq: Once | INTRAVENOUS | Status: AC
  Administered 2015-11-17: 1000 mL via INTRAVENOUS

## 2015-11-17 MED ORDER — ONDANSETRON 4 MG PO TBDP: 4.0000 mg | ORAL_TABLET | Freq: Once | ORAL | Status: DC | PRN

## 2015-11-17 MED ORDER — ONDANSETRON 4 MG PO TBDP
4.0000 mg | ORAL_TABLET | Freq: Three times a day (TID) | ORAL | Status: DC | PRN
Start: 2015-11-17 — End: 2017-08-16

## 2015-11-17 NOTE — ED Provider Notes (Signed)
CSN: [id number]     Arrival date & time 11/17/15  1055 History   First MD Initiated Contact with Patient 11/17/15 1259     Chief Complaint  Patient presents with  . Diarrhea     (Consider location/radiation/quality/duration/timing/severity/associated sxs/prior Treatment) Patient is a 82 y.o. female presenting with diarrhea.  Diarrhea Quality:  Watery (yellow) Number of episodes:  10 per day Duration:  4 days Timing:  Constant Progression:  Improving Ineffective treatments: probiotics. Associated symptoms: chills and cough (mild, chronic, unchnaged from prior)   Associated symptoms: no abdominal pain, no fever, no headaches, no URI and no vomiting  Diaphoresis: '\   Risk factors: no recent antibiotic use   Risk factors comment:  Hx of cdiff   Past Medical History  Diagnosis Date  . Hypertension   . Shortness of breath   . Chronic kidney disease   . GERD (gastroesophageal reflux disease)   . Constipation   . Hypercholesteremia   . C. difficile diarrhea    Past Surgical History  Procedure Laterality Date  . Tee without cardioversion N/A 06/13/2014    Procedure: TRANSESOPHAGEAL ECHOCARDIOGRAM (TEE);  Surgeon: Laverda Page, MD;  Location: [facility] ENDOSCOPY;  Service: Cardiovascular;  Laterality: N/A;  . Esophagogastroduodenoscopy N/A 07/04/2014    Procedure: ESOPHAGOGASTRODUODENOSCOPY (EGD);  Surgeon: Laverda Page, MD;  Location: [place];  Service: Cardiovascular;  Laterality: N/A;  . Abdominal surgery     History reviewed. No pertinent family history. Social History  Substance Use Topics  . Smoking status: Former Smoker    Quit date: 06/14/1983  . Smokeless tobacco: None  . Alcohol Use: No   OB History    No data available     Review of Systems  Constitutional: Positive for chills. Negative for fever. Diaphoresis: '\  HENT: Negative for sore throat.   Eyes: Negative for visual disturbance.  Respiratory: Negative for cough and shortness of breath.    Cardiovascular: Negative for chest pain.  Gastrointestinal: Positive for nausea and diarrhea. Negative for vomiting and abdominal pain.  Genitourinary: Positive for dysuria. Negative for flank pain and difficulty urinating.  Musculoskeletal: Negative for back pain and neck pain.  Skin: Negative for rash.  Neurological: Negative for syncope and headaches.      Allergies  Codeine; Tramadol hcl; and Sulfa antibiotics  Home Medications   Prior to Admission medications   Medication Sig Start Date End Date Taking? Authorizing Provider  amLODipine (NORVASC) 5 MG tablet Take 1 tablet (5 mg total) by mouth daily. Patient taking differently: Take 2.5 mg by mouth daily.  06/16/15  Yes Domenic Polite, MD  atorvastatin (LIPITOR) 20 MG tablet Take 20 mg by mouth at bedtime.   Yes Historical Provider, MD  cholecalciferol (VITAMIN D) 1000 UNITS tablet Take 1,000 Units by mouth daily.   Yes Historical Provider, MD  omeprazole (PRILOSEC) 20 MG capsule Take 20 mg by mouth daily.   Yes Historical Provider, MD  saccharomyces boulardii (FLORASTOR) 250 MG capsule Take 1 capsule (250 mg total) by mouth 2 (two) times daily. 05/22/15  Yes Janece Canterbury, MD  cephALEXin (KEFLEX) 500 MG capsule Take 1 capsule (500 mg total) by mouth 3 (three) times daily. 11/17/15 11/24/15  Gareth Morgan, MD  metroNIDAZOLE (FLAGYL) 500 MG tablet Take 1 tablet (500 mg total) by mouth 2 (two) times daily. 11/17/15 12/01/15  Gareth Morgan, MD  ondansetron (ZOFRAN ODT) 4 MG disintegrating tablet Take 1 tablet (4 mg total) by mouth every 8 (eight) hours as needed for nausea or  vomiting. 11/17/15   Gareth Morgan, MD   BP 129/63 mmHg  Pulse 90  Temp(Src) 97.4 F (36.3 C) (Oral)  Resp 17  SpO2 98% Physical Exam  Constitutional: She is oriented to person, place, and time. She appears well-developed and well-nourished. No distress.  HENT:  Head: Normocephalic and atraumatic.  Eyes: Conjunctivae and EOM are normal.  Neck: Normal  range of motion.  Cardiovascular: Normal rate, regular rhythm, normal heart sounds and intact distal pulses.  Exam reveals no gallop and no friction rub.   No murmur heard. Pulmonary/Chest: Effort normal and breath sounds normal. No respiratory distress. She has no wheezes. She has no rales.  Abdominal: Soft. She exhibits no distension. There is no tenderness. There is no guarding.  Musculoskeletal: She exhibits no edema or tenderness.  Neurological: She is alert and oriented to person, place, and time.  Skin: Skin is warm and dry. No rash noted. She is not diaphoretic. No erythema.  Nursing note and vitals reviewed.   ED Course  Procedures (including critical care time) Labs Review Labs Reviewed  C DIFFICILE QUICK SCREEN W PCR REFLEX - Abnormal; Notable for the following:    C Diff antigen POSITIVE (*)    All other components within normal limits  COMPREHENSIVE METABOLIC PANEL - Abnormal; Notable for the following:    Glucose, Bld 112 (*)    BUN 23 (*)    Creatinine, Ser 1.21 (*)    Calcium 10.4 (*)    ALT 13 (*)    Alkaline Phosphatase 136 (*)    GFR calc non Af Amer 39 (*)    GFR calc Af Amer 45 (*)    All other components within normal limits  URINALYSIS, ROUTINE W REFLEX MICROSCOPIC (NOT AT [facility]) - Abnormal; Notable for the following:    Color, Urine AMBER (*)    APPearance CLOUDY (*)    Hgb urine dipstick TRACE (*)    Bilirubin Urine SMALL (*)    Leukocytes, UA MODERATE (*)    All other components within normal limits  URINE MICROSCOPIC-ADD ON - Abnormal; Notable for the following:    Squamous Epithelial / LPF 6-30 (*)    Bacteria, UA MANY (*)    Casts HYALINE CASTS (*)    Crystals CA OXALATE CRYSTALS (*)    All other components within normal limits  LIPASE, BLOOD  CBC    Imaging Review No results found. I have personally reviewed and evaluated these images and lab results as part of my medical decision-making.   EKG Interpretation None      MDM   Final  diagnoses:  C. difficile diarrhea  UTI (lower urinary tract infection)   82 year old female with a history of hypertension, chronic kidney disease, hyperlipidemia, C. difficile diarrhea in September 2016 presents with concern of recurrence of diarrhea and dysuria. No abdominal pain and abdominal exam benign and doubt acute intraabdominal process.  Creatinine elevated from most recent measurement October 8 of 0.97 date of 1.2, however is noted to be similar to multiple prior creatinine levels, and doubt significant acute kidney injury.  Potassium and sodium WNL.  Patient with normal vital signs, without significant dehydration on exam and decreased diarrhea today. Given amount of diarrhea she has experienced over last several days, pt given IV fluids.  Patient reports dysuria, and urinalysis consistent with likely UTI. No flank pain, no fevers.  Patient positive for C Diff.  Patient will be treated with Keflex for UTI for 1 week, and metronidazole for C.  difficile for 2 weeks. Recommend close PCP follow-up and return to the emergency department for worsening symptoms. The patient is stable for discharge and outpatient antibiotics.  Patient discharged in stable condition with understanding of reasons to return.   Gareth Morgan, MD 11/17/15 1625

## 2015-11-17 NOTE — ED Notes (Signed)
Pt admits to nausea and diarrhea x3 days, admits to hx of c-diff in 05/2016 - pt admits to mild diffuse abd pain. Denies fever or vomiting.

## 2015-12-05 ENCOUNTER — Ambulatory Visit: Payer: Medicare Other | Admitting: Neurology

## 2016-07-03 ENCOUNTER — Other Ambulatory Visit: Payer: Self-pay | Admitting: Family Medicine

## 2016-07-03 DIAGNOSIS — Z1231 Encounter for screening mammogram for malignant neoplasm of breast: Secondary | ICD-10-CM

## 2016-08-18 ENCOUNTER — Ambulatory Visit
Admission: RE | Admit: 2016-08-18 | Discharge: 2016-08-18 | Disposition: A | Discharge status: Home / Self Care | Payer: Medicare Other | Source: Ambulatory Visit | Attending: Family Medicine | Admitting: Family Medicine

## 2016-08-18 DIAGNOSIS — Z1231 Encounter for screening mammogram for malignant neoplasm of breast: Secondary | ICD-10-CM

## 2017-07-27 ENCOUNTER — Other Ambulatory Visit: Payer: Self-pay | Admitting: Orthopedic Surgery

## 2017-07-27 DIAGNOSIS — M48061 Spinal stenosis, lumbar region without neurogenic claudication: Secondary | ICD-10-CM

## 2017-08-03 ENCOUNTER — Other Ambulatory Visit: Payer: Self-pay | Admitting: Family Medicine

## 2017-08-03 DIAGNOSIS — R413 Other amnesia: Secondary | ICD-10-CM

## 2017-08-03 DIAGNOSIS — R410 Disorientation, unspecified: Secondary | ICD-10-CM

## 2017-08-05 ENCOUNTER — Ambulatory Visit
Admission: RE | Admit: 2017-08-05 | Discharge: 2017-08-05 | Disposition: A | Discharge status: Home / Self Care | Payer: Medicare Other | Source: Ambulatory Visit | Attending: Family Medicine | Admitting: Family Medicine

## 2017-08-05 ENCOUNTER — Ambulatory Visit
Admission: RE | Admit: 2017-08-05 | Discharge: 2017-08-05 | Disposition: A | Discharge status: Home / Self Care | Payer: Medicare Other | Source: Ambulatory Visit | Attending: Orthopedic Surgery | Admitting: Orthopedic Surgery

## 2017-08-05 DIAGNOSIS — R410 Disorientation, unspecified: Secondary | ICD-10-CM

## 2017-08-05 DIAGNOSIS — R413 Other amnesia: Secondary | ICD-10-CM

## 2017-08-05 DIAGNOSIS — M48061 Spinal stenosis, lumbar region without neurogenic claudication: Secondary | ICD-10-CM

## 2017-08-05 MED ORDER — IOPAMIDOL (ISOVUE-M 200) INJECTION 41%
1.0000 mL | Freq: Once | INTRAMUSCULAR | Status: AC
Start: 2017-08-05 — End: 2017-08-05
  Administered 2017-08-05: 1 mL via EPIDURAL

## 2017-08-05 MED ORDER — METHYLPREDNISOLONE ACETATE 40 MG/ML INJ SUSP (RADIOLOG
120.0000 mg | Freq: Once | INTRAMUSCULAR | Status: AC
  Administered 2017-08-05: 120 mg via EPIDURAL

## 2017-08-05 NOTE — Discharge Instructions (Signed)

## 2017-08-08 DIAGNOSIS — I82402 Acute embolism and thrombosis of unspecified deep veins of left lower extremity: Secondary | ICD-10-CM

## 2017-08-08 DIAGNOSIS — I2699 Other pulmonary embolism without acute cor pulmonale: Secondary | ICD-10-CM

## 2017-08-08 HISTORY — DX: Other pulmonary embolism without acute cor pulmonale: I26.99

## 2017-08-08 HISTORY — DX: Acute embolism and thrombosis of unspecified deep veins of left lower extremity: I82.402

## 2017-08-15 ENCOUNTER — Encounter: Payer: Self-pay | Admitting: Emergency Medicine

## 2017-08-15 ENCOUNTER — Emergency Department: Payer: Medicare Other

## 2017-08-15 ENCOUNTER — Inpatient Hospital Stay
Admission: EM | Admit: 2017-08-15 | Discharge: 2017-08-16 | DRG: 176 | Disposition: A | Discharge status: Home / Self Care | Payer: Medicare Other | Attending: Internal Medicine | Admitting: Internal Medicine

## 2017-08-15 ENCOUNTER — Inpatient Hospital Stay: Payer: Medicare Other

## 2017-08-15 ENCOUNTER — Other Ambulatory Visit: Payer: Self-pay

## 2017-08-15 DIAGNOSIS — M549 Dorsalgia, unspecified: Secondary | ICD-10-CM | POA: Diagnosis present

## 2017-08-15 DIAGNOSIS — Z79899 Other long term (current) drug therapy: Secondary | ICD-10-CM | POA: Diagnosis not present

## 2017-08-15 DIAGNOSIS — E785 Hyperlipidemia, unspecified: Secondary | ICD-10-CM | POA: Diagnosis not present

## 2017-08-15 DIAGNOSIS — Z8619 Personal history of other infectious and parasitic diseases: Secondary | ICD-10-CM

## 2017-08-15 DIAGNOSIS — I129 Hypertensive chronic kidney disease with stage 1 through stage 4 chronic kidney disease, or unspecified chronic kidney disease: Secondary | ICD-10-CM | POA: Diagnosis present

## 2017-08-15 DIAGNOSIS — M1990 Unspecified osteoarthritis, unspecified site: Secondary | ICD-10-CM | POA: Diagnosis not present

## 2017-08-15 DIAGNOSIS — M79606 Pain in leg, unspecified: Secondary | ICD-10-CM

## 2017-08-15 DIAGNOSIS — I2699 Other pulmonary embolism without acute cor pulmonale: Secondary | ICD-10-CM | POA: Diagnosis not present

## 2017-08-15 DIAGNOSIS — Z87891 Personal history of nicotine dependence: Secondary | ICD-10-CM | POA: Diagnosis not present

## 2017-08-15 DIAGNOSIS — I82402 Acute embolism and thrombosis of unspecified deep veins of left lower extremity: Secondary | ICD-10-CM | POA: Diagnosis present

## 2017-08-15 DIAGNOSIS — Z885 Allergy status to narcotic agent status: Secondary | ICD-10-CM

## 2017-08-15 DIAGNOSIS — Z882 Allergy status to sulfonamides status: Secondary | ICD-10-CM

## 2017-08-15 DIAGNOSIS — R0609 Other forms of dyspnea: Secondary | ICD-10-CM

## 2017-08-15 DIAGNOSIS — N183 Chronic kidney disease, stage 3 (moderate): Secondary | ICD-10-CM | POA: Diagnosis not present

## 2017-08-15 DIAGNOSIS — K219 Gastro-esophageal reflux disease without esophagitis: Secondary | ICD-10-CM | POA: Diagnosis present

## 2017-08-15 DIAGNOSIS — M25512 Pain in left shoulder: Secondary | ICD-10-CM | POA: Diagnosis not present

## 2017-08-15 DIAGNOSIS — R402413 Glasgow coma scale score 13-15, at hospital admission: Secondary | ICD-10-CM | POA: Diagnosis not present

## 2017-08-15 LAB — BASIC METABOLIC PANEL
ANION GAP: 10 (ref 5–15)
BUN: 26 mg/dL — ABNORMAL HIGH (ref 6–20)
CHLORIDE: 106 mmol/L (ref 101–111)
CO2: 23 mmol/L (ref 22–32)
Calcium: 10.3 mg/dL (ref 8.9–10.3)
Creatinine, Ser: 1 mg/dL (ref 0.44–1.00)
GFR, EST AFRICAN AMERICAN: 56 mL/min — AB (ref >60)
GFR, EST NON AFRICAN AMERICAN: 48 mL/min — AB (ref >60)
Glucose, Bld: 116 mg/dL — ABNORMAL HIGH (ref 65–99)
POTASSIUM: 3.9 mmol/L (ref 3.5–5.1)
SODIUM: 139 mmol/L (ref 135–145)

## 2017-08-15 LAB — CBC
HEMATOCRIT: 42.9 % (ref 35.0–47.0)
HEMOGLOBIN: 14.4 g/dL (ref 12.0–16.0)
MCH: 30 pg (ref 26.0–34.0)
MCHC: 33.6 g/dL (ref 32.0–36.0)
MCV: 89.5 fL (ref 80.0–100.0)
Platelets: 207 10*3/uL (ref 150–440)
RBC: 4.8 MIL/uL (ref 3.80–5.20)
RDW: 14.2 % (ref 11.5–14.5)
WBC: 12.7 10*3/uL — AB (ref 3.6–11.0)

## 2017-08-15 LAB — TROPONIN I

## 2017-08-15 MED ORDER — ONDANSETRON HCL 4 MG PO TABS: 4.0000 mg | ORAL_TABLET | Freq: Four times a day (QID) | ORAL | Status: DC | PRN

## 2017-08-15 MED ORDER — TRAZODONE HCL 50 MG PO TABS
25.0000 mg | ORAL_TABLET | Freq: Every evening | ORAL | Status: DC | PRN
  Administered 2017-08-15: 25 mg via ORAL
  Filled 2017-08-15: qty 1

## 2017-08-15 MED ORDER — DOCUSATE SODIUM 100 MG PO CAPS
100.0000 mg | ORAL_CAPSULE | Freq: Two times a day (BID) | ORAL | Status: DC
  Administered 2017-08-15 – 2017-08-16 (×2): 100 mg via ORAL
  Filled 2017-08-15 (×2): qty 1

## 2017-08-15 MED ORDER — BISACODYL 5 MG PO TBEC
5.0000 mg | DELAYED_RELEASE_TABLET | Freq: Every day | ORAL | Status: DC | PRN
  Filled 2017-08-15: qty 1

## 2017-08-15 MED ORDER — ONDANSETRON HCL 4 MG/2ML IJ SOLN: 4.0000 mg | Freq: Four times a day (QID) | INTRAMUSCULAR | Status: DC | PRN

## 2017-08-15 MED ORDER — IOPAMIDOL (ISOVUE-370) INJECTION 76%
75.0000 mL | Freq: Once | INTRAVENOUS | Status: AC | PRN
  Administered 2017-08-15: 75 mL via INTRAVENOUS

## 2017-08-15 MED ORDER — APIXABAN 5 MG PO TABS
10.0000 mg | ORAL_TABLET | Freq: Two times a day (BID) | ORAL | Status: DC
  Administered 2017-08-16: 10 mg via ORAL
  Filled 2017-08-15: qty 2

## 2017-08-15 MED ORDER — SODIUM CHLORIDE 0.9 % IV SOLN
Freq: Once | INTRAVENOUS | Status: AC
  Administered 2017-08-15: 16:00:00 via INTRAVENOUS

## 2017-08-15 MED ORDER — ACETAMINOPHEN 325 MG PO TABS
650.0000 mg | ORAL_TABLET | Freq: Four times a day (QID) | ORAL | Status: DC | PRN
  Administered 2017-08-15 – 2017-08-16 (×2): 650 mg via ORAL
  Filled 2017-08-15 (×2): qty 2

## 2017-08-15 MED ORDER — ACETAMINOPHEN 650 MG RE SUPP: 650.0000 mg | Freq: Four times a day (QID) | RECTAL | Status: DC | PRN

## 2017-08-15 MED ORDER — APIXABAN 5 MG PO TABS: 5.0000 mg | ORAL_TABLET | Freq: Two times a day (BID) | ORAL | Status: DC

## 2017-08-15 MED ORDER — APIXABAN 5 MG PO TABS
10.0000 mg | ORAL_TABLET | Freq: Once | ORAL | Status: AC
  Administered 2017-08-15: 10 mg via ORAL
  Filled 2017-08-15: qty 2

## 2017-08-15 NOTE — ED Triage Notes (Signed)
C/O left shoulder and axilla pain with a "lump" in axilla.  Also c/o SOB.  Onset of symptoms this morning at around 1000.

## 2017-08-15 NOTE — ED Provider Notes (Signed)
[facility] Emergency Department Provider Note       Time seen: ----------------------------------------- 3:21 PM on 08/15/2017 -----------------------------------------   I have reviewed the triage vital signs and the nursing notes.  HISTORY   Chief Complaint Shortness of Breath    HPI Susan Banks is a 83 y.o. female with a history of hypertension, diarrhea, chronic kidney disease who presents to the ED for left shoulder and axillary pain with a reported lump in the axilla.  She also complains of shortness of breath which has significantly worsened.  She has had dyspnea for the past 2 years with no clear diagnosis according to her.  She has seen her doctor for shortness of breath but never for the axillary pain before.  She does have a history of arthritis.  Symptoms this morning were around 10 AM.  She denies any recent illness or other complaints.  Past Medical History:  Diagnosis Date  . C. difficile diarrhea   . Chronic kidney disease   . Constipation   . GERD (gastroesophageal reflux disease)   . Hypercholesteremia   . Hypertension   . Shortness of breath     Patient Active Problem List   Diagnosis Date Noted  . Hyperlipidemia 06/14/2015  . Fever 06/14/2015  . Altered mental status 06/13/2015  . Hyponatremia 05/22/2015  . Normocytic anemia 05/22/2015  . GERD (gastroesophageal reflux disease) 05/22/2015  . Diarrhea 05/20/2015  . HTN (hypertension) 05/20/2015  . Chronic kidney disease, stage 3 ([place]) 05/20/2015  . C. difficile colitis 05/20/2015  . Proctocolitis     Past Surgical History:  Procedure Laterality Date  . ABDOMINAL SURGERY    . ESOPHAGOGASTRODUODENOSCOPY N/A 07/04/2014   Procedure: ESOPHAGOGASTRODUODENOSCOPY (EGD);  Surgeon: Laverda Page, MD;  Location: [place];  Service: Cardiovascular;  Laterality: N/A;  . TEE WITHOUT CARDIOVERSION N/A 06/13/2014   Procedure: TRANSESOPHAGEAL ECHOCARDIOGRAM (TEE);  Surgeon:  Laverda Page, MD;  Location: [facility] ENDOSCOPY;  Service: Cardiovascular;  Laterality: N/A;    Allergies Codeine; Sulfa antibiotics; and Tramadol hcl  Social History Social History   Tobacco Use  . Smoking status: Former Smoker    Last attempt to quit: 06/14/1983    Years since quitting: 34.1  . Smokeless tobacco: Never Used  Substance Use Topics  . Alcohol use: No  . Drug use: No    Review of Systems Constitutional: Negative for fever. Cardiovascular: Positive for chest pain Respiratory: Positive for difficulty breathing Gastrointestinal: Negative for abdominal pain, vomiting and diarrhea. Genitourinary: Negative for dysuria. Musculoskeletal: Negative for back pain. Skin: Negative for rash. Neurological: Negative for headaches, focal weakness or numbness. Psychiatric: Positive for anxiety and increased stress  All systems negative/normal/unremarkable except as stated in the HPI  ____________________________________________   PHYSICAL EXAM:  VITAL SIGNS: ED Triage Vitals  Enc Vitals Group     BP 08/15/17 1345 (!) 153/79     Pulse Rate 08/15/17 1345 92     Resp 08/15/17 1345 16     Temp 08/15/17 1345 98 F (36.7 C)     Temp Source 08/15/17 1345 Oral     SpO2 08/15/17 1345 97 %     Weight 08/15/17 1343 124 lb (56.2 kg)     Height 08/15/17 1343 5\' 4"  (1.626 m)     Head Circumference --      Peak Flow --      Pain Score 08/15/17 1342 0     Pain Loc --      Pain Edu? --  Excl. in [place]? --     Constitutional: Alert and oriented. Well appearing and in no distress. Eyes: Conjunctivae are normal. Normal extraocular movements. ENT   Head: Normocephalic and atraumatic.   Nose: No congestion/rhinnorhea.   Mouth/Throat: Mucous membranes are moist.   Neck: No stridor. Cardiovascular: Normal rate, regular rhythm. No murmurs, rubs, or gallops. Respiratory: Normal respiratory effort without tachypnea nor retractions. Breath sounds are clear and equal  bilaterally. No wheezes/rales/rhonchi. Gastrointestinal: Soft and nontender. Normal bowel sounds Musculoskeletal: Nontender with normal range of motion in extremities. No lower extremity tenderness nor edema.  No axillary adenopathy is noted Neurologic:  Normal speech and language. No gross focal neurologic deficits are appreciated.  Skin:  Skin is warm, dry and intact. No rash noted. Psychiatric: Depressed mood and affect ____________________________________________  EKG: Interpreted by me.  Sinus rhythm, left anterior fascicular block, normal QRS size, normal QT.  ____________________________________________  ED COURSE:  Pertinent labs & imaging results that were available during my care of the patient were reviewed by me and considered in my medical decision making (see chart for details). Patient presents for chest pain shortness of breath, we will assess with labs and imaging as indicated.   Procedures ____________________________________________   LABS (pertinent positives/negatives)  Labs Reviewed  BASIC METABOLIC PANEL - Abnormal; Notable for the following components:      Result Value   Glucose, Bld 116 (*)    BUN 26 (*)    GFR calc non Af Amer 48 (*)    GFR calc Af Amer 56 (*)    All other components within normal limits  CBC - Abnormal; Notable for the following components:   WBC 12.7 (*)    All other components within normal limits  TROPONIN I  TROPONIN I    RADIOLOGY Images were viewed by me  Chest x-ray CTA of the chest IMPRESSION: 1. Bilateral nonocclusive pulmonary emboli, more numerous on the right, as detailed above. No evidence of pulmonary infarction. No findings of right heart strain. 2. No other acute abnormalities in the chest. No evidence of pneumonia or pulmonary edema. 3. Coronary artery calcifications. Minor aortic atherosclerosis. 4. Moderate to large hiatal hernia.  Aortic Atherosclerosis  (ICD10-I70.0). ____________________________________________ CRITICAL CARE Performed by: Earleen Newport   Total critical care time: 30 minutes  Critical care time was exclusive of separately billable procedures and treating other patients.  Critical care was necessary to treat or prevent imminent or life-threatening deterioration.  Critical care was time spent personally by me on the following activities: development of treatment plan with patient and/or surrogate as well as nursing, discussions with consultants, evaluation of patient's response to treatment, examination of patient, obtaining history from patient or surrogate, ordering and performing treatments and interventions, ordering and review of laboratory studies, ordering and review of radiographic studies, pulse oximetry and re-evaluation of patient's condition.  DIFFERENTIAL DIAGNOSIS   Anxiety, depression, pneumonia, pneumothorax, PE, unstable angina, GERD  FINAL ASSESSMENT AND PLAN  Bilateral pulmonary emboli, chest pain, shortness of breath   Plan: Patient had presented for chest pain shortness of breath. Patient's labs were reassuring other than a mildly elevated BUN. Patient's imaging did reveal bilateral PEs with no obvious findings of right heart strain.  It is unclear why she has these clots.  She will be placed on anticoagulation and we will bring her in the hospital for further evaluation.   Earleen Newport, MD   Note: This note was generated in part or whole with voice recognition software.  Voice recognition is usually quite accurate but there are transcription errors that can and very often do occur. I apologize for any typographical errors that were not detected and corrected.     Earleen Newport, MD 08/15/17 [phone]

## 2017-08-15 NOTE — Progress Notes (Signed)
Report received from [facility].

## 2017-08-15 NOTE — Progress Notes (Signed)
ANTICOAGULATION CONSULT NOTE - Initial Consult  Pharmacy Consult for apixaban Indication: pulmonary embolus  Allergies  Allergen Reactions  . Codeine Itching    Has not tried benadryl along with medication  . Sulfa Antibiotics Nausea And Vomiting  . Tramadol Hcl Itching    Patient Measurements: Height: 5\' 4"  (162.6 cm) Weight: 124 lb (56.2 kg) IBW/kg (Calculated) : 54.7  Vital Signs: Temp: 98 F (36.7 C) (12/08 1345) Temp Source: Oral (12/08 1345) BP: 138/90 (12/08 1700) Pulse Rate: 62 (12/08 1700)  Labs: Recent Labs    08/15/17 1344 08/15/17 1514  HGB 14.4  --   HCT 42.9  --   PLT 207  --   CREATININE 1.00  --   TROPONINI <0.03 <0.03    Estimated Creatinine Clearance: 32.9 mL/min (by C-G formula based on SCr of 1 mg/dL).   Medical History: Past Medical History:  Diagnosis Date  . C. difficile diarrhea   . Chronic kidney disease   . Constipation   . GERD (gastroesophageal reflux disease)   . Hypercholesteremia   . Hypertension   . Shortness of breath     Medications:   (Not in a hospital admission) Scheduled:  . [START ON 08/16/2017] apixaban  10 mg Oral BID   Followed by  . [START ON 08/23/2017] apixaban  5 mg Oral BID  . docusate sodium  100 mg Oral BID   Infusions:   PRN: acetaminophen **OR** acetaminophen, bisacodyl, ondansetron **OR** ondansetron (ZOFRAN) IV, traZODone Anti-infectives (From admission, onward)   None      Assessment: 83 year old female requiring anticoagulation with apixaban for PE Goal of Therapy:  anticoagulation on apixaban  Monitor platelets by anticoagulation protocol: Yes   Plan:  apixaban 10mg  po bid x 7 days followed by apixaban 5mg  po bid therafter, MD to determine length of treatment     Donna Christen Jhonathan Desroches 08/15/2017,5:48 PM

## 2017-08-15 NOTE — ED Notes (Signed)
Patient in [place] at this time. Will transfer patient to inpatient room once patient is done in [place].

## 2017-08-15 NOTE — ED Notes (Signed)
Attempted to call report x 1  

## 2017-08-15 NOTE — H&P (Signed)
[facility] [place] NAME: Susan Banks    MR#:  [id number]  DATE OF BIRTH:  [birth year]  DATE OF ADMISSION:  08/15/2017  PRIMARY CARE PHYSICIAN: Kelton Pillar, MD   REQUESTING/REFERRING PHYSICIAN: Dr. Lenise Arena  CHIEF COMPLAINT: Shortness of breath   Chief Complaint  Patient presents with  . Shortness of Breath    HISTORY OF PRESENT ILLNESS:  Susan Banks  is a 83 y.o. female with a known history of kidney disease stage III, GERD, essential hypertension, hyperlipidemia came in because of worsening shortness of breath since this morning associated with left axilla pain.  Patient has been having right leg pain, went to PCP and she was told she has spinal stenosis that is causing leg pain and she was given steroid injection in the back 2 weeks ago with improvement of the right leg pain.  Patient did not have any ultrasound of the legs for DVT evaluation, today CT angios chest showed bilateral PE.  Patient has significant worsening of shortness of breath associated with left shoulder and axilla pain today morning.  Patient has been having sedentary lifestyle for almost for the past few months secondary to leg pain, back pain.  She lives alone and her daughter lives next door.  PAST MEDICAL HISTORY:   Past Medical History:  Diagnosis Date  . C. difficile diarrhea   . Chronic kidney disease   . Constipation   . GERD (gastroesophageal reflux disease)   . Hypercholesteremia   . Hypertension   . Shortness of breath     PAST SURGICAL HISTOIRY:   Past Surgical History:  Procedure Laterality Date  . ABDOMINAL SURGERY    . ESOPHAGOGASTRODUODENOSCOPY N/A 07/04/2014   Procedure: ESOPHAGOGASTRODUODENOSCOPY (EGD);  Surgeon: Laverda Page, MD;  Location: [facility];  Service: Cardiovascular;  Laterality: N/A;  . TEE WITHOUT CARDIOVERSION N/A 06/13/2014   Procedure: TRANSESOPHAGEAL ECHOCARDIOGRAM (TEE);  Surgeon: Laverda Page, MD;  Location: [facility];  Service: Cardiovascular;  Laterality: N/A;    SOCIAL HISTORY:   Social History   Tobacco Use  . Smoking status: Former Smoker    Last attempt to quit: 06/14/1983    Years since quitting: 34.1  . Smokeless tobacco: Never Used  Substance Use Topics  . Alcohol use: No    FAMILY HISTORY:  No family history on file.  DRUG ALLERGIES:   Allergies  Allergen Reactions  . Codeine Itching    Has not tried benadryl along with medication  . Sulfa Antibiotics Nausea And Vomiting  . Tramadol Hcl Itching    REVIEW OF SYSTEMS:  CONSTITUTIONAL: No fever, patient does have some fatigue today. [name]  EYES: No blurred or double vision.  EARS, NOSE, AND THROAT: No tinnitus or ear pain.  RESPIRATORY: Worsening shortness of breath today.  CARDIOVASCULAR: No chest pain, orthopnea, edema.  GASTROINTESTINAL: No nausea, vomiting, diarrhea or abdominal pain.  GENITOURINARY: No dysuria, hematuria.  ENDOCRINE: No polyuria, nocturia,  HEMATOLOGY: No anemia, easy bruising or bleeding SKIN: No rash or lesion. MUSCULOSKELETAL: Arthritis,, back pain  NEUROLOGIC: No tingling, numbness, weakness.  PSYCHIATRY: No anxiety or depression.   MEDICATIONS AT HOME:   Prior to Admission medications   Medication Sig Start Date End Date Taking? Authorizing Provider  amLODipine (NORVASC) 5 MG tablet Take 1 tablet (5 mg total) by mouth daily. Patient taking differently: Take 2.5 mg by mouth daily.  06/16/15  Yes Domenic Polite, MD  cholecalciferol (VITAMIN D) 1000 UNITS tablet Take  1,000 Units by mouth daily.   Yes [provider]  naproxen sodium (ALEVE) 220 MG tablet Take 220-440 mg by mouth 2 (two) times daily as needed.   Yes [provider]  omeprazole (PRILOSEC) 20 MG capsule Take 20 mg by mouth daily.   Yes [provider]  saccharomyces boulardii (FLORASTOR) 250 MG capsule Take 1 capsule (250 mg total) by mouth 2 (two) times daily. 05/22/15  Yes  Short, [name], MD  ondansetron (ZOFRAN ODT) 4 MG disintegrating tablet Take 1 tablet (4 mg total) by mouth every 8 (eight) hours as needed for nausea or vomiting. Patient not taking: Reported on 08/15/2017 11/17/15   Gareth Morgan, MD      VITAL SIGNS:  Blood pressure (!) 174/77, pulse 91, temperature 98 F (36.7 C), temperature source Oral, resp. rate 16, height 5\' 4"  (1.626 m), weight 56.2 kg (124 lb), SpO2 100 %.  PHYSICAL EXAMINATION:  GENERAL:  83 y.o.-year-old patient lying in the bed with no acute distress.  EYES: Pupils equal, round, reactive to light  No scleral icterus. Extraocular muscles intact.  HEENT: Head atraumatic, normocephalic. Oropharynx and nasopharynx clear.  NECK:  Supple, no jugular venous distention. No thyroid enlargement, no tenderness.  LUNGS: Normal breath sounds bilaterally, no wheezing, rales,rhonchi or crepitation. No use of accessory muscles of respiration.  CARDIOVASCULAR: S1, S2 normal. No murmurs, rubs, or gallops.  ABDOMEN: Soft, nontender, nondistended. Bowel sounds present. No organomegaly or mass.  EXTREMITIES: No pedal edema, cyanosis, or clubbing.  NEUROLOGIC: Cranial nerves II through XII are intact. Muscle strength 5/5 in all extremities. Sensation intact. Gait not checked.  PSYCHIATRIC: The patient is alert and oriented x 3.  SKIN: No obvious rash, lesion, or ulcer.   LABORATORY PANEL:   CBC Recent Labs  Lab 08/15/17 1344  WBC 12.7*  HGB 14.4  HCT 42.9  PLT 207   ------------------------------------------------------------------------------------------------------------------  Chemistries  Recent Labs  Lab 08/15/17 1344  NA 139  K 3.9  CL 106  CO2 23  GLUCOSE 116*  BUN 26*  CREATININE 1.00  CALCIUM 10.3   ------------------------------------------------------------------------------------------------------------------  Cardiac Enzymes Recent Labs  Lab 08/15/17 1514  TROPONINI <0.03    ------------------------------------------------------------------------------------------------------------------  RADIOLOGY:  Dg Chest 2 View  Result Date: 08/15/2017 CLINICAL DATA:  Patient reports SOB and left shoulder pain onset this morning. Reports there is a "lump" under her left shoulder. Denies CP. Hx HTN. Former smoker. EXAM: CHEST - 2 VIEW COMPARISON:  06/13/2015 FINDINGS: Coarse attenuated perihilar and bibasilar bronchovascular markings. No confluent airspace disease. Heart size and mediastinal contours are within normal limits. Retrocardiac double density with fluid level consistent with moderate hiatal hernia. No pneumothorax. No effusion. Visualized bones unremarkable. IMPRESSION: 1. No acute disease. 2. Hiatal hernia. Electronically Signed   By: Lucrezia Europe M.D.   On: 08/15/2017 14:37   Ct Angio Chest Pe W And/or Wo Contrast  Result Date: 08/15/2017 CLINICAL DATA:  Left-sided chest pain and shortness of breath increasing today. EXAM: CT ANGIOGRAPHY CHEST WITH CONTRAST TECHNIQUE: Multidetector CT imaging of the chest was performed using the standard protocol during bolus administration of intravenous contrast. Multiplanar CT image reconstructions and MIPs were obtained to evaluate the vascular anatomy. CONTRAST:  37mL ISOVUE-370 IOPAMIDOL (ISOVUE-370) INJECTION 76% COMPARISON:  Current chest radiograph. FINDINGS: Cardiovascular: There is satisfactory opacification of the pulmonary arteries to the segmental level. There are bilateral nonocclusive pulmonary emboli. On the right, there is a pulmonary embolus in the interlobar pulmonary artery at the branch point between the middle  and lower lobe pulmonary arteries. Segmental pulmonary emboli are noted in the right middle and lower lobes. There also segmental pulmonary artery emboli to segmental branches to the right upper lobe. On the left, there is a pulmonary embolus in the lingular segmental branch no other definitive left sided  pulmonary emboli. The heart is normal in size and configuration. There are moderate three-vessel coronary artery calcifications. The thoracic aorta is normal in caliber. No dissection. Minor atherosclerotic disease noted along the arch. RV/LV ratio equals 0.88.  No findings of right heart strain. Mediastinum/Nodes: No neck base, axillary, mediastinal or hilar masses or enlarged lymph nodes. Trachea is widely patent. There is a moderate to large sized hiatal hernia. Lungs/Pleura: No evidence of pneumonia or pulmonary edema. Dependent subsegmental atelectasis in the lower lobes and adjacent to the hiatal hernia in the left lower lobe. Minor areas of reticular opacity is noted in the upper lobes consistent with interstitial scarring. No lung mass or nodule. No pleural effusion or pneumothorax. Upper Abdomen: No acute abnormality. Musculoskeletal: No fracture or acute finding. No osteoblastic or osteolytic lesions. Review of the MIP images confirms the above findings. IMPRESSION: 1. Bilateral nonocclusive pulmonary emboli, more numerous on the right, as detailed above. No evidence of pulmonary infarction. No findings of right heart strain. 2. No other acute abnormalities in the chest. No evidence of pneumonia or pulmonary edema. 3. Coronary artery calcifications.  Minor aortic atherosclerosis. 4. Moderate to large hiatal hernia. Aortic Atherosclerosis (ICD10-I70.0). Electronically Signed   By: Lajean Manes M.D.   On: 08/15/2017 16:07    EKG:   Orders placed or performed during the hospital encounter of 08/15/17  . ED EKG within 10 minutes  . ED EKG within 10 minutes   EKG shows sinus rhythm with PVCs and 88 bpm, no ST-T changes.  IMPRESSION AND PLAN:   83 year old female patient with worsening shortness of breath and found to have bilateral pulmonary embolus.  Patient is not hypoxic here.  Likely culprit may be leg DVT.  Admit to hospital, start Eliquis, check ultrasound of the legs.  Discussed risks and  benefits of anticoagulation with the patient and also patient's daughter.  #2 essential hypertension: This patient is on Norvasc.  Continue Norvasc, continue low-sodium diet. 3.  History of C. difficile colitis, patient has no diarrhea now she is on Florastor. #4 history of arthritis, she has CKD stage III but she is on Aleve 220 mg twice daily as needed at home.  5.  History of GERD: Continue PPIs.  discussed the plan with patient's daughter.  All the records are reviewed and case discussed with ED provider. Management plans discussed with the patient, family and they are in agreement.  CODE STATUS: full  TOTAL TIME TAKING CARE OF THIS PATIENT: 94minutes.    Epifanio Lesches M.D on 08/15/2017 at 5:08 PM  Between 7am to 6pm - Pager - [phone]  After 6pm go to [email or web address] - password [email or web address] [facility]  Office  [phone]  CC: Primary care physician; Kelton Pillar, MD  Note: This dictation was prepared with Dragon dictation along with smaller phrase technology. Any transcriptional errors that result from this process are unintentional.

## 2017-08-16 DIAGNOSIS — I2699 Other pulmonary embolism without acute cor pulmonale: Secondary | ICD-10-CM | POA: Diagnosis not present

## 2017-08-16 DIAGNOSIS — M79606 Pain in leg, unspecified: Secondary | ICD-10-CM | POA: Diagnosis not present

## 2017-08-16 LAB — CBC
HEMATOCRIT: 38.7 % (ref 35.0–47.0)
HEMOGLOBIN: 13.2 g/dL (ref 12.0–16.0)
MCH: 30.6 pg (ref 26.0–34.0)
MCHC: 34 g/dL (ref 32.0–36.0)
MCV: 90.1 fL (ref 80.0–100.0)
Platelets: 176 10*3/uL (ref 150–440)
RBC: 4.3 MIL/uL (ref 3.80–5.20)
RDW: 14.3 % (ref 11.5–14.5)
WBC: 10.9 10*3/uL (ref 3.6–11.0)

## 2017-08-16 LAB — BASIC METABOLIC PANEL
ANION GAP: 6 (ref 5–15)
BUN: 25 mg/dL — ABNORMAL HIGH (ref 6–20)
CO2: 22 mmol/L (ref 22–32)
Calcium: 9.5 mg/dL (ref 8.9–10.3)
Chloride: 110 mmol/L (ref 101–111)
Creatinine, Ser: 0.81 mg/dL (ref 0.44–1.00)
Glucose, Bld: 105 mg/dL — ABNORMAL HIGH (ref 65–99)
POTASSIUM: 4.1 mmol/L (ref 3.5–5.1)
SODIUM: 138 mmol/L (ref 135–145)

## 2017-08-16 LAB — GLUCOSE, CAPILLARY: GLUCOSE-CAPILLARY: 91 mg/dL (ref 65–99)

## 2017-08-16 MED ORDER — SODIUM CHLORIDE 0.9% FLUSH
3.0000 mL | Freq: Two times a day (BID) | INTRAVENOUS | Status: DC
  Administered 2017-08-16 (×2): 3 mL via INTRAVENOUS

## 2017-08-16 MED ORDER — APIXABAN 5 MG PO TABS: ORAL_TABLET | ORAL | 0 refills | Status: AC

## 2017-08-16 MED ORDER — AMLODIPINE BESYLATE 5 MG PO TABS: 2.5000 mg | ORAL_TABLET | Freq: Every day | ORAL | Status: DC

## 2017-08-16 MED ORDER — APIXABAN 5 MG PO TABS: ORAL_TABLET | ORAL | 0 refills | Status: DC

## 2017-08-16 NOTE — Discharge Summary (Signed)
[facility] NAME: Susan Banks    MR#:  [id number]  DATE OF BIRTH:  04-26-1926  DATE OF ADMISSION:  08/15/2017 ADMITTING PHYSICIAN: Epifanio Lesches, MD  DATE OF DISCHARGE: 08/16/2017 12:12 PM  PRIMARY CARE PHYSICIAN: Kelton Pillar, MD    ADMISSION DIAGNOSIS:  Leg pain [M79.606] DOE (dyspnea on exertion) [R06.09] Pulmonary embolism, bilateral ([place]) [I26.99]  DISCHARGE DIAGNOSIS:  Active Problems:   Pulmonary embolus ([place])   SECONDARY DIAGNOSIS:   Past Medical History:  Diagnosis Date  . C. difficile diarrhea   . Chronic kidney disease   . Constipation   . GERD (gastroesophageal reflux disease)   . Hypercholesteremia   . Hypertension   . Shortness of breath     HOSPITAL COURSE:   1.  Pulmonary embolism and DVT left leg.  Patient was started on Eliquis.  She was feeling okay.  No chest pain or shortness of breath.  Patient will be discharged home on Eliquis.  1 month supply ordered.  Explained to patient and daughter that she will need to be on blood thinner for 6 months.  Recheck sonogram of the leg and/or CT scan of the chest at that time.  Risk of blood thinner explained to the patient. 2.  Essential hypertension on Norvasc 3.  History of C. difficile colitis in the past on Florastor 4.  Left shoulder pain relieved with Tylenol.  This can be given at home. 5.  History of GERD.  On omeprazole. 6.  We had social work team see the patient about her home situation.  Case discussed with daughter at the bedside.  The daughter will bring the patient home at this point.  Please refer to social work note.  DISCHARGE CONDITIONS:   Satisfactory  CONSULTS OBTAINED:  Social work  DRUG ALLERGIES:   Allergies  Allergen Reactions  . Codeine Itching    Has not tried benadryl along with medication  . Sulfa Antibiotics Nausea And Vomiting  . Tramadol Hcl Itching    DISCHARGE MEDICATIONS:   Allergies as of 08/16/2017       Reactions   Codeine Itching   Has not tried benadryl along with medication   Sulfa Antibiotics Nausea And Vomiting   Tramadol Hcl Itching      Medication List    STOP taking these medications   naproxen sodium 220 MG tablet Commonly known as:  ALEVE   ondansetron 4 MG disintegrating tablet Commonly known as:  ZOFRAN ODT     TAKE these medications   amLODipine 5 MG tablet Commonly known as:  NORVASC Take 0.5 tablets (2.5 mg total) by mouth daily. Notes to patient:  Resume as normal   apixaban 5 MG Tabs tablet Commonly known as:  ELIQUIS 2 tablets orally twice a day for six days then one tablet twice a day afterwards   cholecalciferol 1000 units tablet Commonly known as:  VITAMIN D Take 1,000 Units by mouth daily. Notes to patient:  Resume as normal   omeprazole 20 MG capsule Commonly known as:  PRILOSEC Take 20 mg by mouth daily. Notes to patient:  Resume as normal   saccharomyces boulardii 250 MG capsule Commonly known as:  FLORASTOR Take 1 capsule (250 mg total) by mouth 2 (two) times daily. Notes to patient:  Resume as normal        DISCHARGE INSTRUCTIONS:   Follow-up PMD 1 week  If you experience worsening of your admission symptoms, develop shortness of breath, life  threatening emergency, suicidal or homicidal thoughts you must seek medical attention immediately by calling 911 or calling your MD immediately  if symptoms less severe.  You Must read complete instructions/literature along with all the possible adverse reactions/side effects for all the Medicines you take and that have been prescribed to you. Take any new Medicines after you have completely understood and accept all the possible adverse reactions/side effects.   Please note  You were cared for by a hospitalist during your hospital stay. If you have any questions about your discharge medications or the care you received while you were in the hospital after you are discharged, you can call  the unit and asked to speak with the hospitalist on call if the hospitalist that took care of you is not available. Once you are discharged, your primary care physician will handle any further medical issues. Please note that NO REFILLS for any discharge medications will be authorized once you are discharged, as it is imperative that you return to your primary care physician (or establish a relationship with a primary care physician if you do not have one) for your aftercare needs so that they can reassess your need for medications and monitor your lab values.    Today   CHIEF COMPLAINT:   Chief Complaint  Patient presents with  . Shortness of Breath    HISTORY OF PRESENT ILLNESS:  Susan Banks  is a 83 y.o. female with a known history of came in with shortness of breath and left shoulder pain   VITAL SIGNS:  Blood pressure (!) 151/67, pulse 73, temperature (!) 97.5 F (36.4 C), temperature source Oral, resp. rate 16, height 5\' 4"  (1.626 m), weight 54.6 kg (120 lb 6.4 oz), SpO2 99 %.    PHYSICAL EXAMINATION:  GENERAL:  83 y.o.-year-old patient lying in the bed with no acute distress.  EYES: Pupils equal, round, reactive to light and accommodation. No scleral icterus. Extraocular muscles intact.  HEENT: Head atraumatic, normocephalic. Oropharynx and nasopharynx clear.  NECK:  Supple, no jugular venous distention. No thyroid enlargement, no tenderness.  LUNGS: Normal breath sounds bilaterally, no wheezing, rales,rhonchi or crepitation. No use of accessory muscles of respiration.  CARDIOVASCULAR: S1, S2 normal. No murmurs, rubs, or gallops.  ABDOMEN: Soft, non-tender, non-distended. Bowel sounds present. No organomegaly or mass.  EXTREMITIES: No pedal edema, cyanosis, or clubbing.  NEUROLOGIC: Cranial nerves II through XII are intact. Muscle strength 5/5 in all extremities. Sensation intact. Gait not checked.  PSYCHIATRIC: The patient is alert and oriented x 3.  SKIN: No obvious rash,  lesion, or ulcer.   DATA REVIEW:   CBC Recent Labs  Lab 08/16/17 0526  WBC 10.9  HGB 13.2  HCT 38.7  PLT 176    Chemistries  Recent Labs  Lab 08/16/17 0526  NA 138  K 4.1  CL 110  CO2 22  GLUCOSE 105*  BUN 25*  CREATININE 0.81  CALCIUM 9.5    Cardiac Enzymes Recent Labs  Lab 08/15/17 1514  TROPONINI <0.03      RADIOLOGY:  Dg Chest 2 View  Result Date: 08/15/2017 CLINICAL DATA:  Patient reports SOB and left shoulder pain onset this morning. Reports there is a "lump" under her left shoulder. Denies CP. Hx HTN. Former smoker. EXAM: CHEST - 2 VIEW COMPARISON:  06/13/2015 FINDINGS: Coarse attenuated perihilar and bibasilar bronchovascular markings. No confluent airspace disease. Heart size and mediastinal contours are within normal limits. Retrocardiac double density with fluid level consistent with moderate hiatal  hernia. No pneumothorax. No effusion. Visualized bones unremarkable. IMPRESSION: 1. No acute disease. 2. Hiatal hernia. Electronically Signed   By: Lucrezia Europe M.D.   On: 08/15/2017 14:37   Ct Angio Chest Pe W And/or Wo Contrast  Result Date: 08/15/2017 CLINICAL DATA:  Left-sided chest pain and shortness of breath increasing today. EXAM: CT ANGIOGRAPHY CHEST WITH CONTRAST TECHNIQUE: Multidetector CT imaging of the chest was performed using the standard protocol during bolus administration of intravenous contrast. Multiplanar CT image reconstructions and MIPs were obtained to evaluate the vascular anatomy. CONTRAST:  76mL ISOVUE-370 IOPAMIDOL (ISOVUE-370) INJECTION 76% COMPARISON:  Current chest radiograph. FINDINGS: Cardiovascular: There is satisfactory opacification of the pulmonary arteries to the segmental level. There are bilateral nonocclusive pulmonary emboli. On the right, there is a pulmonary embolus in the interlobar pulmonary artery at the branch point between the middle and lower lobe pulmonary arteries. Segmental pulmonary emboli are noted in the right  middle and lower lobes. There also segmental pulmonary artery emboli to segmental branches to the right upper lobe. On the left, there is a pulmonary embolus in the lingular segmental branch no other definitive left sided pulmonary emboli. The heart is normal in size and configuration. There are moderate three-vessel coronary artery calcifications. The thoracic aorta is normal in caliber. No dissection. Minor atherosclerotic disease noted along the arch. RV/LV ratio equals 0.88.  No findings of right heart strain. Mediastinum/Nodes: No neck base, axillary, mediastinal or hilar masses or enlarged lymph nodes. Trachea is widely patent. There is a moderate to large sized hiatal hernia. Lungs/Pleura: No evidence of pneumonia or pulmonary edema. Dependent subsegmental atelectasis in the lower lobes and adjacent to the hiatal hernia in the left lower lobe. Minor areas of reticular opacity is noted in the upper lobes consistent with interstitial scarring. No lung mass or nodule. No pleural effusion or pneumothorax. Upper Abdomen: No acute abnormality. Musculoskeletal: No fracture or acute finding. No osteoblastic or osteolytic lesions. Review of the MIP images confirms the above findings. IMPRESSION: 1. Bilateral nonocclusive pulmonary emboli, more numerous on the right, as detailed above. No evidence of pulmonary infarction. No findings of right heart strain. 2. No other acute abnormalities in the chest. No evidence of pneumonia or pulmonary edema. 3. Coronary artery calcifications.  Minor aortic atherosclerosis. 4. Moderate to large hiatal hernia. Aortic Atherosclerosis (ICD10-I70.0). Electronically Signed   By: Lajean Manes M.D.   On: 08/15/2017 16:07   US Venous Img Lower Bilateral  Result Date: 08/15/2017 CLINICAL DATA:  RIGHT leg pain, history of prior pulmonary embolism EXAM: BILATERAL LOWER EXTREMITY VENOUS DOPPLER ULTRASOUND TECHNIQUE: Gray-scale sonography with graded compression, as well as color Doppler  and duplex ultrasound were performed to evaluate the lower extremity deep venous systems from the level of the common femoral vein and including the common femoral, femoral, profunda femoral, popliteal and calf veins including the posterior tibial, peroneal and gastrocnemius veins when visible. The superficial great saphenous vein was also interrogated. Spectral Doppler was utilized to evaluate flow at rest and with distal augmentation maneuvers in the common femoral, femoral and popliteal veins. COMPARISON:  None. FINDINGS: RIGHT LOWER EXTREMITY Common Femoral Vein: No evidence of thrombus. Normal compressibility, respiratory phasicity and response to augmentation. Saphenofemoral Junction: No evidence of thrombus. Normal compressibility and flow on color Doppler imaging. Profunda Femoral Vein: No evidence of thrombus. Normal compressibility and flow on color Doppler imaging. Femoral Vein: No evidence of thrombus. Normal compressibility, respiratory phasicity and response to augmentation. Popliteal Vein: No evidence of thrombus. Normal  compressibility, respiratory phasicity and response to augmentation. Calf Veins: No evidence of thrombus. Normal compressibility and flow on color Doppler imaging. Superficial Great Saphenous Vein: No evidence of thrombus. Normal compressibility. Venous Reflux:  None. Other Findings:  None. LEFT LOWER EXTREMITY Common Femoral Vein: No evidence of thrombus. Normal compressibility, respiratory phasicity and response to augmentation. Saphenofemoral Junction: No evidence of thrombus. Normal compressibility and flow on color Doppler imaging. Profunda Femoral Vein: No evidence of thrombus. Normal compressibility and flow on color Doppler imaging. Femoral Vein: No evidence of thrombus. Normal compressibility, respiratory phasicity and response to augmentation. Popliteal Vein: No evidence of thrombus. Normal compressibility, respiratory phasicity and response to augmentation. Calf Veins:  Hypoechoic occlusive thrombus distends LEFT calf veins. Absent spontaneous venous flow and impaired compressibility. Superficial Great Saphenous Vein: No evidence of thrombus. Normal compressibility. Venous Reflux:  None. Other Findings:  None. IMPRESSION: Acute deep venous thrombosis involving the LEFT calf veins. No evidence of deep venous thrombosis in the RIGHT lower extremity. Electronically Signed   By: Lavonia Dana M.D.   On: 08/15/2017 18:13    Management plans discussed with the patient, family and they are in agreement.  CODE STATUS:     Code Status Orders  (From admission, onward)        Start     Ordered   08/15/17 1705  Full code  Continuous     08/15/17 1706    Code Status History    Date Active Date Inactive Code Status Order ID Comments User Context   06/14/2015 00:26 06/16/2015 11:57 Full Code [id number]  Reubin Milan, MD ED   05/20/2015 01:15 05/22/2015 14:08 Full Code [id number]  Etta Quill, DO ED      TOTAL TIME TAKING CARE OF THIS PATIENT: 40 minutes.    Loletha Grayer M.D on 08/16/2017 at 1:28 PM  Between 7am to 6pm - Pager - [phone]  After 6pm go to [email or web address] - password [email or web address] [facility]  [phone]  CC: Primary care physician; Kelton Pillar, MD
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
## 2017-09-23 ENCOUNTER — Emergency Department
Admission: EM | Admit: 2017-09-23 | Discharge: 2017-09-23 | Disposition: A | Discharge status: Home / Self Care | Payer: Medicare Other | Attending: Emergency Medicine | Admitting: Emergency Medicine

## 2017-09-23 ENCOUNTER — Emergency Department: Payer: Medicare Other

## 2017-09-23 ENCOUNTER — Encounter: Payer: Self-pay | Admitting: Emergency Medicine

## 2017-09-23 ENCOUNTER — Other Ambulatory Visit: Payer: Self-pay

## 2017-09-23 DIAGNOSIS — E860 Dehydration: Secondary | ICD-10-CM | POA: Insufficient documentation

## 2017-09-23 DIAGNOSIS — W101XXA Fall (on)(from) sidewalk curb, initial encounter: Secondary | ICD-10-CM | POA: Diagnosis not present

## 2017-09-23 DIAGNOSIS — S60512A Abrasion of left hand, initial encounter: Secondary | ICD-10-CM | POA: Insufficient documentation

## 2017-09-23 DIAGNOSIS — S0990XA Unspecified injury of head, initial encounter: Secondary | ICD-10-CM | POA: Diagnosis not present

## 2017-09-23 DIAGNOSIS — W19XXXA Unspecified fall, initial encounter: Secondary | ICD-10-CM

## 2017-09-23 DIAGNOSIS — Y9301 Activity, walking, marching and hiking: Secondary | ICD-10-CM | POA: Diagnosis not present

## 2017-09-23 DIAGNOSIS — W010XXA Fall on same level from slipping, tripping and stumbling without subsequent striking against object, initial encounter: Secondary | ICD-10-CM | POA: Insufficient documentation

## 2017-09-23 DIAGNOSIS — Y999 Unspecified external cause status: Secondary | ICD-10-CM | POA: Insufficient documentation

## 2017-09-23 DIAGNOSIS — N3000 Acute cystitis without hematuria: Secondary | ICD-10-CM | POA: Diagnosis not present

## 2017-09-23 DIAGNOSIS — R300 Dysuria: Secondary | ICD-10-CM | POA: Diagnosis present

## 2017-09-23 DIAGNOSIS — Y929 Unspecified place or not applicable: Secondary | ICD-10-CM | POA: Diagnosis not present

## 2017-09-23 DIAGNOSIS — S61419A Laceration without foreign body of unspecified hand, initial encounter: Secondary | ICD-10-CM

## 2017-09-23 LAB — URINALYSIS, ROUTINE W REFLEX MICROSCOPIC
BACTERIA UA: NONE SEEN
BILIRUBIN URINE: NEGATIVE
GLUCOSE, UA: NEGATIVE mg/dL
Ketones, ur: 20 mg/dL — AB
NITRITE: POSITIVE — AB
PROTEIN: 30 mg/dL — AB
SPECIFIC GRAVITY, URINE: 1.021 (ref 1.005–1.030)
pH: 6 (ref 5.0–8.0)

## 2017-09-23 LAB — BASIC METABOLIC PANEL
Anion gap: 12 (ref 5–15)
BUN: 16 mg/dL (ref 6–20)
CO2: 21 mmol/L — AB (ref 22–32)
CREATININE: 1.03 mg/dL — AB (ref 0.44–1.00)
Calcium: 10.3 mg/dL (ref 8.9–10.3)
Chloride: 105 mmol/L (ref 101–111)
GFR calc Af Amer: 54 mL/min — ABNORMAL LOW (ref >60)
GFR, EST NON AFRICAN AMERICAN: 46 mL/min — AB (ref >60)
GLUCOSE: 104 mg/dL — AB (ref 65–99)
Potassium: 4.6 mmol/L (ref 3.5–5.1)
Sodium: 138 mmol/L (ref 135–145)

## 2017-09-23 LAB — CBC
HEMATOCRIT: 42.6 % (ref 35.0–47.0)
Hemoglobin: 14 g/dL (ref 12.0–16.0)
MCH: 29.5 pg (ref 26.0–34.0)
MCHC: 32.9 g/dL (ref 32.0–36.0)
MCV: 89.7 fL (ref 80.0–100.0)
PLATELETS: 290 10*3/uL (ref 150–440)
RBC: 4.75 MIL/uL (ref 3.80–5.20)
RDW: 14.3 % (ref 11.5–14.5)
WBC: 12 10*3/uL — AB (ref 3.6–11.0)

## 2017-09-23 MED ORDER — CEPHALEXIN 500 MG PO CAPS
500.0000 mg | ORAL_CAPSULE | Freq: Once | ORAL | Status: AC
  Administered 2017-09-23: 500 mg via ORAL
  Filled 2017-09-23: qty 1

## 2017-09-23 MED ORDER — CEPHALEXIN 250 MG PO CAPS: 250.0000 mg | ORAL_CAPSULE | Freq: Three times a day (TID) | ORAL | 0 refills | Status: AC

## 2017-09-23 MED ORDER — TETANUS-DIPHTH-ACELL PERTUSSIS 5-2.5-18.5 LF-MCG/0.5 IM SUSP
0.5000 mL | Freq: Once | INTRAMUSCULAR | Status: AC
  Administered 2017-09-23: 0.5 mL via INTRAMUSCULAR
  Filled 2017-09-23: qty 0.5

## 2017-09-23 MED ORDER — HYDROCODONE-ACETAMINOPHEN 5-325 MG PO TABS
1.0000 | ORAL_TABLET | Freq: Once | ORAL | Status: AC
  Administered 2017-09-23: 1 via ORAL
  Filled 2017-09-23: qty 1

## 2017-09-23 MED ORDER — SODIUM CHLORIDE 0.9 % IV BOLUS (SEPSIS)
500.0000 mL | Freq: Once | INTRAVENOUS | Status: AC
  Administered 2017-09-23: 500 mL via INTRAVENOUS

## 2017-09-23 NOTE — ED Provider Notes (Addendum)
[facility] Emergency Department Provider Note  ____________________________________________   First MD Initiated Contact with Patient 09/23/17 1828     (approximate)  I have reviewed the triage vital signs and the nursing notes.   HISTORY  Chief Complaint Fall   HPI Susan Banks is a 84 y.o. female presents for evaluation of pain with urination.  History of chronic kidney disease, hypertension.  Patient reported to her family last night that she began experiencing burning with urination.  Patient reports no other symptoms other than noticing in her urine as well as urinating since last night.  No fevers or chills no nausea or vomiting.  No chest pain.  No headaches.  She has been eating normally, but for her this is very small amount.  Her family and daughter husband reports that she is chronically getting dehydrated.  Today she was walking into the clinic to be evaluated for burning with urination and a "UTI" when she tripped on the sidewalk fell forward.  She did strike her head but denies headache or neck pain.  She did not lose consciousness.  She did sustain a small cut to the skin of her left fourth digit on the palm.  Denies pain with movement, no deformity.  Has not had a tetanus shot recently  Past Medical History:  Diagnosis Date  . C. difficile diarrhea   . Chronic kidney disease   . Constipation   . GERD (gastroesophageal reflux disease)   . Hypercholesteremia   . Hypertension   . Shortness of breath     Patient Active Problem List   Diagnosis Date Noted  . Pulmonary embolus ([place]) 08/15/2017  . Hyperlipidemia 06/14/2015  . Fever 06/14/2015  . Altered mental status 06/13/2015  . Hyponatremia 05/22/2015  . Normocytic anemia 05/22/2015  . GERD (gastroesophageal reflux disease) 05/22/2015  . Diarrhea 05/20/2015  . HTN (hypertension) 05/20/2015  . Chronic kidney disease, stage 3 ([place]) 05/20/2015  . C. difficile colitis 05/20/2015  .  Proctocolitis     Past Surgical History:  Procedure Laterality Date  . ABDOMINAL SURGERY    . ESOPHAGOGASTRODUODENOSCOPY N/A 07/04/2014   Procedure: ESOPHAGOGASTRODUODENOSCOPY (EGD);  Surgeon: Laverda Page, MD;  Location: [place];  Service: Cardiovascular;  Laterality: N/A;  . TEE WITHOUT CARDIOVERSION N/A 06/13/2014   Procedure: TRANSESOPHAGEAL ECHOCARDIOGRAM (TEE);  Surgeon: Laverda Page, MD;  Location: [place];  Service: Cardiovascular;  Laterality: N/A;    Prior to Admission medications   Medication Sig Start Date End Date Taking? Authorizing Provider  amLODipine (NORVASC) 5 MG tablet Take 0.5 tablets (2.5 mg total) by mouth daily. 08/16/17   Loletha Grayer, MD  apixaban (ELIQUIS) 5 MG TABS tablet 2 tablets orally twice a day for six days then one tablet twice a day afterwards 08/16/17   Loletha Grayer, MD  cephALEXin (KEFLEX) 250 MG capsule Take 1 capsule (250 mg total) by mouth 3 (three) times daily for 7 days. 09/23/17 09/30/17  Delman Kitten, MD  cholecalciferol (VITAMIN D) 1000 UNITS tablet Take 1,000 Units by mouth daily.    [provider]  omeprazole (PRILOSEC) 20 MG capsule Take 20 mg by mouth daily.    [provider]  saccharomyces boulardii (FLORASTOR) 250 MG capsule Take 1 capsule (250 mg total) by mouth 2 (two) times daily. 05/22/15   Janece Canterbury, MD    Allergies Codeine; Sulfa antibiotics; and Tramadol hcl  History reviewed. No pertinent family history.  Social History Social History   Tobacco Use  .  Smoking status: Former Smoker    Last attempt to quit: 06/14/1983    Years since quitting: 34.3  . Smokeless tobacco: Never Used  Substance Use Topics  . Alcohol use: No  . Drug use: No    Review of Systems Constitutional: No fever/chills Eyes: No visual changes. ENT: No sore throat. Cardiovascular: Denies chest pain. Respiratory: Denies shortness of breath. Gastrointestinal: No abdominal pain.  No nausea, no vomiting.   No diarrhea.  No constipation. Genitourinary: Negative for dysuria. Musculoskeletal: Negative for back pain except for chronic lower back pain without change today.  Neck pain Skin: Negative for rash. Neurological: Negative for headaches, focal weakness or numbness.    ____________________________________________   PHYSICAL EXAM:  VITAL SIGNS: ED Triage Vitals  Enc Vitals Group     BP 09/23/17 1608 123/75     Pulse Rate 09/23/17 1608 (!) 101     Resp 09/23/17 1608 20     Temp 09/23/17 1608 97.6 F (36.4 C)     Temp Source 09/23/17 1608 Oral     SpO2 09/23/17 1608 97 %     Weight 09/23/17 1609 122 lb (55.3 kg)     Height 09/23/17 1609 5\' 2"  (1.575 m)     Head Circumference --      Peak Flow --      Pain Score 09/23/17 1608 0     Pain Loc --      Pain Edu? --      Excl. in [place]? --     Constitutional: Alert and oriented. Well appearing and in no acute distress.  Appears chronically frail but in no distress. Eyes: Conjunctivae are normal. Head: Atraumatic. Nose: No congestion/rhinnorhea. Mouth/Throat: Mucous membranes are moist. Neck: No stridor.   Cardiovascular: Normal rate, regular rhythm. Grossly normal heart sounds.  Good peripheral circulation. Respiratory: Normal respiratory effort.  No retractions. Lungs CTAB. Gastrointestinal: Soft and nontender. No distention.  No tenderness throughout the abdomen.  No suprapubic pain. Musculoskeletal:   Right hand Median, ulnar, radial motor intact. Cap refill less than 2 seconds all digits. Strong radial pulse. 5 out of 5 strength throughout the hand intrinsics, flexion and extension at the wrist. No evidence of trauma.  Left hand Median, ulnar, radial motor intact. Cap refill less than 2 seconds all digits. Strong radial pulse. 5 out of 5 strength throughout the hand intrinsics, flexion and extension at the wrist. No evidence of trauma except for a small skin tear over the palmar surface of the left fourth digit without  foreign body or deep involvement. ____________________________________________  Lower Extremities  No edema. Normal DP/PT pulses bilateral with good cap refill.  Normal neuro-motor function lower extremities bilateral.  RIGHT Right lower extremity demonstrates normal strength, good use of all muscles. No edema bruising or contusions of the right hip, right knee, right ankle. Full range of motion of the right lower extremity without pain. No pain on axial loading. No evidence of trauma.  LEFT Left lower extremity demonstrates normal strength, good use of all muscles. No edema bruising or contusions of the hip,  knee, ankle. Full range of motion of the left lower extremity without pain. No pain on axial loading. No evidence of trauma.   Neurologic:  Normal speech and language. No gross focal neurologic deficits are appreciated.  Skin:  Skin is warm, dry and intact. No rash noted. Psychiatric: Mood and affect are normal. Speech and behavior are normal.  ____________________________________________   LABS (all labs ordered are listed, but only  abnormal results are displayed)  Labs Reviewed  URINALYSIS, ROUTINE W REFLEX MICROSCOPIC - Abnormal; Notable for the following components:      Result Value   Color, Urine AMBER (*)    APPearance CLOUDY (*)    Hgb urine dipstick MODERATE (*)    Ketones, ur 20 (*)    Protein, ur 30 (*)    Nitrite POSITIVE (*)    Leukocytes, UA LARGE (*)    Squamous Epithelial / LPF 0-5 (*)    All other components within normal limits  CBC - Abnormal; Notable for the following components:   WBC 12.0 (*)    All other components within normal limits  BASIC METABOLIC PANEL - Abnormal; Notable for the following components:   CO2 21 (*)    Glucose, Bld 104 (*)    Creatinine, Ser 1.03 (*)    GFR calc non Af Amer 46 (*)    GFR calc Af Amer 54 (*)    All other components within normal limits  URINE CULTURE    ____________________________________________  EKG   ____________________________________________  RADIOLOGY  Ct Head Wo Contrast  Result Date: 09/23/2017 CLINICAL DATA:  Fall.  On Eliquis. EXAM: CT HEAD WITHOUT CONTRAST TECHNIQUE: Contiguous axial images were obtained from the base of the skull through the vertex without intravenous contrast. COMPARISON:  Head CT dated August 05, 2017. FINDINGS: Brain: No evidence of acute infarction, hemorrhage, hydrocephalus, extra-axial collection or mass lesion/mass effect. Stable mild atrophy and chronic microvascular ischemic changes. Vascular: No hyperdense vessel or unexpected calcification. Skull: Normal. Negative for fracture or focal lesion. Sinuses/Orbits: No acute finding. Other: None. IMPRESSION: 1.  No acute intracranial abnormality. Electronically Signed   By: Titus Dubin M.D.   On: 09/23/2017 17:12    CT negative for acute. ____________________________________________   PROCEDURES  Procedure(s) performed: None  Procedures  Critical Care performed: No  ____________________________________________   INITIAL IMPRESSION / ASSESSMENT AND PLAN / ED COURSE  Pertinent labs & imaging results that were available during my care of the patient were reviewed by me and considered in my medical decision making (see chart for details).  Patient presents for evaluation, first off for a fall.  She fell on the curb tripped, struck her head though no evidence of trauma is noted on exam.  Nexus negative for c-spine injury.   No indication of significant trauma, small skin tear is noted over the left fourth digit.  Irrigated, Tdap updated, and patient will be placed on cephalexin as prophylaxis and also for treatment of UTI.  He also presents for evaluation of dysuria, urinalysis suggest UTI.  Symptomatology same.  No signs or symptoms of sepsis based on clinical assessment, some very mild tachycardia and will check basic labs as well as  checking a hemoglobin given that she takes anticoagulant for pulmonary embolism, and also chemistry and provide hydration here.  She appears appropriate for ongoing outpatient therapy if her labs are reassuring.  She is awake alert well oriented, family at the bedside very supportive.    ----------------------------------------- 7:40 PM on 09/23/2017 -----------------------------------------  Patient resting comfortably, skin tear cleansed, irrigated and bandaged.  No complaints.  Will prescribe cephalexin for treatment of UTI and also prophylaxis regarding hand injury.  CT head negative for acute.  Discharge the patient home, follow-up closely with primary doctor. Return precautions and treatment recommendations and follow-up discussed with the patient who is agreeable with the plan.  ----------------------------------------- 8:03 PM on 09/23/2017 -----------------------------------------  Patient got up, reports that she  is having some slight discomfort pointing towards her left anterior pelvic region in the region of the pubic ramus on the left.  She is able to range the hip well without any pain.  No pain on axial loading.  No other injury.  No evidence of bruising or injury, but she does have some tenderness across the anterior bony pelvis.  Suspect very very low risk for any hip injury, but have potentially pubic ramus fracture.  We will give her hydrocodone when she and her daughter reports she takes at home for chronic back pain, and for an x-ray.  If negative comfortable with plan for discharge and advised on careful return precautions and follow-up care.  Ongoing care assigned to Dr. Quentin Cornwall, follow-up on x-ray of the pelvis and left hip ____________________________________________   FINAL CLINICAL IMPRESSION(S) / ED DIAGNOSES  Final diagnoses:  Acute cystitis without hematuria  Fall, initial encounter  Skin tear of hand without complication, initial encounter  Dehydration, mild       NEW MEDICATIONS STARTED DURING THIS VISIT:  New Prescriptions   CEPHALEXIN (KEFLEX) 250 MG CAPSULE    Take 1 capsule (250 mg total) by mouth 3 (three) times daily for 7 days.     Note:  This document was prepared using Dragon voice recognition software and may include unintentional dictation errors.     Delman Kitten, MD 09/23/17 Donney Dice    Delman Kitten, MD 09/23/17 2004

## 2017-09-23 NOTE — ED Provider Notes (Signed)
Patient received in sign-out from Dr. Jacqualine Code.  Workup and evaluation pending x-ray and reassessment.  X-ray shows no evidence of fracture or dislocation.  Patient's pain significantly improved.  At this point do believe patient is stable and appropriate for follow-up as an outpatient.Merlyn Lot, MD 09/23/17 2103

## 2017-09-23 NOTE — ED Notes (Signed)

## 2017-09-23 NOTE — Discharge Instructions (Signed)
You have been seen in the Emergency Department (ED) today for pain when urinating.  Your workup today suggests that you have a urinary tract infection (UTI). Thankfully, your CT scan showed no bleeding around the brain after your fall.  Call your regular doctor to schedule the next available appointment to follow up on today?s ED visit, or return immediately to the ED if your pain worsens, you have decreased urine production, develop fever, persistent vomiting, or other symptoms that concern you.

## 2017-09-23 NOTE — ED Triage Notes (Signed)
Pt reports that she was going to the walk in clinic because she has a kidney infection, when she was going in she states that she wasn't paying attention and slipped and fell and hit her head on the curb. Denies LOC. She reports that she is on Eliquist.

## 2017-09-26 LAB — URINE CULTURE
Culture: >=100000 — AB
SPECIAL REQUESTS: NORMAL

## 2017-10-23 ENCOUNTER — Encounter: Payer: Self-pay | Admitting: Neurology

## 2017-10-23 ENCOUNTER — Ambulatory Visit: Payer: Medicare Other | Admitting: Neurology

## 2017-10-23 VITALS — BP 132/66 | HR 81 | Ht 64.0 in | Wt 121.0 lb

## 2017-10-23 DIAGNOSIS — F0390 Unspecified dementia without behavioral disturbance: Secondary | ICD-10-CM | POA: Diagnosis not present

## 2017-10-23 DIAGNOSIS — F03A0 Unspecified dementia, mild, without behavioral disturbance, psychotic disturbance, mood disturbance, and anxiety: Secondary | ICD-10-CM

## 2017-10-23 MED ORDER — DONEPEZIL HCL 10 MG PO TABS: ORAL_TABLET | ORAL | 3 refills | Status: DC

## 2017-10-23 NOTE — Progress Notes (Addendum)
NEUROLOGY CONSULTATION NOTE  NHUNG Banks MRN: [id number] DOB: 1925-09-23  Referring provider: Dr. Kelton Pillar Primary care provider: Dr. Kelton Pillar  Reason for consult:  Memory difficulty, confusion  Dear Dr Susan Banks:  Thank you for your kind referral of Susan Banks for consultation of the above symptoms. Although her history is well known to you, please allow me to reiterate it for the purpose of our medical record. The patient was accompanied to the clinic by her daughter who also provides collateral information. Records and images were personally reviewed where available.  HISTORY OF PRESENT ILLNESS: This is a pleasant 84 year old right-handed woman with a history of hypertension, hyperlipidemia, chronic kidney disease, presenting for evaluation of memory changes and confusion. Her daughter reports that she had a significant change in mental status last November 2018, "like a flipped switch." She had been having minor forgetfulness prior to this, such as repeating herself, but last November 2018 when she came to their home, the patient did not recognize her daughter or where she was. She thought her daughter was her childhood frind. She thought she was talking to her father and that her father would be there when they got home. She recalls going to her PCP and feeling confused. She had bloodwork, urinalysis, and head CT which were unremarkable. Her daughter was instructed to keep her off hydrocodone and Benadryl. Her daughter reports that she gradually improved, and was 90-95% back to normal within 82 weeks. She was almost completely clear cognitively when she was brought to [facility] on 08/15/17 for shortness of breath and left axilla pain and was found to have pulmonary embolism and DVT. She is now on Eliquis. She reports her memory can be bad sometimes, other times it's pretty good. She sometimes thinks the house is in the wrong place. Her daughter reports sleep has never been good, but  is better with 1/2 a pain pill and Benadryl. She lives with her husband, her daughter comes daily to administer her medications. She has stopped driving since November. At that time, she could not find a familiar store. Her daughter has been in charge of bills since she had C.diff infection 3 years ago and was missing payments. She frequently misplaces things. She has left the stove on and burned food. Her daughter denies any paranoia. There is no family history of dementia. She fell getting out of the car a few weeks ago, no significant head injuries. No alcohol use.   She has infrequent mild headaches. She has back pain and constipation. She gets shaky when nervous. She denies any dizziness, diplopia, dysarthria/dysphagia, neck pain, focal numbness/tingling/weakness, bladder dysfunction, anosmia.    PAST MEDICAL HISTORY: Past Medical History:  Diagnosis Date  . C. difficile diarrhea   . Chronic kidney disease   . Constipation   . GERD (gastroesophageal reflux disease)   . Hypercholesteremia   . Hypertension   . Shortness of breath     PAST SURGICAL HISTORY: Past Surgical History:  Procedure Laterality Date  . ABDOMINAL SURGERY    . ESOPHAGOGASTRODUODENOSCOPY N/A 07/04/2014   Procedure: ESOPHAGOGASTRODUODENOSCOPY (EGD);  Surgeon: Laverda Page, MD;  Location: [place];  Service: Cardiovascular;  Laterality: N/A;  . TEE WITHOUT CARDIOVERSION N/A 06/13/2014   Procedure: TRANSESOPHAGEAL ECHOCARDIOGRAM (TEE);  Surgeon: Laverda Page, MD;  Location: [place];  Service: Cardiovascular;  Laterality: N/A;    MEDICATIONS: Current Outpatient Medications on File Prior to Visit  Medication Sig Dispense Refill  . amLODipine (NORVASC) 5  MG tablet Take 0.5 tablets (2.5 mg total) by mouth daily.    [name] apixaban (ELIQUIS) 5 MG TABS tablet 2 tablets orally twice a day for six days then one tablet twice a day afterwards 72 tablet 0  . cholecalciferol (VITAMIN D) 1000 UNITS tablet Take  1,000 Units by mouth daily.    [name] omeprazole (PRILOSEC) 20 MG capsule Take 20 mg by mouth daily.    [name] saccharomyces boulardii (FLORASTOR) 250 MG capsule Take 1 capsule (250 mg total) by mouth 2 (two) times daily. 60 capsule 3   No current facility-administered medications on file prior to visit.     ALLERGIES: Allergies  Allergen Reactions  . Codeine Itching    Has not tried benadryl along with medication  . Sulfa Antibiotics Nausea And Vomiting  . Tramadol Hcl Itching    FAMILY HISTORY: No family history on file.  SOCIAL HISTORY: Social History   Socioeconomic History  . Marital status: Married    Spouse name: Not on file  . Number of children: Not on file  . Years of education: Not on file  . Highest education level: Not on file  Social Needs  . Financial resource strain: Not on file  . Food insecurity - worry: Not on file  . Food insecurity - inability: Not on file  . Transportation needs - medical: Not on file  . Transportation needs - non-medical: Not on file  Occupational History  . Not on file  Tobacco Use  . Smoking status: Former Smoker    Last attempt to quit: 06/14/1983    Years since quitting: 34.3  . Smokeless tobacco: Never Used  Substance and Sexual Activity  . Alcohol use: No  . Drug use: No  . Sexual activity: Not on file  Other Topics Concern  . Not on file  Social History Narrative  . Not on file    REVIEW OF SYSTEMS: Constitutional: No fevers, chills, or sweats, no generalized fatigue, change in appetite Eyes: No visual changes, double vision, eye pain Ear, nose and throat: No hearing loss, ear pain, nasal congestion, sore throat Cardiovascular: No chest pain, palpitations Respiratory:  No shortness of breath at rest or with exertion, wheezes GastrointestinaI: No nausea, vomiting, diarrhea, abdominal pain, fecal incontinence Genitourinary:  No dysuria, urinary retention or frequency Musculoskeletal:  No neck pain, +back pain Integumentary:  No rash, pruritus, skin lesions Neurological: as above Psychiatric: No depression, insomnia, +anxiety Endocrine: No palpitations, fatigue, diaphoresis, mood swings, change in appetite, change in weight, increased thirst Hematologic/Lymphatic:  No anemia, purpura, petechiae. Allergic/Immunologic: no itchy/runny eyes, nasal congestion, recent allergic reactions, rashes  PHYSICAL EXAM: Vitals:   10/23/17 0907  BP: 132/66  Pulse: 81  SpO2: 94%   General: No acute distress, anxious appearing Head:  Normocephalic/atraumatic Eyes: Fundoscopic exam shows bilateral sharp discs, no vessel changes, exudates, or hemorrhages Neck: supple, no paraspinal tenderness, full range of motion Back: No paraspinal tenderness Heart: regular rate and rhythm Lungs: Clear to auscultation bilaterally. Vascular: No carotid bruits. Skin/Extremities: No rash, no edema Neurological Exam: Mental status: alert and oriented to person, place, and time, no dysarthria or aphasia, Fund of knowledge is appropriate.  Recent and remote memory are intact.  Attention and concentration are normal.    Able to name objects and repeat phrases. CDT 5/5 MMSE - Mini Mental State Exam 10/23/2017  Orientation to time 1  Orientation to Place 4  Registration 3  Attention/ Calculation 5  Recall 0  Language- name 2 objects 2  Language- repeat 1  Language- follow 3 step command 3  Language- read & follow direction 1  Write a sentence 1  Copy design 0  Total score 21   Cranial nerves: CN I: not tested CN II: pupils equal, round and reactive to light, visual fields intact, fundi unremarkable. CN III, IV, VI:  full range of motion, no nystagmus, no ptosis CN V: facial sensation intact CN VII: upper and lower face symmetric CN VIII: hearing intact to finger rub CN IX, X: gag intact, uvula midline CN XI: sternocleidomastoid and trapezius muscles intact CN XII: tongue midline Bulk & Tone: normal, no fasciculations. Motor: 5/5  throughout with no pronator drift. Sensation: decreased cold on left hand, intact pin, decreased vibration to knees bilaterally. No extinction to double simultaneous stimulation.  Romberg test negative Deep Tendon Reflexes: +2 both UE and left patella, unable to elicit right patellar reflex, absent ankle jerks bilaterally, no ankle clonus Plantar responses: downgoing bilaterally Cerebellar: no incoordination on finger to nose, heel to shin. No dysdiadochokinesia Gait: slow and cautious, difficulty with tandem walk Tremor: tremulous, no resting tremor, +mild postural and endpoint tremor bilaterally  IMPRESSION: This is a 84 year old right-handed woman with a history of hypertension, hyperlipidemia, CKD, presenting after a sudden change in mental status last November 2018, infectious workup at that time was negative, symptoms improved over the next few weeks, then she was found to have pulmonary embolism and DVT in December 2018. Her daughter feels she is now 90-95% back to normal, MMSE today 21/30, indicating mild dementia. Head CT no acute changes, there is mild diffuse atrophy. We discussed starting Aricept, including side effects and expectations from the medication. She will start 5mg  daily for a month, then increase to 10mg  daily. Her daughter manages her medications. She has not been driving. She will follow-up in 6 months and knows to call for any changes.   Thank you for allowing me to participate in the care of this patient. Please do not hesitate to call for any questions or concerns.   Ellouise Newer, M.D.  CC: Dr. Laurann Banks

## 2017-10-23 NOTE — Patient Instructions (Signed)
1. Start Aricept 10mg : take 1/2 tablet daily for a month, then increase to 1 tablet daily 2. Follow-up in 6 months, call for any changes  FALL PRECAUTIONS: Be cautious when walking. Scan the area for obstacles that may increase the risk of trips and falls. When getting up in the mornings, sit up at the edge of the bed for a few minutes before getting out of bed. Consider elevating the bed at the head end to avoid drop of blood pressure when getting up. Walk always in a well-lit room (use night lights in the walls). Avoid area rugs or power cords from appliances in the middle of the walkways. Use a walker or a cane if necessary and consider physical therapy for balance exercise. Get your eyesight checked regularly.  FINANCIAL OVERSIGHT: Supervision, especially oversight when making financial decisions or transactions is also recommended.  HOME SAFETY: Consider the safety of the kitchen when operating appliances like stoves, microwave oven, and blender. Consider having supervision and share cooking responsibilities until no longer able to participate in those. Accidents with firearms and other hazards in the house should be identified and addressed as well.  DRIVING: Regarding driving, in patients with progressive memory problems, driving will be impaired. We advise to have someone else do the driving if trouble finding directions or if minor accidents are reported. Independent driving assessment is available to determine safety of driving.  ABILITY TO BE LEFT ALONE: If patient is unable to contact 911 operator, consider using LifeLine, or when the need is there, arrange for someone to stay with patients. Smoking is a fire hazard, consider supervision or cessation. Risk of wandering should be assessed by caregiver and if detected at any point, supervision and safe proof recommendations should be instituted.  MEDICATION SUPERVISION: Inability to self-administer medication needs to be constantly addressed.  Implement a mechanism to ensure safe administration of the medications.  RECOMMENDATIONS FOR ALL PATIENTS WITH MEMORY PROBLEMS: 1. Continue to exercise (Recommend 30 minutes of walking everyday, or 3 hours every week) 2. Increase social interactions - continue going to [place] and enjoy social gatherings with friends and family 3. Eat healthy, avoid fried foods and eat more fruits and vegetables 4. Maintain adequate blood pressure, blood sugar, and blood cholesterol level. Reducing the risk of stroke and cardiovascular disease also helps promoting better memory. 5. Avoid stressful situations. Live a simple life and avoid aggravations. Organize your time and prepare for the next day in anticipation. 6. Sleep well, avoid any interruptions of sleep and avoid any distractions in the bedroom that may interfere with adequate sleep quality 7. Avoid sugar, avoid sweets as there is a strong link between excessive sugar intake, diabetes, and cognitive impairment We discussed the Mediterranean diet, which has been shown to help patients reduce the risk of progressive memory disorders and reduces cardiovascular risk. This includes eating fish, eat fruits and green leafy vegetables, nuts like almonds and hazelnuts, walnuts, and also use olive oil. Avoid fast foods and fried foods as much as possible. Avoid sweets and sugar as sugar use has been linked to worsening of memory function.  There is always a concern of gradual progression of memory problems. If this is the case, then we may need to adjust level of care according to patient needs. Support, both to the patient and caregiver, should then be put into place.

## 2017-11-29 ENCOUNTER — Other Ambulatory Visit: Payer: Self-pay | Admitting: Neurology

## 2018-05-03 ENCOUNTER — Ambulatory Visit: Payer: Medicare Other | Admitting: Neurology

## 2018-11-29 ENCOUNTER — Telehealth: Payer: Self-pay | Admitting: Neurology

## 2018-11-29 MED ORDER — DONEPEZIL HCL 10 MG PO TABS: ORAL_TABLET | ORAL | 0 refills | Status: DC

## 2018-11-29 NOTE — Telephone Encounter (Signed)
Donepezil 10mg  #90 with no refills Sig = take 1 tab QHS  Sent to [facility] order pharmacy.   No refills given due to pt not being seen in over a year.  90 day supply will last until scheduled appointment.  New Rx will be written at that time.

## 2018-11-29 NOTE — Telephone Encounter (Signed)
Patient needs a new RX on Donepezil  [facility]

## 2018-12-05 IMAGING — CT CT HEAD W/O CM
4 series · 16 of 47 positions shown, 18 images · non-contrast
Comparison: Head CT dated August 05, 2017.

CLINICAL DATA: Fall.  On Eliquis.

EXAM:
CT HEAD WITHOUT CONTRAST
TECHNIQUE: Contiguous axial images were obtained from the base of the skull
through the vertex without intravenous contrast.

[Series 6: head wo · axial · 0.41mm/px · z∈[+380,+485]mm · 7 of 29 slices shown, 9 images]
[im 4/29  brain]
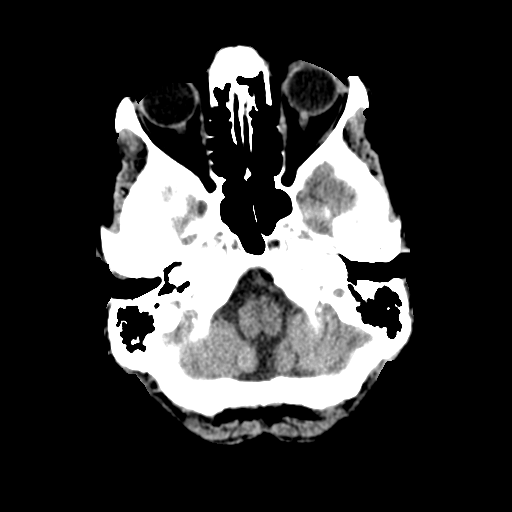
[im 4/29  bone]
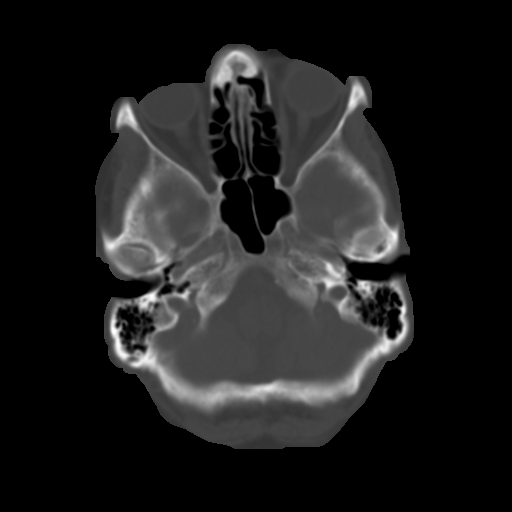
[im 8/29  brain]
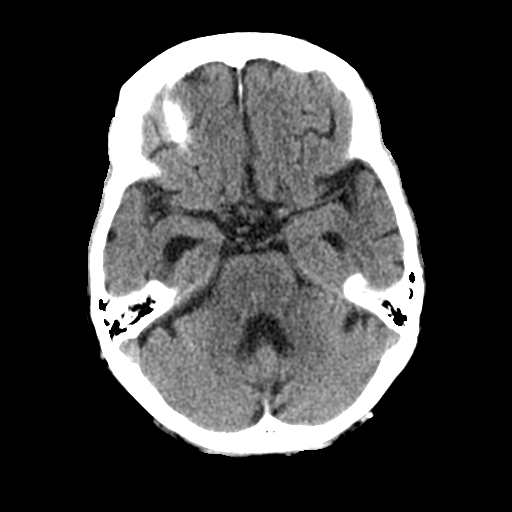
[im 11/29  brain]
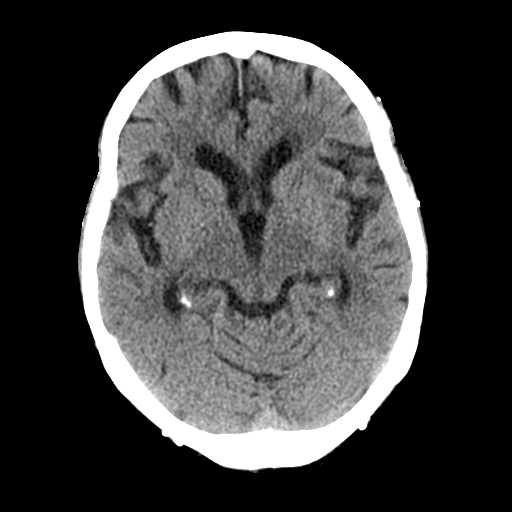
[im 15/29  brain]
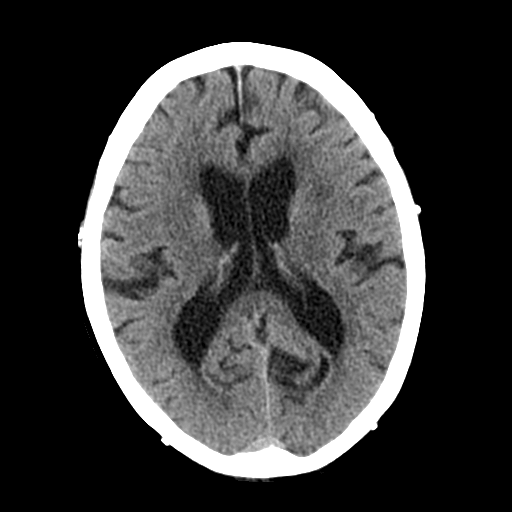
[im 18/29  brain]
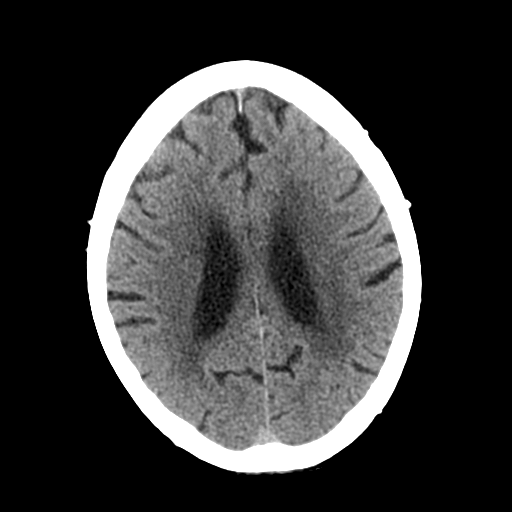
[im 18/29  bone]
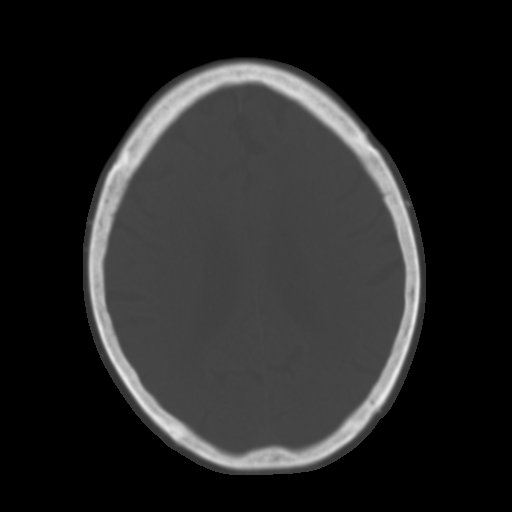
[im 22/29  brain]
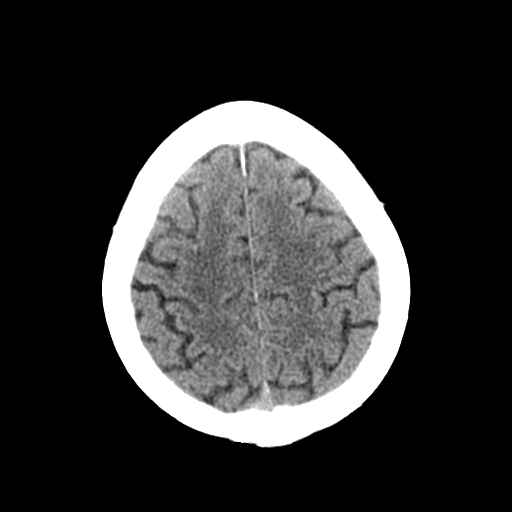
[im 25/29  brain]
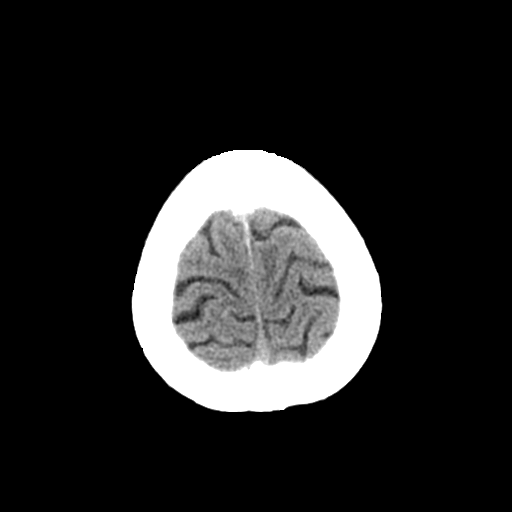

[Series 7: head bone · axial · 0.41mm/px · z∈[+379,+407]mm · 3 of 72 slices shown]
[im 8/72  bone]
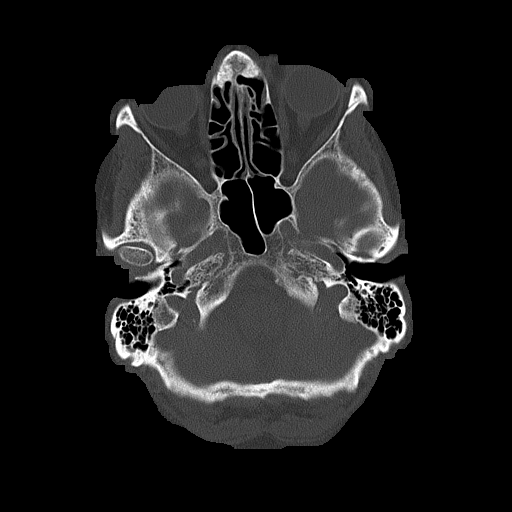
[im 15/72  bone]
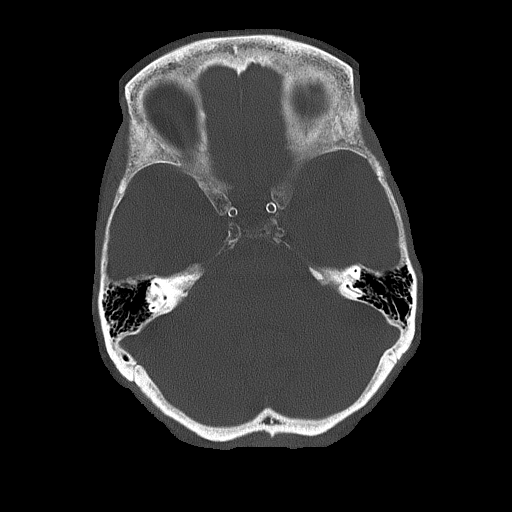
[im 22/72  bone]
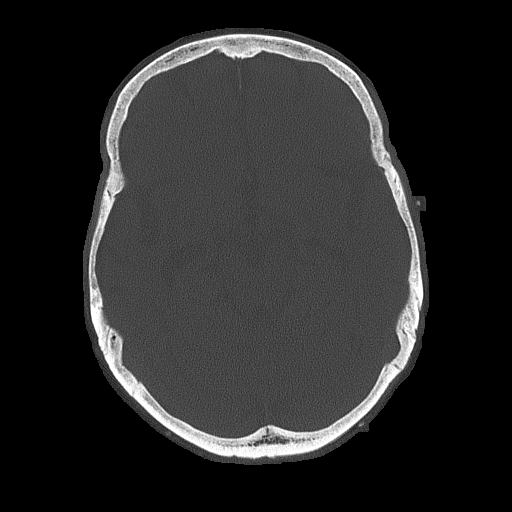

[Series 8: coronal soft tissue · coronal · 0.29mm/px · 3 of 62 slices shown]
[im 21/62  brain]
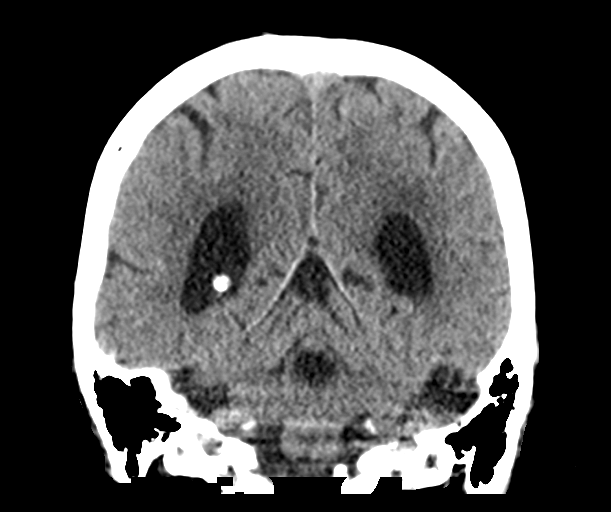
[im 28/62  brain]
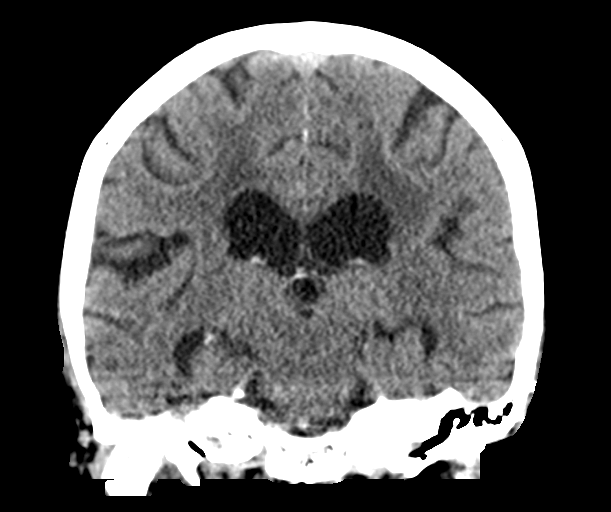
[im 34/62  brain]
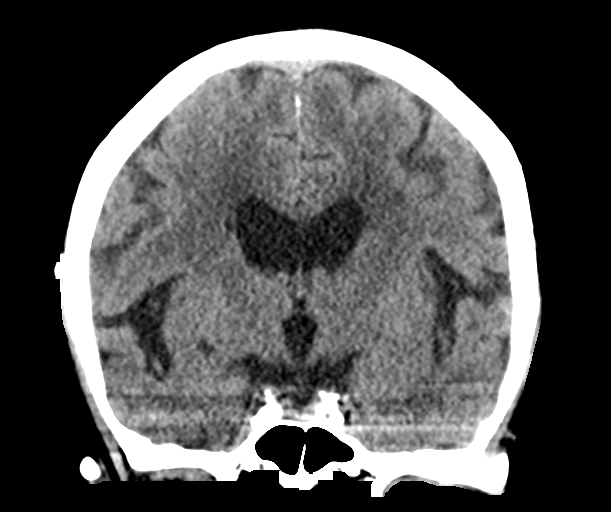

[Series 9: sagittal soft tissue · sagittal · 0.30mm/px · 3 of 49 slices shown]
[im 17/49  brain]
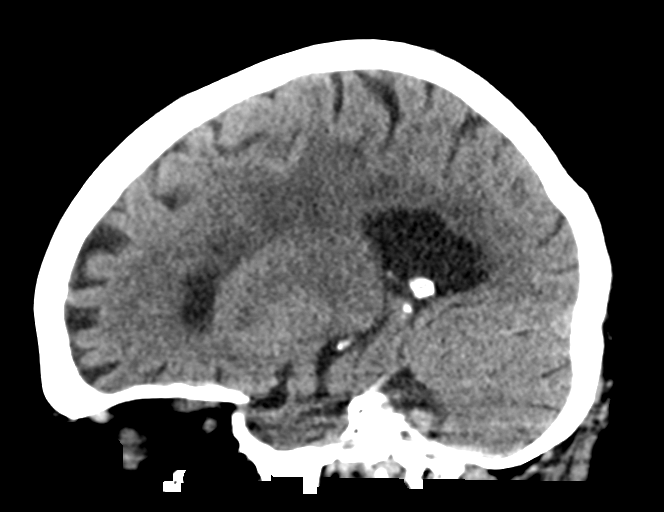
[im 25/49  brain]
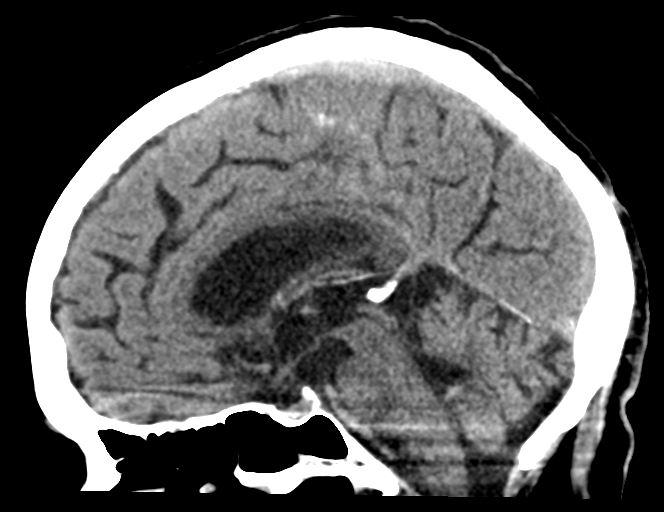
[im 33/49  brain]
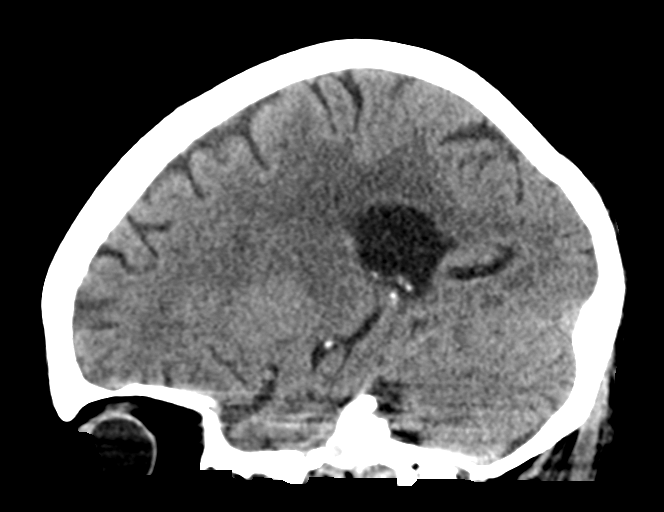

[16 of 47 positions shown; findings below may reference images not displayed]

FINDINGS: Brain: No evidence of acute infarction, hemorrhage, hydrocephalus,
extra-axial collection or mass lesion/mass effect. Stable mild
atrophy and chronic microvascular ischemic changes.

Vascular: No hyperdense vessel or unexpected calcification.

Skull: Normal. Negative for fracture or focal lesion.

Sinuses/Orbits: No acute finding.

Other: None.
IMPRESSION: 1.  No acute intracranial abnormality.

## 2018-12-13 ENCOUNTER — Encounter

## 2018-12-13 ENCOUNTER — Ambulatory Visit: Payer: Medicare Other | Admitting: Neurology

## 2018-12-15 ENCOUNTER — Encounter: Payer: Self-pay | Admitting: *Deleted

## 2019-01-05 ENCOUNTER — Other Ambulatory Visit: Payer: Self-pay | Admitting: *Deleted

## 2019-01-27 ENCOUNTER — Other Ambulatory Visit: Payer: Self-pay | Admitting: *Deleted

## 2019-01-27 NOTE — Patient Outreach (Signed)
[facility]) Care Management  01/27/2019  AARIONA MOMON 1926-09-07 [id number]  Follow up from HRA screening phone call attempts placed to patient on 12/10/2018 and 12/15/2018- both calls were unsuccessful and patient was mailed unsuccessful letter on 12/15/2018 without patient call-back to date.  Patient to be followed in [facility] HRA engaged program.   Plan:  Will re-attempt call to patient within 2 months of initial unsuccessful attempts previously made  Oneta Rack, RN, BSN, [place] Care Management  [phone]

## 2019-02-07 ENCOUNTER — Ambulatory Visit: Payer: Self-pay | Admitting: *Deleted

## 2019-02-21 ENCOUNTER — Telehealth (INDEPENDENT_AMBULATORY_CARE_PROVIDER_SITE_OTHER): Payer: PPO | Admitting: Neurology

## 2019-02-21 ENCOUNTER — Other Ambulatory Visit: Payer: Self-pay

## 2019-02-21 ENCOUNTER — Encounter: Payer: Self-pay | Admitting: Neurology

## 2019-02-21 VITALS — Ht 63.0 in | Wt 121.0 lb

## 2019-02-21 DIAGNOSIS — F32A Depression, unspecified: Secondary | ICD-10-CM

## 2019-02-21 DIAGNOSIS — F0390 Unspecified dementia without behavioral disturbance: Secondary | ICD-10-CM | POA: Diagnosis not present

## 2019-02-21 DIAGNOSIS — F03A0 Unspecified dementia, mild, without behavioral disturbance, psychotic disturbance, mood disturbance, and anxiety: Secondary | ICD-10-CM

## 2019-02-21 DIAGNOSIS — F329 Major depressive disorder, single episode, unspecified: Secondary | ICD-10-CM

## 2019-02-21 MED ORDER — MIRTAZAPINE 15 MG PO TABS: 15.0000 mg | ORAL_TABLET | Freq: Every day | ORAL | 11 refills | Status: DC

## 2019-02-21 MED ORDER — DONEPEZIL HCL 10 MG PO TABS: ORAL_TABLET | ORAL | 3 refills | Status: DC

## 2019-02-21 NOTE — Progress Notes (Addendum)
Virtual Visit via Video Note The purpose of this virtual visit is to provide medical care while limiting exposure to the novel coronavirus.    Consent was obtained for video visit:  Yes.   Answered questions that patient had about telehealth interaction:  Yes.   I discussed the limitations, risks, security and privacy concerns of performing an evaluation and management service by telemedicine. I also discussed with the patient that there may be a patient responsible charge related to this service. The patient expressed understanding and agreed to proceed.  Pt location: Home Physician Location: office Name of referring provider:  Kelton Pillar, MD I connected with Alvia Grove at patients initiation/request on 02/21/2019 at  3:00 PM EDT by video enabled telemedicine application and verified that I am speaking with the correct person using two identifiers. Pt MRN:  [id number] Pt DOB:  07/19/1926 Video Participants:  Alvia Grove;  Bill Regan (son-in-law)   History of Present Illness: The patient was last seen more than a year ago for mild dementia. Her son-in-law is present during the e-visit to provide additional information. MMSE 21/30 in February 2019. She was started on Donepezil 10mg  daily, which she has been taking without side effects. She feels her memory is okay. Her son-in-law reports that there has been a time or two where she has had an episode where she has more trouble than other days. Sometimes she would be slower all day, seemingly correlating with her hydration levels. Her daughter fixes her medications and comes daily to give the medications for that day. Daughter manages finances. She does not drive. She is independent with dressing and bathing. She admits to increased irritability, she gets so irritated sometimes "I can't stand myself." She states she always had a problem with her nerves and used to take medication that was stopped after she had a rash, she cannot recall  the medication but it did help with mood. She admits to hallucinations, she has had 2 episodes where she woke up at night and saw people standing at her window. Last episode was last month. No paranoia. She has sleep difficulties and sometimes takes hydrocodone when she tosses and turns at night. She has frequent headaches with neck pain and arthritis, she had one this morning. No nausea/vomiting, she rarely takes medication for headaches. No dizziness, vision changes, no falls.   History on Initial Assessment 10/23/2017: This is a pleasant 85 year old right-handed woman with a history of hypertension, hyperlipidemia, chronic kidney disease, presenting for evaluation of memory changes and confusion. Her daughter reports that she had a significant change in mental status last November 2018, "like a flipped switch." She had been having minor forgetfulness prior to this, such as repeating herself, but last November 2018 when she came to their home, the patient did not recognize her daughter or where she was. She thought her daughter was her childhood friend. She thought she was talking to her father and that her father would be there when they got home. She recalls going to her PCP and feeling confused. She had bloodwork, urinalysis, and head CT which were unremarkable. Her daughter was instructed to keep her off hydrocodone and Benadryl. Her daughter reports that she gradually improved, and was 90-95% back to normal within 4 weeks. She was almost completely clear cognitively when she was brought to [facility] on 08/15/17 for shortness of breath and left axilla pain and was found to have pulmonary embolism and DVT. She is now on Eliquis. She  reports her memory can be bad sometimes, other times it's pretty good. She sometimes thinks the house is in the wrong place. Her daughter reports sleep has never been good, but is better with 1/2 a pain pill and Benadryl. She lives with her husband, her daughter comes daily to administer  her medications. She has stopped driving since November. At that time, she could not find a familiar store. Her daughter has been in charge of bills since she had C.diff infection 3 years ago and was missing payments. She frequently misplaces things. She has left the stove on and burned food. Her daughter denies any paranoia. There is no family history of dementia. She fell getting out of the car a few weeks ago, no significant head injuries. No alcohol use.   She has infrequent mild headaches. She has back pain and constipation. She gets shaky when nervous. She denies any dizziness, diplopia, dysarthria/dysphagia, neck pain, focal numbness/tingling/weakness, bladder dysfunction, anosmia.     Current Outpatient Medications on File Prior to Visit  Medication Sig Dispense Refill   amLODipine (NORVASC) 5 MG tablet Take 0.5 tablets (2.5 mg total) by mouth daily. (Patient taking differently: Take 5 mg by mouth daily. )     apixaban (ELIQUIS) 5 MG TABS tablet 2 tablets orally twice a day for six days then one tablet twice a day afterwards 72 tablet 0   cholecalciferol (VITAMIN D) 1000 UNITS tablet Take 1,000 Units by mouth daily.     HYDROcodone-acetaminophen (NORCO/VICODIN) 5-325 MG tablet TAKE 1 TABLET BY MOUTH AT BEDTIME AS NEEDED FOR BACK PAIN     omeprazole (PRILOSEC) 20 MG capsule Take 20 mg by mouth daily.     tiZANidine (ZANAFLEX) 4 MG tablet TAKE 1 TABLET AS NEEDED AT BEDTIME AS NEEDED FOR BACK PAIN ORALLY     No current facility-administered medications on file prior to visit.      Observations/Objective:   Vitals:   02/21/19 1244  Weight: 121 lb (54.9 kg)  Height: 5\' 3"  (1.6 m)   GEN:  The patient appears stated age and is in NAD.  Neurological examination: Patient is awake, alert, oriented x 2. No aphasia or dysarthria. Intact fluency and comprehension. Remote and recent memory impaired. Able to name and repeat. Cranial nerves: Extraocular movements intact with no nystagmus. No  facial asymmetry. Motor: moves all extremities symmetrically, at least anti-gravity x 4. No incoordination on finger to nose testing. Gait: narrow-based and steady, able to tandem walk adequately. Negative Romberg test.  MMSE - Mini Mental State Exam 02/22/2019 10/23/2017  Orientation to time 0 1  Orientation to Place 5 4  Registration 3 3  Attention/ Calculation 5 5  Recall 2 0  Language- name 2 objects 2 2  Language- repeat 1 1  Language- follow 3 step command 3 3  Language- read & follow direction 1 1  Write a sentence 1 1  Copy design 0 0  Total score 23 21    Assessment and Plan:   This is a pleasant 85 yo RH woman with a history of hypertension, hyperlipidemia, CKD, with mild dementia. MMSE 23/30 (21/30 in February 2019). She is on Donepezil 10mg  daily without side effects. She is having a lot of irritability and sleep difficulties, and is agreeable to starting low dose mirtazapine 15mg  qhs. Side effects discussed, may take 1/2 tablet if too drowsy. Continue close supervision at home. She does not drive. She will follow-up in 6 months and knows to call for any changes.  Follow Up Instructions:   -I discussed the assessment and treatment plan with the patient. The patient was provided an opportunity to ask questions and all were answered. The patient agreed with the plan and demonstrated an understanding of the instructions.   The patient was advised to call back or seek an in-person evaluation if the symptoms worsen or if the condition fails to improve as anticipated.     Cameron Sprang, MD

## 2019-02-22 ENCOUNTER — Other Ambulatory Visit: Payer: Self-pay | Admitting: *Deleted

## 2019-02-22 NOTE — Patient Outreach (Signed)
[facility]) Care Management [facility] CM Case Closure note Patient active in external program 02/22/2019  Susan Banks 1926-05-04 [id number]  [place] Management Case Closure note from previously placed [facility] HRA screening phone call  Received confirmation from [place] team that patient is now active with an external program  Plan:  Will close patient case and make patient inactive with [facility] CM  Oneta Rack, RN, BSN, [place] Coordinator [facility] Care Management  [phone]

## 2019-02-24 ENCOUNTER — Ambulatory Visit: Payer: Medicare Other | Admitting: Neurology

## 2019-02-25 DIAGNOSIS — R1013 Epigastric pain: Secondary | ICD-10-CM | POA: Diagnosis not present

## 2019-03-03 DIAGNOSIS — M549 Dorsalgia, unspecified: Secondary | ICD-10-CM | POA: Diagnosis not present

## 2019-03-03 DIAGNOSIS — I82402 Acute embolism and thrombosis of unspecified deep veins of left lower extremity: Secondary | ICD-10-CM | POA: Diagnosis not present

## 2019-03-03 DIAGNOSIS — R109 Unspecified abdominal pain: Secondary | ICD-10-CM | POA: Diagnosis not present

## 2019-04-07 DIAGNOSIS — I129 Hypertensive chronic kidney disease with stage 1 through stage 4 chronic kidney disease, or unspecified chronic kidney disease: Secondary | ICD-10-CM | POA: Diagnosis not present

## 2019-04-07 DIAGNOSIS — G4700 Insomnia, unspecified: Secondary | ICD-10-CM | POA: Diagnosis not present

## 2019-04-07 DIAGNOSIS — R413 Other amnesia: Secondary | ICD-10-CM | POA: Diagnosis not present

## 2019-04-07 DIAGNOSIS — Z1389 Encounter for screening for other disorder: Secondary | ICD-10-CM | POA: Diagnosis not present

## 2019-04-07 DIAGNOSIS — N183 Chronic kidney disease, stage 3 (moderate): Secondary | ICD-10-CM | POA: Diagnosis not present

## 2019-04-07 DIAGNOSIS — I440 Atrioventricular block, first degree: Secondary | ICD-10-CM | POA: Diagnosis not present

## 2019-04-07 DIAGNOSIS — I2699 Other pulmonary embolism without acute cor pulmonale: Secondary | ICD-10-CM | POA: Diagnosis not present

## 2019-04-07 DIAGNOSIS — I82402 Acute embolism and thrombosis of unspecified deep veins of left lower extremity: Secondary | ICD-10-CM | POA: Diagnosis not present

## 2019-04-07 DIAGNOSIS — E785 Hyperlipidemia, unspecified: Secondary | ICD-10-CM | POA: Diagnosis not present

## 2019-04-07 DIAGNOSIS — Z0000 Encounter for general adult medical examination without abnormal findings: Secondary | ICD-10-CM | POA: Diagnosis not present

## 2019-04-07 DIAGNOSIS — M5137 Other intervertebral disc degeneration, lumbosacral region: Secondary | ICD-10-CM | POA: Diagnosis not present

## 2019-04-07 DIAGNOSIS — M8580 Other specified disorders of bone density and structure, unspecified site: Secondary | ICD-10-CM | POA: Diagnosis not present

## 2019-05-21 ENCOUNTER — Other Ambulatory Visit: Payer: Self-pay | Admitting: Neurology

## 2019-08-24 ENCOUNTER — Ambulatory Visit: Payer: PPO | Admitting: Neurology

## 2019-08-24 ENCOUNTER — Encounter: Payer: Self-pay | Admitting: Neurology

## 2019-08-25 ENCOUNTER — Telehealth (INDEPENDENT_AMBULATORY_CARE_PROVIDER_SITE_OTHER): Payer: PPO | Admitting: Neurology

## 2019-08-25 ENCOUNTER — Other Ambulatory Visit: Payer: Self-pay

## 2019-08-25 VITALS — Ht 63.0 in | Wt 112.0 lb

## 2019-08-25 DIAGNOSIS — F03A0 Unspecified dementia, mild, without behavioral disturbance, psychotic disturbance, mood disturbance, and anxiety: Secondary | ICD-10-CM

## 2019-08-25 DIAGNOSIS — F0390 Unspecified dementia without behavioral disturbance: Secondary | ICD-10-CM

## 2019-08-25 MED ORDER — DONEPEZIL HCL 10 MG PO TABS: 10.0000 mg | ORAL_TABLET | Freq: Every day | ORAL | 3 refills | Status: DC

## 2019-08-25 NOTE — Progress Notes (Signed)
Virtual Visit via Video Note The purpose of this virtual visit is to provide medical care while limiting exposure to the novel coronavirus.    Consent was obtained for video visit:  Yes.   Answered questions that patient had about telehealth interaction:  Yes.   I discussed the limitations, risks, security and privacy concerns of performing an evaluation and management service by telemedicine. I also discussed with the patient that there may be a patient responsible charge related to this service. The patient expressed understanding and agreed to proceed.  Pt location: Home Physician Location: office Name of referring provider:  Kelton Pillar, MD I connected with Alvia Grove at patients initiation/request on 08/25/2019 at 11:00 AM EST by video enabled telemedicine application and verified that I am speaking with the correct person using two identifiers. Pt MRN:  [id number] Pt DOB:  03-02-1926 Video Participants:  Alvia Grove;  Suszanne Conners (daughter)   History of Present Illness: The patient was seen as a virtual video visit on 08/25/2019. She was last seen 6 months ago for dementia. Her daughter Joelene Millin is present during the e-visit to provide additional information. MMSE 23/30 in June 2020. She is on Donepezil 10mg  daily. On her last visit, she was reporting irritability and sleep difficulties and was started on mirtazapine. She took 1 tablet and slept for almost 24 hours, the next night they gave her 1/2 tablet and she was still very drugged up the next day. In the past she was on Trazodone for depression but it was stopped because she was so drugged up in the morning and almost falling. She states she is not doing well because her kidney disease is driving her crazy. Her arthritis is so bad she can hardly get up and down. She has had some spells of vertigo, no falls. She states her memory is not very good. She lives with her husband, her daughter lives next door and helps with her  medications. Joelene Millin made a chart of her medications, she matches the pills and does well taking medications regularly. Her daughter manages finances. She does not drive. She is able to bathe and dress independently, no hygiene concerns. She has been pretty grouchy, states her mood is not too good. Joelene Millin has not noticed much personality changes, mostly frustration with her husband. She reports that she dreams things and 2-3 times thought someone was standing at the window looking in. She denies any headaches, dizziness, vision changes, focal numbness/tingling/weakness.   The patient was last seen more than a year ago for mild dementia. Her son-in-law is present during the e-visit to provide additional information. MMSE 21/30 in February 2019. She was started on Donepezil 10mg  daily, which she has been taking without side effects. She feels her memory is okay. Her son-in-law reports that there has been a time or two where she has had an episode where she has more trouble than other days. Sometimes she would be slower all day, seemingly correlating with her hydration levels. Her daughter fixes her medications and comes daily to give the medications for that day. Daughter manages finances. She does not drive. She is independent with dressing and bathing. She admits to increased irritability, she gets so irritated sometimes "I can't stand myself." She states she always had a problem with her nerves and used to take medication that was stopped after she had a rash, she cannot recall the medication but it did help with mood. She admits to hallucinations, she has had 2 episodes  where she woke up at night and saw people standing at her window. Last episode was last month. No paranoia. She has sleep difficulties and sometimes takes hydrocodone when she tosses and turns at night. She has frequent headaches with neck pain and arthritis, she had one this morning. No nausea/vomiting, she rarely takes medication for  headaches. No dizziness, vision changes, no falls.   History on Initial Assessment 10/23/2017: This is a pleasant 85 year old right-handed woman with a history of hypertension, hyperlipidemia, chronic kidney disease, presenting for evaluation of memory changes and confusion. Her daughter reports that she had a significant change in mental status last November 2018, "like a flipped switch." She had been having minor forgetfulness prior to this, such as repeating herself, but last November 2018 when she came to their home, the patient did not recognize her daughter or where she was. She thought her daughter was her childhood friend. She thought she was talking to her father and that her father would be there when they got home. She recalls going to her PCP and feeling confused. She had bloodwork, urinalysis, and head CT which were unremarkable. Her daughter was instructed to keep her off hydrocodone and Benadryl. Her daughter reports that she gradually improved, and was 90-95% back to normal within 4 weeks. She was almost completely clear cognitively when she was brought to [facility] on 08/15/17 for shortness of breath and left axilla pain and was found to have pulmonary embolism and DVT. She is now on Eliquis. She reports her memory can be bad sometimes, other times it's pretty good. She sometimes thinks the house is in the wrong place. Her daughter reports sleep has never been good, but is better with 1/2 a pain pill and Benadryl. She lives with her husband, her daughter comes daily to administer her medications. She has stopped driving since November. At that time, she could not find a familiar store. Her daughter has been in charge of bills since she had C.diff infection 3 years ago and was missing payments. She frequently misplaces things. She has left the stove on and burned food. Her daughter denies any paranoia. There is no family history of dementia. She fell getting out of the car a few weeks ago, no significant  head injuries. No alcohol use.   She has infrequent mild headaches. She has back pain and constipation. She gets shaky when nervous. She denies any dizziness, diplopia, dysarthria/dysphagia, neck pain, focal numbness/tingling/weakness, bladder dysfunction, anosmia.     Current Outpatient Medications on File Prior to Visit  Medication Sig Dispense Refill  . amLODipine (NORVASC) 5 MG tablet Take 0.5 tablets (2.5 mg total) by mouth daily. (Patient taking differently: Take 5 mg by mouth daily. )    . apixaban (ELIQUIS) 5 MG TABS tablet 2 tablets orally twice a day for six days then one tablet twice a day afterwards 72 tablet 0  . cholecalciferol (VITAMIN D) 1000 UNITS tablet Take 1,000 Units by mouth daily.    [name] donepezil (ARICEPT) 10 MG tablet Take 1 tablet by mouth daily 90 tablet 3  . HYDROcodone-acetaminophen (NORCO/VICODIN) 5-325 MG tablet TAKE 1 TABLET BY MOUTH AT BEDTIME AS NEEDED FOR BACK PAIN    . omeprazole (PRILOSEC) 20 MG capsule Take 20 mg by mouth daily.     No current facility-administered medications on file prior to visit.     Observations/Objective:   Vitals:   08/24/19 1555  Weight: 112 lb (50.8 kg)  Height: 5\' 3"  (1.6 m)  GEN:  The patient appears stated age and is in NAD.  Neurological examination: Patient is awake, alert, oriented x 2. No aphasia or dysarthria. Intact fluency and comprehension. Remote and recent memory impaired. SLUMS 15/30. Cranial nerves: Extraocular movements intact with no nystagmus. No facial asymmetry. Motor: moves all extremities symmetrically, at least anti-gravity x 4. No incoordination on finger to nose testing. Gait: slightly hunched, narrow-based with good strides, no ataxia.  [place] Mental Exam 08/25/2019  Weekday Correct 1  Current year 0  What state are we in? 1  Amount spent 1  Amount left 2  # of Animals 1  5 objects recall 0  Number series 1  Hour markers 2  Time correct 0  Placed X in triangle correctly 1    Largest Figure 1  Name of female 2  Date back to work 0  Type of work 2  State she lived in 0  Total score 15    [place] Exam 02/22/2019 10/23/2017  Orientation to time 0 1  Orientation to Place 5 4  Registration 3 3  Attention/ Calculation 5 5  Recall 2 0  Language- name 2 objects 2 2  Language- repeat 1 1  Language- follow 3 step command 3 3  Language- read & follow direction 1 1  Write a sentence 1 1  Copy design 0 0  Total score 23 21    Assessment and Plan:   This is a pleasant 85 yo RH woman with a history of hypertension, hyperlipidemia, CKD, with mild dementia. SLUMS score today 15/30 (MMSE 23/30 in June 2020, 21/30 in February 2019). Continue Donepezil 10mg  daily, she will try taking this in the morning and see if it helps with the vivid dreams. We discussed mood issues that occur with dementia, she has been sensitive to medications in the past, continue to monitor for now. Continue close supervision at home. She does not drive. She will follow-up in 6 months and knows to call for any changes.    Follow Up Instructions:   -I discussed the assessment and treatment plan with the patient/daughter. The patient/daughter were provided an opportunity to ask questions and all were answered. The patient/daughter agreed with the plan and demonstrated an understanding of the instructions.   The patient was advised to call back or seek an in-person evaluation if the symptoms worsen or if the condition fails to improve as anticipated.     Cameron Sprang, MD

## 2019-11-01 DIAGNOSIS — H43813 Vitreous degeneration, bilateral: Secondary | ICD-10-CM | POA: Diagnosis not present

## 2019-11-07 HISTORY — PX: HIP FRACTURE SURGERY: SHX118

## 2019-11-14 DIAGNOSIS — M5137 Other intervertebral disc degeneration, lumbosacral region: Secondary | ICD-10-CM | POA: Diagnosis not present

## 2019-11-14 DIAGNOSIS — G4700 Insomnia, unspecified: Secondary | ICD-10-CM | POA: Diagnosis not present

## 2019-11-14 DIAGNOSIS — R8290 Unspecified abnormal findings in urine: Secondary | ICD-10-CM | POA: Diagnosis not present

## 2019-11-14 DIAGNOSIS — R42 Dizziness and giddiness: Secondary | ICD-10-CM | POA: Diagnosis not present

## 2019-11-14 DIAGNOSIS — E46 Unspecified protein-calorie malnutrition: Secondary | ICD-10-CM | POA: Diagnosis not present

## 2019-11-14 DIAGNOSIS — R350 Frequency of micturition: Secondary | ICD-10-CM | POA: Diagnosis not present

## 2019-11-26 ENCOUNTER — Emergency Department (HOSPITAL_COMMUNITY): Payer: PPO

## 2019-11-26 ENCOUNTER — Encounter (HOSPITAL_COMMUNITY): Payer: Self-pay

## 2019-11-26 ENCOUNTER — Other Ambulatory Visit: Payer: Self-pay

## 2019-11-26 ENCOUNTER — Inpatient Hospital Stay (HOSPITAL_COMMUNITY)
Admission: EM | Admit: 2019-11-26 | Discharge: 2019-11-30 | DRG: 481 | Disposition: A | Discharge status: Skilled Nursing Facility | Payer: PPO | Attending: Internal Medicine | Admitting: Internal Medicine

## 2019-11-26 ENCOUNTER — Inpatient Hospital Stay (HOSPITAL_COMMUNITY): Payer: PPO

## 2019-11-26 ENCOUNTER — Inpatient Hospital Stay (HOSPITAL_COMMUNITY): Payer: PPO | Admitting: Certified Registered"

## 2019-11-26 ENCOUNTER — Encounter (HOSPITAL_COMMUNITY)
Admission: EM | Disposition: A | Discharge status: Skilled Nursing Facility | Payer: Self-pay | Source: Home / Self Care | Attending: Internal Medicine

## 2019-11-26 DIAGNOSIS — Z86711 Personal history of pulmonary embolism: Secondary | ICD-10-CM | POA: Diagnosis not present

## 2019-11-26 DIAGNOSIS — R410 Disorientation, unspecified: Secondary | ICD-10-CM | POA: Diagnosis not present

## 2019-11-26 DIAGNOSIS — Z20822 Contact with and (suspected) exposure to covid-19: Secondary | ICD-10-CM | POA: Diagnosis present

## 2019-11-26 DIAGNOSIS — K219 Gastro-esophageal reflux disease without esophagitis: Secondary | ICD-10-CM | POA: Diagnosis present

## 2019-11-26 DIAGNOSIS — S72009A Fracture of unspecified part of neck of unspecified femur, initial encounter for closed fracture: Secondary | ICD-10-CM | POA: Diagnosis present

## 2019-11-26 DIAGNOSIS — I129 Hypertensive chronic kidney disease with stage 1 through stage 4 chronic kidney disease, or unspecified chronic kidney disease: Secondary | ICD-10-CM | POA: Diagnosis present

## 2019-11-26 DIAGNOSIS — S7291XD Unspecified fracture of right femur, subsequent encounter for closed fracture with routine healing: Secondary | ICD-10-CM | POA: Diagnosis not present

## 2019-11-26 DIAGNOSIS — Z7901 Long term (current) use of anticoagulants: Secondary | ICD-10-CM | POA: Diagnosis not present

## 2019-11-26 DIAGNOSIS — R278 Other lack of coordination: Secondary | ICD-10-CM | POA: Diagnosis not present

## 2019-11-26 DIAGNOSIS — E785 Hyperlipidemia, unspecified: Secondary | ICD-10-CM | POA: Diagnosis not present

## 2019-11-26 DIAGNOSIS — Z681 Body mass index (BMI) 19 or less, adult: Secondary | ICD-10-CM | POA: Diagnosis not present

## 2019-11-26 DIAGNOSIS — Z882 Allergy status to sulfonamides status: Secondary | ICD-10-CM | POA: Diagnosis not present

## 2019-11-26 DIAGNOSIS — S72001A Fracture of unspecified part of neck of right femur, initial encounter for closed fracture: Secondary | ICD-10-CM

## 2019-11-26 DIAGNOSIS — R110 Nausea: Secondary | ICD-10-CM | POA: Diagnosis not present

## 2019-11-26 DIAGNOSIS — Z86718 Personal history of other venous thrombosis and embolism: Secondary | ICD-10-CM

## 2019-11-26 DIAGNOSIS — G8929 Other chronic pain: Secondary | ICD-10-CM | POA: Diagnosis not present

## 2019-11-26 DIAGNOSIS — F0150 Vascular dementia without behavioral disturbance: Secondary | ICD-10-CM | POA: Diagnosis not present

## 2019-11-26 DIAGNOSIS — N183 Chronic kidney disease, stage 3 unspecified: Secondary | ICD-10-CM | POA: Diagnosis not present

## 2019-11-26 DIAGNOSIS — F0390 Unspecified dementia without behavioral disturbance: Secondary | ICD-10-CM | POA: Diagnosis present

## 2019-11-26 DIAGNOSIS — R630 Anorexia: Secondary | ICD-10-CM | POA: Diagnosis present

## 2019-11-26 DIAGNOSIS — S72141A Displaced intertrochanteric fracture of right femur, initial encounter for closed fracture: Principal | ICD-10-CM | POA: Diagnosis present

## 2019-11-26 DIAGNOSIS — R52 Pain, unspecified: Secondary | ICD-10-CM | POA: Diagnosis not present

## 2019-11-26 DIAGNOSIS — R4189 Other symptoms and signs involving cognitive functions and awareness: Secondary | ICD-10-CM | POA: Diagnosis not present

## 2019-11-26 DIAGNOSIS — M542 Cervicalgia: Secondary | ICD-10-CM | POA: Diagnosis not present

## 2019-11-26 DIAGNOSIS — W19XXXD Unspecified fall, subsequent encounter: Secondary | ICD-10-CM | POA: Diagnosis not present

## 2019-11-26 DIAGNOSIS — M6281 Muscle weakness (generalized): Secondary | ICD-10-CM | POA: Diagnosis not present

## 2019-11-26 DIAGNOSIS — W19XXXA Unspecified fall, initial encounter: Secondary | ICD-10-CM | POA: Diagnosis not present

## 2019-11-26 DIAGNOSIS — N1831 Chronic kidney disease, stage 3a: Secondary | ICD-10-CM

## 2019-11-26 DIAGNOSIS — K5900 Constipation, unspecified: Secondary | ICD-10-CM | POA: Diagnosis not present

## 2019-11-26 DIAGNOSIS — N390 Urinary tract infection, site not specified: Secondary | ICD-10-CM | POA: Diagnosis not present

## 2019-11-26 DIAGNOSIS — F0391 Unspecified dementia with behavioral disturbance: Secondary | ICD-10-CM | POA: Diagnosis not present

## 2019-11-26 DIAGNOSIS — E559 Vitamin D deficiency, unspecified: Secondary | ICD-10-CM | POA: Diagnosis not present

## 2019-11-26 DIAGNOSIS — R519 Headache, unspecified: Secondary | ICD-10-CM | POA: Diagnosis not present

## 2019-11-26 DIAGNOSIS — Z888 Allergy status to other drugs, medicaments and biological substances status: Secondary | ICD-10-CM | POA: Diagnosis not present

## 2019-11-26 DIAGNOSIS — E860 Dehydration: Secondary | ICD-10-CM | POA: Diagnosis present

## 2019-11-26 DIAGNOSIS — Z79899 Other long term (current) drug therapy: Secondary | ICD-10-CM

## 2019-11-26 DIAGNOSIS — E8889 Other specified metabolic disorders: Secondary | ICD-10-CM | POA: Diagnosis present

## 2019-11-26 DIAGNOSIS — Z87891 Personal history of nicotine dependence: Secondary | ICD-10-CM | POA: Diagnosis not present

## 2019-11-26 DIAGNOSIS — W1830XA Fall on same level, unspecified, initial encounter: Secondary | ICD-10-CM | POA: Diagnosis present

## 2019-11-26 DIAGNOSIS — Z87440 Personal history of urinary (tract) infections: Secondary | ICD-10-CM

## 2019-11-26 DIAGNOSIS — R262 Difficulty in walking, not elsewhere classified: Secondary | ICD-10-CM | POA: Diagnosis not present

## 2019-11-26 DIAGNOSIS — Y92009 Unspecified place in unspecified non-institutional (private) residence as the place of occurrence of the external cause: Secondary | ICD-10-CM

## 2019-11-26 DIAGNOSIS — Z885 Allergy status to narcotic agent status: Secondary | ICD-10-CM

## 2019-11-26 DIAGNOSIS — R0902 Hypoxemia: Secondary | ICD-10-CM | POA: Diagnosis not present

## 2019-11-26 DIAGNOSIS — R079 Chest pain, unspecified: Secondary | ICD-10-CM | POA: Diagnosis not present

## 2019-11-26 DIAGNOSIS — I10 Essential (primary) hypertension: Secondary | ICD-10-CM | POA: Diagnosis not present

## 2019-11-26 DIAGNOSIS — S199XXA Unspecified injury of neck, initial encounter: Secondary | ICD-10-CM | POA: Diagnosis not present

## 2019-11-26 DIAGNOSIS — S72141D Displaced intertrochanteric fracture of right femur, subsequent encounter for closed fracture with routine healing: Secondary | ICD-10-CM | POA: Diagnosis not present

## 2019-11-26 DIAGNOSIS — D72829 Elevated white blood cell count, unspecified: Secondary | ICD-10-CM | POA: Diagnosis not present

## 2019-11-26 DIAGNOSIS — Z743 Need for continuous supervision: Secondary | ICD-10-CM | POA: Diagnosis not present

## 2019-11-26 DIAGNOSIS — R000 Tachycardia, unspecified: Secondary | ICD-10-CM | POA: Diagnosis not present

## 2019-11-26 DIAGNOSIS — M545 Low back pain: Secondary | ICD-10-CM | POA: Diagnosis not present

## 2019-11-26 DIAGNOSIS — S7291XA Unspecified fracture of right femur, initial encounter for closed fracture: Secondary | ICD-10-CM

## 2019-11-26 DIAGNOSIS — N189 Chronic kidney disease, unspecified: Secondary | ICD-10-CM | POA: Insufficient documentation

## 2019-11-26 DIAGNOSIS — Z03818 Encounter for observation for suspected exposure to other biological agents ruled out: Secondary | ICD-10-CM | POA: Diagnosis not present

## 2019-11-26 DIAGNOSIS — R279 Unspecified lack of coordination: Secondary | ICD-10-CM | POA: Diagnosis not present

## 2019-11-26 DIAGNOSIS — E440 Moderate protein-calorie malnutrition: Secondary | ICD-10-CM | POA: Diagnosis not present

## 2019-11-26 DIAGNOSIS — R2681 Unsteadiness on feet: Secondary | ICD-10-CM | POA: Diagnosis not present

## 2019-11-26 DIAGNOSIS — Z741 Need for assistance with personal care: Secondary | ICD-10-CM | POA: Diagnosis not present

## 2019-11-26 DIAGNOSIS — Z4789 Encounter for other orthopedic aftercare: Secondary | ICD-10-CM | POA: Diagnosis not present

## 2019-11-26 DIAGNOSIS — D62 Acute posthemorrhagic anemia: Secondary | ICD-10-CM | POA: Diagnosis not present

## 2019-11-26 HISTORY — DX: Long term (current) use of anticoagulants: Z79.01

## 2019-11-26 HISTORY — PX: INTRAMEDULLARY (IM) NAIL INTERTROCHANTERIC: SHX5875

## 2019-11-26 LAB — URINALYSIS, ROUTINE W REFLEX MICROSCOPIC
Bilirubin Urine: NEGATIVE
Glucose, UA: NEGATIVE mg/dL
Hgb urine dipstick: NEGATIVE
Ketones, ur: NEGATIVE mg/dL
Leukocytes,Ua: NEGATIVE
Nitrite: NEGATIVE
Protein, ur: NEGATIVE mg/dL
Specific Gravity, Urine: 1.012 (ref 1.005–1.030)
pH: 8 (ref 5.0–8.0)

## 2019-11-26 LAB — CBC WITH DIFFERENTIAL/PLATELET
Abs Immature Granulocytes: 0.04 10*3/uL (ref 0.00–0.07)
Basophils Absolute: 0 10*3/uL (ref 0.0–0.1)
Basophils Relative: 1 %
Eosinophils Absolute: 0.1 10*3/uL (ref 0.0–0.5)
Eosinophils Relative: 1 %
HCT: 36.9 % (ref 36.0–46.0)
Hemoglobin: 12 g/dL (ref 12.0–15.0)
Immature Granulocytes: 1 %
Lymphocytes Relative: 23 %
Lymphs Abs: 1.8 10*3/uL (ref 0.7–4.0)
MCH: 29.9 pg (ref 26.0–34.0)
MCHC: 32.5 g/dL (ref 30.0–36.0)
MCV: 92 fL (ref 80.0–100.0)
Monocytes Absolute: 0.5 10*3/uL (ref 0.1–1.0)
Monocytes Relative: 7 %
Neutro Abs: 5.4 10*3/uL (ref 1.7–7.7)
Neutrophils Relative %: 67 %
Platelets: 220 10*3/uL (ref 150–400)
RBC: 4.01 MIL/uL (ref 3.87–5.11)
RDW: 13.1 % (ref 11.5–15.5)
WBC: 7.9 10*3/uL (ref 4.0–10.5)
nRBC: 0 % (ref 0.0–0.2)

## 2019-11-26 LAB — RESPIRATORY PANEL BY RT PCR (FLU A&B, COVID)
Influenza A by PCR: NEGATIVE
Influenza B by PCR: NEGATIVE
SARS Coronavirus 2 by RT PCR: NEGATIVE

## 2019-11-26 LAB — SURGICAL PCR SCREEN
MRSA, PCR: NEGATIVE
Staphylococcus aureus: NEGATIVE

## 2019-11-26 LAB — I-STAT CHEM 8, ED
BUN: 26 mg/dL — ABNORMAL HIGH (ref 8–23)
Calcium, Ion: 1.3 mmol/L (ref 1.15–1.40)
Chloride: 105 mmol/L (ref 98–111)
Creatinine, Ser: 1 mg/dL (ref 0.44–1.00)
Glucose, Bld: 114 mg/dL — ABNORMAL HIGH (ref 70–99)
HCT: 35 % — ABNORMAL LOW (ref 36.0–46.0)
Hemoglobin: 11.9 g/dL — ABNORMAL LOW (ref 12.0–15.0)
Potassium: 3.8 mmol/L (ref 3.5–5.1)
Sodium: 139 mmol/L (ref 135–145)
TCO2: 27 mmol/L (ref 22–32)

## 2019-11-26 LAB — ABO/RH: ABO/RH(D): A POS

## 2019-11-26 LAB — PREALBUMIN: Prealbumin: 17.6 mg/dL — ABNORMAL LOW (ref 18–38)

## 2019-11-26 LAB — HEMOGLOBIN AND HEMATOCRIT, BLOOD
HCT: 26.4 % — ABNORMAL LOW (ref 36.0–46.0)
Hemoglobin: 8.6 g/dL — ABNORMAL LOW (ref 12.0–15.0)

## 2019-11-26 SURGERY — FIXATION, FRACTURE, INTERTROCHANTERIC, WITH INTRAMEDULLARY ROD
Anesthesia: General | Laterality: Right

## 2019-11-26 MED ORDER — ACETAMINOPHEN 10 MG/ML IV SOLN
INTRAVENOUS | Status: DC | PRN
  Administered 2019-11-26: 1000 mg via INTRAVENOUS

## 2019-11-26 MED ORDER — SUGAMMADEX SODIUM 200 MG/2ML IV SOLN
INTRAVENOUS | Status: DC | PRN
  Administered 2019-11-26: 200 mg via INTRAVENOUS

## 2019-11-26 MED ORDER — ONDANSETRON HCL 4 MG PO TABS: 4.0000 mg | ORAL_TABLET | Freq: Four times a day (QID) | ORAL | Status: DC | PRN

## 2019-11-26 MED ORDER — CEPHALEXIN 250 MG PO CAPS
250.0000 mg | ORAL_CAPSULE | Freq: Two times a day (BID) | ORAL | Status: AC
  Administered 2019-11-26 (×2): 250 mg via ORAL
  Filled 2019-11-26 (×2): qty 1

## 2019-11-26 MED ORDER — CEFAZOLIN SODIUM-DEXTROSE 2-3 GM-%(50ML) IV SOLR
INTRAVENOUS | Status: DC | PRN
  Administered 2019-11-26: 2 g via INTRAVENOUS

## 2019-11-26 MED ORDER — PHENYLEPHRINE HCL-NACL 10-0.9 MG/250ML-% IV SOLN
INTRAVENOUS | Status: DC | PRN
  Administered 2019-11-26: 25 ug/min via INTRAVENOUS

## 2019-11-26 MED ORDER — METOCLOPRAMIDE HCL 5 MG/ML IJ SOLN: 5.0000 mg | Freq: Three times a day (TID) | INTRAMUSCULAR | Status: DC | PRN

## 2019-11-26 MED ORDER — FENTANYL CITRATE (PF) 100 MCG/2ML IJ SOLN
25.0000 ug | INTRAMUSCULAR | Status: DC | PRN
  Administered 2019-11-26: 25 ug via INTRAVENOUS

## 2019-11-26 MED ORDER — PANTOPRAZOLE SODIUM 40 MG PO TBEC
40.0000 mg | DELAYED_RELEASE_TABLET | Freq: Every day | ORAL | Status: DC
  Administered 2019-11-26 – 2019-11-30 (×5): 40 mg via ORAL
  Filled 2019-11-26 (×5): qty 1

## 2019-11-26 MED ORDER — ONDANSETRON HCL 4 MG/2ML IJ SOLN
INTRAMUSCULAR | Status: DC | PRN
  Administered 2019-11-26: 4 mg via INTRAVENOUS

## 2019-11-26 MED ORDER — HYDROCODONE-ACETAMINOPHEN 5-325 MG PO TABS
1.0000 | ORAL_TABLET | Freq: Four times a day (QID) | ORAL | Status: DC | PRN
  Administered 2019-11-26 – 2019-11-28 (×3): 1 via ORAL
  Filled 2019-11-26 (×4): qty 1

## 2019-11-26 MED ORDER — ESMOLOL HCL 100 MG/10ML IV SOLN
INTRAVENOUS | Status: DC | PRN
  Administered 2019-11-26: 30 mg via INTRAVENOUS

## 2019-11-26 MED ORDER — VITAMIN D 25 MCG (1000 UNIT) PO TABS
2000.0000 [IU] | ORAL_TABLET | Freq: Two times a day (BID) | ORAL | Status: DC
  Administered 2019-11-26 – 2019-11-30 (×8): 2000 [IU] via ORAL
  Filled 2019-11-26 (×8): qty 2

## 2019-11-26 MED ORDER — AMLODIPINE BESYLATE 5 MG PO TABS
5.0000 mg | ORAL_TABLET | Freq: Every day | ORAL | Status: DC
  Administered 2019-11-26 – 2019-11-30 (×5): 5 mg via ORAL
  Filled 2019-11-26 (×5): qty 1

## 2019-11-26 MED ORDER — METOCLOPRAMIDE HCL 5 MG PO TABS: 5.0000 mg | ORAL_TABLET | Freq: Three times a day (TID) | ORAL | Status: DC | PRN

## 2019-11-26 MED ORDER — FENTANYL CITRATE (PF) 250 MCG/5ML IJ SOLN
INTRAMUSCULAR | Status: AC
  Filled 2019-11-26: qty 5

## 2019-11-26 MED ORDER — PHENOL 1.4 % MT LIQD: 1.0000 | OROMUCOSAL | Status: DC | PRN

## 2019-11-26 MED ORDER — DEXAMETHASONE SODIUM PHOSPHATE 10 MG/ML IJ SOLN
INTRAMUSCULAR | Status: DC | PRN
  Administered 2019-11-26: 10 mg via INTRAVENOUS

## 2019-11-26 MED ORDER — EPHEDRINE SULFATE 50 MG/ML IJ SOLN
INTRAMUSCULAR | Status: DC | PRN
  Administered 2019-11-26: 5 mg via INTRAVENOUS

## 2019-11-26 MED ORDER — LACTATED RINGERS IV SOLN: INTRAVENOUS | Status: DC

## 2019-11-26 MED ORDER — MORPHINE SULFATE (PF) 2 MG/ML IV SOLN
0.5000 mg | INTRAVENOUS | Status: DC | PRN
  Administered 2019-11-26 – 2019-11-27 (×4): 0.5 mg via INTRAVENOUS
  Filled 2019-11-26 (×4): qty 1

## 2019-11-26 MED ORDER — DONEPEZIL HCL 10 MG PO TABS
10.0000 mg | ORAL_TABLET | Freq: Every day | ORAL | Status: DC
  Administered 2019-11-26 – 2019-11-30 (×5): 10 mg via ORAL
  Filled 2019-11-26 (×6): qty 1

## 2019-11-26 MED ORDER — CEFAZOLIN SODIUM-DEXTROSE 2-4 GM/100ML-% IV SOLN
INTRAVENOUS | Status: AC
  Filled 2019-11-26: qty 100

## 2019-11-26 MED ORDER — CEFAZOLIN SODIUM-DEXTROSE 2-4 GM/100ML-% IV SOLN
2.0000 g | Freq: Two times a day (BID) | INTRAVENOUS | Status: AC
  Administered 2019-11-27: 2 g via INTRAVENOUS
  Filled 2019-11-26: qty 100

## 2019-11-26 MED ORDER — ASCORBIC ACID 500 MG PO TABS
500.0000 mg | ORAL_TABLET | Freq: Every day | ORAL | Status: DC
  Administered 2019-11-26 – 2019-11-30 (×5): 500 mg via ORAL
  Filled 2019-11-26 (×5): qty 1

## 2019-11-26 MED ORDER — ONDANSETRON HCL 4 MG/2ML IJ SOLN: 4.0000 mg | Freq: Once | INTRAMUSCULAR | Status: DC | PRN

## 2019-11-26 MED ORDER — DIPHENHYDRAMINE HCL 50 MG/ML IJ SOLN: 12.5000 mg | Freq: Four times a day (QID) | INTRAMUSCULAR | Status: DC | PRN

## 2019-11-26 MED ORDER — ESMOLOL HCL 100 MG/10ML IV SOLN
INTRAVENOUS | Status: AC
  Filled 2019-11-26: qty 10

## 2019-11-26 MED ORDER — SENNA 8.6 MG PO TABS
1.0000 | ORAL_TABLET | Freq: Two times a day (BID) | ORAL | Status: DC
  Administered 2019-11-26 – 2019-11-30 (×8): 8.6 mg via ORAL
  Filled 2019-11-26 (×8): qty 1

## 2019-11-26 MED ORDER — ONDANSETRON HCL 4 MG/2ML IJ SOLN
INTRAMUSCULAR | Status: AC
  Filled 2019-11-26: qty 2

## 2019-11-26 MED ORDER — RISAQUAD PO CAPS
1.0000 | ORAL_CAPSULE | Freq: Every day | ORAL | Status: DC
  Administered 2019-11-27 – 2019-11-30 (×4): 1 via ORAL
  Filled 2019-11-26 (×4): qty 1

## 2019-11-26 MED ORDER — LIDOCAINE 2% (20 MG/ML) 5 ML SYRINGE
INTRAMUSCULAR | Status: DC | PRN
  Administered 2019-11-26: 60 mg via INTRAVENOUS

## 2019-11-26 MED ORDER — ALBUMIN HUMAN 5 % IV SOLN: INTRAVENOUS | Status: DC | PRN

## 2019-11-26 MED ORDER — DOCUSATE SODIUM 100 MG PO CAPS
100.0000 mg | ORAL_CAPSULE | Freq: Two times a day (BID) | ORAL | Status: DC
  Administered 2019-11-26 – 2019-11-30 (×8): 100 mg via ORAL
  Filled 2019-11-26 (×8): qty 1

## 2019-11-26 MED ORDER — MENTHOL 3 MG MT LOZG
1.0000 | LOZENGE | OROMUCOSAL | Status: DC | PRN
  Administered 2019-11-27: 3 mg via ORAL
  Filled 2019-11-26: qty 9

## 2019-11-26 MED ORDER — ONDANSETRON HCL 4 MG/2ML IJ SOLN
4.0000 mg | Freq: Four times a day (QID) | INTRAMUSCULAR | Status: DC | PRN
  Administered 2019-11-26: 4 mg via INTRAVENOUS
  Filled 2019-11-26: qty 2

## 2019-11-26 MED ORDER — SODIUM CHLORIDE 0.9 % IV SOLN: Freq: Once | INTRAVENOUS | Status: AC

## 2019-11-26 MED ORDER — PROPOFOL 10 MG/ML IV BOLUS
INTRAVENOUS | Status: DC | PRN
  Administered 2019-11-26: 100 mg via INTRAVENOUS

## 2019-11-26 MED ORDER — ROCURONIUM BROMIDE 10 MG/ML (PF) SYRINGE
PREFILLED_SYRINGE | INTRAVENOUS | Status: DC | PRN
  Administered 2019-11-26: 20 mg via INTRAVENOUS
  Administered 2019-11-26: 30 mg via INTRAVENOUS

## 2019-11-26 MED ORDER — PHENYLEPHRINE HCL (PRESSORS) 10 MG/ML IV SOLN
INTRAVENOUS | Status: DC | PRN
  Administered 2019-11-26: 40 ug via INTRAVENOUS
  Administered 2019-11-26: 80 ug via INTRAVENOUS
  Administered 2019-11-26: 40 ug via INTRAVENOUS
  Administered 2019-11-26: 80 ug via INTRAVENOUS
  Administered 2019-11-26: 40 ug via INTRAVENOUS
  Administered 2019-11-26: 120 ug via INTRAVENOUS

## 2019-11-26 MED ORDER — ACETAMINOPHEN 500 MG PO TABS
500.0000 mg | ORAL_TABLET | Freq: Three times a day (TID) | ORAL | Status: DC
  Administered 2019-11-26 – 2019-11-30 (×10): 500 mg via ORAL
  Filled 2019-11-26 (×10): qty 1

## 2019-11-26 MED ORDER — FENTANYL CITRATE (PF) 100 MCG/2ML IJ SOLN
50.0000 ug | Freq: Once | INTRAMUSCULAR | Status: AC
  Administered 2019-11-26: 50 ug via INTRAVENOUS
  Filled 2019-11-26: qty 2

## 2019-11-26 MED ORDER — APIXABAN 5 MG PO TABS
5.0000 mg | ORAL_TABLET | Freq: Two times a day (BID) | ORAL | Status: DC
  Administered 2019-11-26 – 2019-11-27 (×2): 5 mg via ORAL
  Filled 2019-11-26 (×2): qty 1

## 2019-11-26 MED ORDER — FENTANYL CITRATE (PF) 100 MCG/2ML IJ SOLN
INTRAMUSCULAR | Status: AC
  Filled 2019-11-26: qty 2

## 2019-11-26 MED ORDER — FENTANYL CITRATE (PF) 250 MCG/5ML IJ SOLN
INTRAMUSCULAR | Status: DC | PRN
  Administered 2019-11-26 (×3): 50 ug via INTRAVENOUS
  Administered 2019-11-26: 100 ug via INTRAVENOUS

## 2019-11-26 MED ORDER — PROPOFOL 10 MG/ML IV BOLUS
INTRAVENOUS | Status: AC
  Filled 2019-11-26: qty 20

## 2019-11-26 MED ORDER — 0.9 % SODIUM CHLORIDE (POUR BTL) OPTIME
TOPICAL | Status: DC | PRN
  Administered 2019-11-26: 16:00:00 1000 mL

## 2019-11-26 SURGICAL SUPPLY — 44 items
BIT DRILL 4.3MMS DISTAL GRDTED (BIT) IMPLANT
BNDG COHESIVE 6X5 TAN STRL LF (GAUZE/BANDAGES/DRESSINGS) ×2 IMPLANT
BRUSH SCRUB EZ PLAIN DRY (MISCELLANEOUS) ×6 IMPLANT
COVER PERINEAL POST (MISCELLANEOUS) ×3 IMPLANT
COVER SURGICAL LIGHT HANDLE (MISCELLANEOUS) ×4 IMPLANT
DRAPE C-ARMOR (DRAPES) ×3 IMPLANT
DRAPE STERI IOBAN 125X83 (DRAPES) ×3 IMPLANT
DRILL 4.3MMS DISTAL GRADUATED (BIT) ×3
DRSG EMULSION OIL 3X3 NADH (GAUZE/BANDAGES/DRESSINGS) ×1 IMPLANT
DRSG MEPILEX BORDER 4X4 (GAUZE/BANDAGES/DRESSINGS) ×7 IMPLANT
DRSG MEPILEX BORDER 4X8 (GAUZE/BANDAGES/DRESSINGS) ×1 IMPLANT
ELECT REM PT RETURN 9FT ADLT (ELECTROSURGICAL) ×3
ELECTRODE REM PT RTRN 9FT ADLT (ELECTROSURGICAL) ×1 IMPLANT
GLOVE BIO SURGEON STRL SZ8 (GLOVE) ×3 IMPLANT
GLOVE BIOGEL PI IND STRL 8 (GLOVE) ×1 IMPLANT
GLOVE BIOGEL PI INDICATOR 8 (GLOVE) ×2
GOWN STRL REUS W/ TWL LRG LVL3 (GOWN DISPOSABLE) ×2 IMPLANT
GOWN STRL REUS W/ TWL XL LVL3 (GOWN DISPOSABLE) ×1 IMPLANT
GOWN STRL REUS W/TWL LRG LVL3 (GOWN DISPOSABLE) ×6
GOWN STRL REUS W/TWL XL LVL3 (GOWN DISPOSABLE) ×3
GUIDEPIN 3.2X17.5 THRD DISP (PIN) ×2 IMPLANT
GUIDEWIRE BALL NOSE 80CM (WIRE) ×2 IMPLANT
HIP FRA NAIL LAG SCREW 10.5X90 (Orthopedic Implant) ×3 IMPLANT
KIT BASIN OR (CUSTOM PROCEDURE TRAY) ×3 IMPLANT
KIT TURNOVER KIT B (KITS) ×3 IMPLANT
NAIL HIP FRACTURE 11X380MM (Nail) ×2 IMPLANT
NS IRRIG 1000ML POUR BTL (IV SOLUTION) ×3 IMPLANT
PACK GENERAL/GYN (CUSTOM PROCEDURE TRAY) ×3 IMPLANT
PAD ARMBOARD 7.5X6 YLW CONV (MISCELLANEOUS) ×6 IMPLANT
SCREW BONE CORTICAL 5.0X38 (Screw) ×2 IMPLANT
SCREW BONE CORTICAL 5.0X40 (Screw) ×2 IMPLANT
SCREW BONE CORTICAL 5.0X42 (Screw) ×2 IMPLANT
SCREW LAG HIP FRA NAIL 10.5X90 (Orthopedic Implant) IMPLANT
STAPLER VISISTAT 35W (STAPLE) ×3 IMPLANT
SUT ETHILON 3 0 PS 1 (SUTURE) ×3 IMPLANT
SUT VIC AB 0 CT1 27 (SUTURE) ×3
SUT VIC AB 0 CT1 27XBRD ANBCTR (SUTURE) ×1 IMPLANT
SUT VIC AB 1 CT1 27 (SUTURE) ×3
SUT VIC AB 1 CT1 27XBRD ANBCTR (SUTURE) ×1 IMPLANT
SUT VIC AB 2-0 CT1 27 (SUTURE) ×6
SUT VIC AB 2-0 CT1 TAPERPNT 27 (SUTURE) ×1 IMPLANT
TOWEL GREEN STERILE (TOWEL DISPOSABLE) ×6 IMPLANT
TOWEL GREEN STERILE FF (TOWEL DISPOSABLE) ×3 IMPLANT
WATER STERILE IRR 1000ML POUR (IV SOLUTION) ×3 IMPLANT

## 2019-11-26 NOTE — Op Note (Signed)
11/26/2019  4:49 PM  PATIENT:  Susan Banks  86 y.o. female  PRE-OPERATIVE DIAGNOSIS:  CLOSED RIGHT HIP INTERTROCHANTERIC HIP FRACTURE  POST-OPERATIVE DIAGNOSIS:  CLOSED RIGHT HIP INTERTROCHANTERIC HIP FRACTURE  PROCEDURE:  Procedure(s): INTRAMEDULLARY (IM) NAIL INTERTROCHANTRIC (Right) WITH BIOMET AFFIXUS 11 X 380 statically locked  SURGEON:  Surgeon(s) and Role:    Altamese [place], MD - Primary  PHYSICIAN ASSISTANT: PA Student  ANESTHESIA:   general  I/O:  Total I/O In: 250 [IV Piggyback:250] Out: 400 [Urine:100; Blood:300]  SPECIMEN:  No Specimen  TOURNIQUET:  * No tourniquets in log *  COMPLICATIONS: NONE  DICTATION: .Note written in EPIC  DISPOSITION: TO PACU  CONDITION: STABLE  DELAY START OF DVT PROPHYLAXIS BECAUSE OF BLEEDING RISK: NO  BRIEF SUMMARY AND INDICATION OF PROCEDURE:  Susan Banks is a 86 y.o. year- old with multiple medical problems.  I discussed with the patient and daughter risks and benefits of surgical treatment including the potential for malunion, nonunion, symptomatic hardware, heart attack, stroke, neurovascular injury, bleeding, and others.  After full discussion, the patient and family wished to proceed.  BRIEF SUMMARY OF PROCEDURE:  The patient was taken to the operating room where general anesthesia was induced.  She was positioned supine on the Hana fracture table.  A closed reduction maneuver was performed of the fractured proximal femur and this was confirmed on both AP and lateral xray views. A thorough scrub and wash with chlorhexidine and then Betadine scrub and paint was performed.  After sterile drapes and time-out, a long instrument was used to identify the appropriate starting position under C-arm on both AP and lateral images.  A 3 cm incision was made proximal to the greater trochanter.  The curved cannulated awl was inserted just medial to the tip of the lateral trochanter and then the starting guidewire advanced into  the proximal femur.  This was checked on AP and lateral views.  The starting reamer was engaged with the soft tissue protected by a sleeve.  The curved ball-tipped guidewire was then inserted, making sure it was just posterior as possible in the distal femur and across the fracture site, which stayed in a reduced position.  It was sequentially reamed up to 13 mmand an 11 x 380 mm nail inserted to the appropriate depth.  The guidewire for the lag screw was then inserted with the appropriate anteversion to make sure it was in a center-center position.  This was measured and the lag screw placed with excellent purchase and position checked on both views.  The antirotation screw was then engaged within the groove of the lag screw, which was allowed to telescope.  Traction was released and compression achieved with the screw.  This was followed by placement of one distal locking screw using perfect circle technique.  This was confirmed on AP and lateral images. Wounds were irrigated thoroughly, closed in a standard layered fashion. Sterile gently compressive dressings were applied.  A PA student assisted throughout.  The patient was awakened from anesthesia and transported to the PACU in stable condition.  PROGNOSIS:  The patient will be weightbearing as tolerated with physical therapy beginning DVT prophylaxis as soon as deemed stable by the Primary Care Service.  She has no range of motion precautions.  We will continue to follow through at the hospital.  Anticipate follow up in the office in 2 weeks for removal of sutures and further evaluation. Resume Eliquis tomorrow. Mechanical prophylaxis now and early mobilization.  Susan Banks. Marcelino Scot, M.D.

## 2019-11-26 NOTE — Progress Notes (Signed)
PHARMACY NOTE:  ANTIMICROBIAL RENAL DOSAGE ADJUSTMENT  Current antimicrobial regimen includes a mismatch between antimicrobial dosage and estimated renal function.  As per policy approved by the Pharmacy & Therapeutics and Medical Executive Committees, the antimicrobial dosage will be adjusted accordingly.  Current antimicrobial dosage:  Cefazolin 2g Q6hr  Indication: surgical prophylaxis  Renal Function:  Estimated Creatinine Clearance: 26.7 mL/min (by C-G formula based on SCr of 1 mg/dL).    Antimicrobial dosage has been changed to:  Cefazolin 2g Q12hr    Benetta Spar, PharmD, BCPS, [facility] Clinical Pharmacist  Please check AMION for all [place] phone numbers After 10:00 PM, call [place] [phone]

## 2019-11-26 NOTE — ED Provider Notes (Addendum)
[facility] EMERGENCY DEPARTMENT Provider Note   CSN: [id number] Arrival date & time: 11/26/19  0346     History Chief Complaint  Patient presents with  . Fall    Susan Banks is a 86 y.o. female.  The history is provided by the patient and the EMS personnel. The history is limited by the condition of the patient (dementia).  Fall This is a new problem. The current episode started 1 to 2 hours ago. The problem occurs constantly. The problem has not changed since onset.Pertinent negatives include no chest pain, no abdominal pain, no headaches and no shortness of breath. Nothing aggravates the symptoms. Nothing relieves the symptoms. She has tried nothing for the symptoms. The treatment provided no relief.  Hip Pain This is a new problem. The current episode started 1 to 2 hours ago. The problem occurs constantly. The problem has not changed since onset.Pertinent negatives include no chest pain, no abdominal pain, no headaches and no shortness of breath. Nothing aggravates the symptoms. Nothing relieves the symptoms. She has tried nothing for the symptoms. The treatment provided no relief.       Past Medical History:  Diagnosis Date  . C. difficile diarrhea   . Chronic kidney disease   . Constipation   . GERD (gastroesophageal reflux disease)   . Hypercholesteremia   . Hypertension   . Shortness of breath     Patient Active Problem List   Diagnosis Date Noted  . Pulmonary embolus ([place]) 08/15/2017  . Hyperlipidemia 06/14/2015  . Fever 06/14/2015  . Altered mental status 06/13/2015  . Hyponatremia 05/22/2015  . Normocytic anemia 05/22/2015  . GERD (gastroesophageal reflux disease) 05/22/2015  . Diarrhea 05/20/2015  . HTN (hypertension) 05/20/2015  . Chronic kidney disease, stage 3 05/20/2015  . C. difficile colitis 05/20/2015  . Proctocolitis     Past Surgical History:  Procedure Laterality Date  . ABDOMINAL SURGERY    .  ESOPHAGOGASTRODUODENOSCOPY N/A 07/04/2014   Procedure: ESOPHAGOGASTRODUODENOSCOPY (EGD);  Surgeon: Laverda Page, MD;  Location: [facility];  Service: Cardiovascular;  Laterality: N/A;  . TEE WITHOUT CARDIOVERSION N/A 06/13/2014   Procedure: TRANSESOPHAGEAL ECHOCARDIOGRAM (TEE);  Surgeon: Laverda Page, MD;  Location: [facility];  Service: Cardiovascular;  Laterality: N/A;     OB History   No obstetric history on file.     History reviewed. No pertinent family history.  Social History   Tobacco Use  . Smoking status: Former Smoker    Quit date: 06/14/1983    Years since quitting: 36.4  . Smokeless tobacco: Never Used  Substance Use Topics  . Alcohol use: No  . Drug use: No    Home Medications Prior to Admission medications   Medication Sig Start Date End Date Taking? Authorizing Provider  amLODipine (NORVASC) 5 MG tablet Take 0.5 tablets (2.5 mg total) by mouth daily. Patient taking differently: Take 5 mg by mouth daily.  08/16/17   Loletha Grayer, MD  apixaban (ELIQUIS) 5 MG TABS tablet 2 tablets orally twice a day for six days then one tablet twice a day afterwards 08/16/17   Loletha Grayer, MD  cholecalciferol (VITAMIN D) 1000 UNITS tablet Take 1,000 Units by mouth daily.    [provider]  donepezil (ARICEPT) 10 MG tablet Take 1 tablet (10 mg total) by mouth daily. 08/25/19   Cameron Sprang, MD  HYDROcodone-acetaminophen (NORCO/VICODIN) 5-325 MG tablet TAKE 1 TABLET BY MOUTH AT BEDTIME AS NEEDED FOR BACK PAIN 02/08/19   [provider]  omeprazole (PRILOSEC) 20 MG capsule Take 20 mg by mouth daily.    [provider]    Allergies    Codeine, Sulfa antibiotics, Tramadol hcl, and Tramadol hcl  Review of Systems   Review of Systems  Unable to perform ROS: Dementia  Respiratory: Negative for shortness of breath.   Cardiovascular: Negative for chest pain.  Gastrointestinal: Negative for abdominal pain.  Musculoskeletal: Positive for  arthralgias.  Neurological: Negative for headaches.    Physical Exam Updated Vital Signs BP (!) 147/119 (BP Location: Right Arm)   Pulse 87   Temp 98.7 F (37.1 C) (Oral)   Resp 18   Ht 5\' 5"  (1.651 m)   Wt 47.2 kg   LMP  (LMP Unknown)   SpO2 100%   BMI 17.31 kg/m   Physical Exam Vitals and nursing note reviewed.  Constitutional:      General: She is not in acute distress.    Appearance: Normal appearance.  HENT:     Head: Normocephalic and atraumatic.     Right Ear: Tympanic membrane normal.     Left Ear: Tympanic membrane normal.     Nose: Nose normal.  Eyes:     Conjunctiva/sclera: Conjunctivae normal.     Pupils: Pupils are equal, round, and reactive to light.  Cardiovascular:     Rate and Rhythm: Normal rate and regular rhythm.     Pulses: Normal pulses.     Heart sounds: Normal heart sounds.  Pulmonary:     Effort: Pulmonary effort is normal.     Breath sounds: Normal breath sounds.  Abdominal:     General: Abdomen is flat. Bowel sounds are normal.     Tenderness: There is no abdominal tenderness. There is no guarding.  Musculoskeletal:        General: Deformity present.     Cervical back: Normal range of motion and neck supple.     Right hip: Deformity present.     Left hip: Normal.     Right knee: Normal.     Left knee: Normal.     Right lower leg: Normal.     Left lower leg: Normal.     Right ankle: Normal.     Right Achilles Tendon: Normal.     Left ankle: Normal.     Left Achilles Tendon: Normal.     Right foot: Normal.     Left foot: Normal.  Skin:    General: Skin is warm and dry.     Capillary Refill: Capillary refill takes less than 2 seconds.  Neurological:     General: No focal deficit present.     Mental Status: She is alert.     Deep Tendon Reflexes: Reflexes normal.  Psychiatric:        Mood and Affect: Mood normal.     ED Results / Procedures / Treatments   Labs (all labs ordered are listed, but only abnormal results are  displayed) Results for orders placed or performed during the hospital encounter of 11/26/19  Respiratory Panel by RT PCR (Flu A&B, Covid) - Nasopharyngeal Swab   Specimen: Nasopharyngeal Swab  Result Value Ref Range   SARS Coronavirus 2 by RT PCR NEGATIVE NEGATIVE   Influenza A by PCR NEGATIVE NEGATIVE   Influenza B by PCR NEGATIVE NEGATIVE  CBC with Differential/Platelet  Result Value Ref Range   WBC 7.9 4.0 - 10.5 K/uL   RBC 4.01 3.87 - 5.11 MIL/uL   Hemoglobin 12.0 12.0 -  15.0 g/dL   HCT 36.9 36.0 - 46.0 %   MCV 92.0 80.0 - 100.0 fL   MCH 29.9 26.0 - 34.0 pg   MCHC 32.5 30.0 - 36.0 g/dL   RDW 13.1 11.5 - 15.5 %   Platelets 220 150 - 400 K/uL   nRBC 0.0 0.0 - 0.2 %   Neutrophils Relative % 67 %   Neutro Abs 5.4 1.7 - 7.7 K/uL   Lymphocytes Relative 23 %   Lymphs Abs 1.8 0.7 - 4.0 K/uL   Monocytes Relative 7 %   Monocytes Absolute 0.5 0.1 - 1.0 K/uL   Eosinophils Relative 1 %   Eosinophils Absolute 0.1 0.0 - 0.5 K/uL   Basophils Relative 1 %   Basophils Absolute 0.0 0.0 - 0.1 K/uL   Immature Granulocytes 1 %   Abs Immature Granulocytes 0.04 0.00 - 0.07 K/uL  I-stat chem 8, ED (not at [facility] or [facility])  Result Value Ref Range   Sodium 139 135 - 145 mmol/L   Potassium 3.8 3.5 - 5.1 mmol/L   Chloride 105 98 - 111 mmol/L   BUN 26 (H) 8 - 23 mg/dL   Creatinine, Ser 1.00 0.44 - 1.00 mg/dL   Glucose, Bld 114 (H) 70 - 99 mg/dL   Calcium, Ion 1.30 1.15 - 1.40 mmol/L   TCO2 27 22 - 32 mmol/L   Hemoglobin 11.9 (L) 12.0 - 15.0 g/dL   HCT 35.0 (L) 36.0 - 46.0 %   DG Chest 1 View  Result Date: 11/26/2019 CLINICAL DATA:  Chest pain EXAM: CHEST  1 VIEW COMPARISON:  None. FINDINGS: The heart size and mediastinal contours are within normal limits. Both lungs are clear. The visualized skeletal structures are unremarkable. IMPRESSION: No active disease. Electronically Signed   By: Ulyses Jarred M.D.   On: 11/26/2019 06:16   CT Head Wo Contrast  Result Date: 11/26/2019 CLINICAL DATA:  Pain  after fall. EXAM: CT HEAD WITHOUT CONTRAST CT CERVICAL SPINE WITHOUT CONTRAST TECHNIQUE: Multidetector CT imaging of the head and cervical spine was performed following the standard protocol without intravenous contrast. Multiplanar CT image reconstructions of the cervical spine were also generated. COMPARISON:  September 23, 2017 CT of the head FINDINGS: CT HEAD FINDINGS Brain: No subdural, epidural, or subarachnoid hemorrhage. Ventricles and sulci are prominent but stable. Cerebellum, brainstem, and basal cisterns are unremarkable. White matter changes are identified, moderate. No acute cortical ischemia or infarct. No mass effect or midline shift. Vascular: Calcified atherosclerosis is seen in the intracranial carotids. Skull: Normal. Negative for fracture or focal lesion. Sinuses/Orbits: No acute finding. Other: None. CT CERVICAL SPINE FINDINGS Alignment: 3 mm of anterolisthesis of C3 versus C4. 2 mm of anterolisthesis of C4 versus C5. No other significant malalignment. Skull base and vertebrae: No fractures. Soft tissues and spinal canal: No prevertebral fluid or swelling. No visible canal hematoma. Disc levels: Multilevel degenerative disc disease and facet degenerative changes. Upper chest: Negative. Other: No other abnormalities. IMPRESSION: 1. No acute intracranial abnormality. 2. No fracture or traumatic malalignment in the cervical spine. Significant degenerative disc disease and facet degenerative changes. Mild anterolisthesis of C3 versus C4 and C4 versus C5 is likely due to the facet degenerative changes. Electronically Signed   By: Dorise Bullion III M.D   On: 11/26/2019 05:50   CT Cervical Spine Wo Contrast  Result Date: 11/26/2019 CLINICAL DATA:  Fall EXAM: CT CERVICAL SPINE WITHOUT CONTRAST TECHNIQUE: Multidetector CT imaging of the cervical spine was performed without intravenous contrast. Multiplanar  CT image reconstructions were also generated. COMPARISON:  None. FINDINGS: Alignment: Grade 1  anterolisthesis at C3-4 and C4-5 due to facet hypertrophy. Skull base and vertebrae: No acute fracture. No primary bone lesion or focal pathologic process. Soft tissues and spinal canal: Hypertrophic pannus surrounding the odontoid process. Disc levels:  Left C4-5 facet fusion. No bony spinal canal stenosis. Upper chest: Negative. Other: None. IMPRESSION: 1. No acute fracture of the cervical spine. 2. Grade 1 anterolisthesis at C3-4 and C4-5 due to facet hypertrophy. Electronically Signed   By: Ulyses Jarred M.D.   On: 11/26/2019 05:45   DG Hip Unilat W or Wo Pelvis 2-3 Views Right  Result Date: 11/26/2019 CLINICAL DATA:  Fall EXAM: DG HIP (WITH OR WITHOUT PELVIS) 2-3V RIGHT COMPARISON:  None. FINDINGS: Comminuted intertrochanteric fracture of the right femur. No dislocation. No left femur fracture or dislocation. No other pelvic fracture. IMPRESSION: Comminuted intertrochanteric fracture of the right femur. Electronically Signed   By: Ulyses Jarred M.D.   On: 11/26/2019 06:18    Radiology No results found.  Procedures Procedures (including critical care time)  Medications Ordered in ED Medications  fentaNYL (SUBLIMAZE) injection 50 mcg (has no administration in time range)    ED Course  I have reviewed the triage vital signs and the nursing notes.  Pertinent labs & imaging results that were available during my care of the patient were reviewed by me and considered in my medical decision making (see chart for details).  Susan Banks was evaluated in Emergency Department on 11/26/2019 for the symptoms described in the history of present illness. She was evaluated in the context of the global COVID-19 pandemic, which necessitated consideration that the patient might be at risk for infection with the SARS-CoV-2 virus that causes COVID-19. Institutional protocols and algorithms that pertain to the evaluation of patients at risk for COVID-19 are in a state of rapid change based on information  released by regulatory bodies including the CDC and federal and state organizations. These policies and algorithms were followed during the patient's care in the ED.  Final Clinical Impression(s) / ED Diagnoses Final diagnoses:  Pain   Admit to medicine for hip fracture.    Tomothy Eddins, MD 11/26/19 [place], Yazmin Locher, MD 11/26/19 [id number]

## 2019-11-26 NOTE — ED Triage Notes (Signed)
Pt BIB [facility] from home due to mechanical fall. Pt hx of vertigo, trying to use RR & legs gave out. Did not hit head, denies LOC. CC R femoral/hip pain of 0/10 after 235mcg fentanyl en route. MD Polumbo in room during triage, will await orders.

## 2019-11-26 NOTE — Anesthesia Procedure Notes (Signed)
Procedure Name: Intubation Date/Time: 11/26/2019 2:55 PM Performed by: Clearnce Sorrel, CRNA Pre-anesthesia Checklist: Patient identified, Emergency Drugs available, Suction available, Patient being monitored and Timeout performed Patient Re-evaluated:Patient Re-evaluated prior to induction Oxygen Delivery Method: Circle system utilized Preoxygenation: Pre-oxygenation with 100% oxygen Induction Type: IV induction Ventilation: Mask ventilation without difficulty Laryngoscope Size: Miller and 2 Grade View: Grade I Tube type: Oral Tube size: 7.0 mm Number of attempts: 1 Airway Equipment and Method: Stylet Placement Confirmation: ETT inserted through vocal cords under direct vision,  positive ETCO2 and breath sounds checked- equal and bilateral Secured at: 22 cm Tube secured with: Tape Dental Injury: Teeth and Oropharynx as per pre-operative assessment

## 2019-11-26 NOTE — Anesthesia Postprocedure Evaluation (Signed)
Anesthesia Post Note  Patient: Susan Banks  Procedure(s) Performed: INTRAMEDULLARY (IM) NAIL INTERTROCHANTRIC (Right )     Patient location during evaluation: PACU Anesthesia Type: General Level of consciousness: awake Pain management: pain level controlled Vital Signs Assessment: post-procedure vital signs reviewed and stable Respiratory status: spontaneous breathing, nonlabored ventilation, respiratory function stable and patient connected to nasal cannula oxygen Cardiovascular status: blood pressure returned to baseline and stable Postop Assessment: no apparent nausea or vomiting Anesthetic complications: no    Last Vitals:  Vitals:   11/26/19 1720 11/26/19 1735  BP: (!) 109/97 (!) 116/54  Pulse: 89 81  Resp: 18 13  Temp:    SpO2: 94% 95%    Last Pain:  Vitals:   11/26/19 1720  TempSrc:   PainSc: 0-No pain                 Harshaan Whang P Regena Delucchi

## 2019-11-26 NOTE — ED Provider Notes (Signed)
650 case d/w Dr. Marcelino Scot who will see the patient for surgery maintain NPO   Brielle Moro, MD 11/26/19 [id number]

## 2019-11-26 NOTE — Anesthesia Preprocedure Evaluation (Addendum)
Anesthesia Evaluation  Patient identified by MRN, date of birth, ID band Patient awake    Reviewed: Allergy & Precautions, NPO status , Patient's Chart, lab work & pertinent test results  Airway Mallampati: II  TM Distance: >3 FB Neck ROM: Full    Dental  (+) Missing, Poor Dentition, Chipped   Pulmonary former smoker, PE   Pulmonary exam normal breath sounds clear to auscultation       Cardiovascular hypertension, Pt. on medications + DVT  Normal cardiovascular exam Rhythm:Regular Rate:Normal  ECG: rate 75   Neuro/Psych Dementia negative neurological ROS     GI/Hepatic Neg liver ROS, GERD  Medicated and Controlled,  Endo/Other  negative endocrine ROS  Renal/GU Renal disease     Musculoskeletal negative musculoskeletal ROS (+)   Abdominal   Peds  Hematology  (+) Blood dyscrasia, anemia , Anticoagulant    Anesthesia Other Findings closed fracture of right hip  Reproductive/Obstetrics                            Anesthesia Physical Anesthesia Plan  ASA: III  Anesthesia Plan: General   Post-op Pain Management:    Induction: Intravenous  PONV Risk Score and Plan: 3 and Ondansetron, Dexamethasone and Treatment may vary due to age or medical condition  Airway Management Planned: Oral ETT  Additional Equipment:   Intra-op Plan:   Post-operative Plan: Extubation in OR  Informed Consent: I have reviewed the patients History and Physical, chart, labs and discussed the procedure including the risks, benefits and alternatives for the proposed anesthesia with the patient or authorized representative who has indicated his/her understanding and acceptance.     Dental advisory given  Plan Discussed with: CRNA  Anesthesia Plan Comments:        Anesthesia Quick Evaluation

## 2019-11-26 NOTE — Plan of Care (Signed)
  Problem: Education: Goal: Knowledge of General Education information will improve Description: Including pain rating scale, medication(s)/side effects and non-pharmacologic comfort measures Outcome: Progressing   Problem: Pain Managment: Goal: General experience of comfort will improve Outcome: Progressing   Problem: Safety: Goal: Ability to remain free from injury will improve Outcome: Progressing   

## 2019-11-26 NOTE — H&P (Signed)
History and Physical    Susan Banks [id number] DOB: [birth year] DOA: 11/26/2019  Referring MD/NP/PA: Gean Birchwood, MD PCP: Kelton Pillar, MD  Patient coming from: Home via EMS  Chief Complaint: Fall  I have personally briefly reviewed patient's old medical records in [place]   HPI: Susan Banks is a 86 y.o. female with medical history significant of essential hypertension, hyperlipidemia, pulmonary embolus in 2018 on Eliquis, chronic kidney disease stage III, history of C. difficile, mild dementia, and GERD presents after having fall at home.  History is obtained from the patient with assistance from her daughter present at bedside.  She lives at home with her husband and is able to complete most of her ADLs without assistance.  She does not use a walker or cane.  This morning around 2 AM she had gone to use the bathroom when she states that her legs gave out on her.  She did not hit her head or lose consciousness and reported having severe pain in the right hip area.  Patient was not able to ambulate.  She had been seen recently by her primary care provider 7 days ago and diagnosed with a UTI.  They started her on 7-day course of cephalexin, but was reported that cultures did not grow out much.  Patient has also not been eating or drinking much, and has had about a 7 pound weight loss since December.  Patient had also been question to possibly be having vertigo, but symptoms also were possibly related to dehydration.  Other associated symptoms include nausea, some constipation, and insomnia. En route with EMS patient had been given 200 mcg of fentanyl.  ED Course: Upon admission into the emergency department patient was found to be mildly tachypneic with all other vital signs relatively within normal limits.  Labs were relatively unremarkable.  X-rays of the right hip revealed an acute right intertrochanteric femur fracture.  She was given fentanyl for pain.  Dr. Marcelino Scot of  orthopedics was consulted and recommended keeping patient n.p.o. for possible surgery.  TRH called to admit.  Review of Systems  Constitutional: Positive for malaise/fatigue. Negative for fever.  HENT: Negative for congestion.   Eyes: Negative for photophobia and pain.  Respiratory: Positive for shortness of breath. Negative for cough.   Cardiovascular: Negative for chest pain and leg swelling.  Gastrointestinal: Positive for constipation and nausea. Negative for diarrhea and vomiting.  Genitourinary: Negative for dysuria and hematuria.  Musculoskeletal: Positive for falls and joint pain.  Neurological: Positive for dizziness. Negative for focal weakness and loss of consciousness.  Endo/Heme/Allergies: Bruises/bleeds easily.  Psychiatric/Behavioral: Positive for memory loss. The patient has insomnia.     Past Medical History:  Diagnosis Date  . C. difficile diarrhea   . Chronic kidney disease   . Constipation   . GERD (gastroesophageal reflux disease)   . Hypercholesteremia   . Hypertension   . Shortness of breath     Past Surgical History:  Procedure Laterality Date  . ABDOMINAL SURGERY    . ESOPHAGOGASTRODUODENOSCOPY N/A 07/04/2014   Procedure: ESOPHAGOGASTRODUODENOSCOPY (EGD);  Surgeon: Laverda Page, MD;  Location: [facility];  Service: Cardiovascular;  Laterality: N/A;  . TEE WITHOUT CARDIOVERSION N/A 06/13/2014   Procedure: TRANSESOPHAGEAL ECHOCARDIOGRAM (TEE);  Surgeon: Laverda Page, MD;  Location: [facility];  Service: Cardiovascular;  Laterality: N/A;     reports that she quit smoking about 36 years ago. She has never used smokeless tobacco. She reports that she does not drink  alcohol or use drugs.  Allergies  Allergen Reactions  . Codeine Itching and Other (See Comments)    Has not tried benadryl along with medication Has not tried benadryl along with medication  . Sulfa Antibiotics Nausea And Vomiting  . Tramadol Hcl Itching  . Tramadol Hcl Itching      History reviewed. No pertinent family history.  Prior to Admission medications   Medication Sig Start Date End Date Taking? Authorizing Provider  amLODipine (NORVASC) 5 MG tablet Take 0.5 tablets (2.5 mg total) by mouth daily. Patient taking differently: Take 5 mg by mouth daily.  08/16/17  Yes Wieting, Richard, MD  apixaban (ELIQUIS) 5 MG TABS tablet 2 tablets orally twice a day for six days then one tablet twice a day afterwards Patient taking differently: Take 5 mg by mouth 2 (two) times daily.  08/16/17  Yes Wieting, Richard, MD  cephALEXin (KEFLEX) 250 MG capsule Take 250 mg by mouth 2 (two) times daily. 11/18/19  Yes [provider]  cholecalciferol (VITAMIN D) 1000 UNITS tablet Take 1,000 Units by mouth daily.   Yes [provider]  donepezil (ARICEPT) 10 MG tablet Take 1 tablet (10 mg total) by mouth daily. 08/25/19  Yes Cameron Sprang, MD  HYDROcodone-acetaminophen (NORCO/VICODIN) 5-325 MG tablet Take 1 tablet by mouth every 4 (four) hours as needed for moderate pain.  02/08/19  Yes [provider]  omeprazole (PRILOSEC) 20 MG capsule Take 20 mg by mouth daily.   Yes [provider]    Physical Exam:  Constitutional: Elderly female who appears to be in some discomfort when moving right leg Vitals:   11/26/19 0355 11/26/19 0359 11/26/19 0415  BP: (!) 147/119  128/71  Pulse: 87  80  Resp: 18  (!) 22  Temp: 98.7 F (37.1 C)    TempSrc: Oral    SpO2: 100%  100%  Weight:  47.2 kg   Height:  5\' 5"  (1.651 m)    Eyes: PERRL, lids and conjunctivae normal ENMT: Mucous membranes are dry. Posterior pharynx clear of any exudate or lesions.  Neck: normal, supple, no masses, no thyromegaly Respiratory: clear to auscultation bilaterally, no wheezing, no crackles. Normal respiratory effort. No accessory muscle use.  Cardiovascular: Regular rate and rhythm, no murmurs / rubs / gallops. No extremity edema. 2+ pedal pulses. No carotid bruits.  Abdomen: no  tenderness, no masses palpated. No hepatosplenomegaly. Bowel sounds positive.  Musculoskeletal: no clubbing / cyanosis.  Right leg is externally rotated and mildly shortened. Skin: no rashes, lesions, ulcers. No induration Neurologic: CN 2-12 grossly intact. Sensation intact, DTR normal. Strength 5/5 in all 4.  Psychiatric: Decreased short-term memory. Alert and oriented x 3. Normal mood.     Labs on Admission: I have personally reviewed following labs and imaging studies  CBC: Recent Labs  Lab 11/26/19 0403 11/26/19 0441  WBC 7.9  --   NEUTROABS 5.4  --   HGB 12.0 11.9*  HCT 36.9 35.0*  MCV 92.0  --   PLT 220  --    Basic Metabolic Panel: Recent Labs  Lab 11/26/19 0441  NA 139  K 3.8  CL 105  GLUCOSE 114*  BUN 26*  CREATININE 1.00   GFR: Estimated Creatinine Clearance: 26.7 mL/min (by C-G formula based on SCr of 1 mg/dL). Liver Function Tests: No results for input(s): AST, ALT, ALKPHOS, BILITOT, PROT, ALBUMIN in the last 168 hours. No results for input(s): LIPASE, AMYLASE in the last 168 hours. No results for input(s):  AMMONIA in the last 168 hours. Coagulation Profile: No results for input(s): INR, PROTIME in the last 168 hours. Cardiac Enzymes: No results for input(s): CKTOTAL, CKMB, CKMBINDEX, TROPONINI in the last 168 hours. BNP (last 3 results) No results for input(s): PROBNP in the last 8760 hours. HbA1C: No results for input(s): HGBA1C in the last 72 hours. CBG: No results for input(s): GLUCAP in the last 168 hours. Lipid Profile: No results for input(s): CHOL, HDL, LDLCALC, TRIG, CHOLHDL, LDLDIRECT in the last 72 hours. Thyroid Function Tests: No results for input(s): TSH, T4TOTAL, FREET4, T3FREE, THYROIDAB in the last 72 hours. Anemia Panel: No results for input(s): VITAMINB12, FOLATE, FERRITIN, TIBC, IRON, RETICCTPCT in the last 72 hours. Urine analysis:    Component Value Date/Time   COLORURINE AMBER (A) 09/23/2017 1607   APPEARANCEUR CLOUDY (A)  09/23/2017 1607   LABSPEC 1.021 09/23/2017 1607   PHURINE 6.0 09/23/2017 1607   GLUCOSEU NEGATIVE 09/23/2017 1607   HGBUR MODERATE (A) 09/23/2017 1607   BILIRUBINUR NEGATIVE 09/23/2017 1607   KETONESUR 20 (A) 09/23/2017 1607   PROTEINUR 30 (A) 09/23/2017 1607   UROBILINOGEN 0.2 06/13/2015 2025   NITRITE POSITIVE (A) 09/23/2017 1607   LEUKOCYTESUR LARGE (A) 09/23/2017 1607   Sepsis Labs: Recent Results (from the past 240 hour(s))  Respiratory Panel by RT PCR (Flu A&B, Covid) - Nasopharyngeal Swab     Status: None   Collection Time: 11/26/19  4:13 AM   Specimen: Nasopharyngeal Swab  Result Value Ref Range Status   SARS Coronavirus 2 by RT PCR NEGATIVE NEGATIVE Final    Comment: (NOTE) SARS-CoV-2 target nucleic acids are NOT DETECTED. The SARS-CoV-2 RNA is generally detectable in upper respiratoy specimens during the acute phase of infection. The lowest concentration of SARS-CoV-2 viral copies this assay can detect is 131 copies/mL. A negative result does not preclude SARS-Cov-2 infection and should not be used as the sole basis for treatment or other patient management decisions. A negative result may occur with  improper specimen collection/handling, submission of specimen other than nasopharyngeal swab, presence of viral mutation(s) within the areas targeted by this assay, and inadequate number of viral copies (<131 copies/mL). A negative result must be combined with clinical observations, patient history, and epidemiological information. The expected result is Negative. Fact Sheet for Patients:  PinkCheek.be Fact Sheet for Healthcare Providers:  GravelBags.it This test is not yet ap proved or cleared by the [place] FDA and  has been authorized for detection and/or diagnosis of SARS-CoV-2 by FDA under an Emergency Use Authorization (EUA). This EUA will remain  in effect (meaning this test can be used) for the  duration of the COVID-19 declaration under Section 564(b)(1) of the Act, 21 U.S.C. section 360bbb-3(b)(1), unless the authorization is terminated or revoked sooner.    Influenza A by PCR NEGATIVE NEGATIVE Final   Influenza B by PCR NEGATIVE NEGATIVE Final    Comment: (NOTE) The Xpert Xpress SARS-CoV-2/FLU/RSV assay is intended as an aid in  the diagnosis of influenza from Nasopharyngeal swab specimens and  should not be used as a sole basis for treatment. Nasal washings and  aspirates are unacceptable for Xpert Xpress SARS-CoV-2/FLU/RSV  testing. Fact Sheet for Patients: PinkCheek.be Fact Sheet for Healthcare Providers: GravelBags.it This test is not yet approved or cleared by the [place] FDA and  has been authorized for detection and/or diagnosis of SARS-CoV-2 by  FDA under an Emergency Use Authorization (EUA). This EUA will remain  in effect (meaning this test can be used)  for the duration of the  Covid-19 declaration under Section 564(b)(1) of the Act, 21  U.S.C. section 360bbb-3(b)(1), unless the authorization is  terminated or revoked. Performed at [facility], [place] [address] [place], [place] [address]      Radiological Exams on Admission: DG Chest 1 View  Result Date: 11/26/2019 CLINICAL DATA:  Chest pain EXAM: CHEST  1 VIEW COMPARISON:  None. FINDINGS: The heart size and mediastinal contours are within normal limits. Both lungs are clear. The visualized skeletal structures are unremarkable. IMPRESSION: No active disease. Electronically Signed   By: Ulyses Jarred M.D.   On: 11/26/2019 06:16   CT Head Wo Contrast  Result Date: 11/26/2019 CLINICAL DATA:  Pain after fall. EXAM: CT HEAD WITHOUT CONTRAST CT CERVICAL SPINE WITHOUT CONTRAST TECHNIQUE: Multidetector CT imaging of the head and cervical spine was performed following the standard protocol without intravenous contrast. Multiplanar CT image  reconstructions of the cervical spine were also generated. COMPARISON:  September 23, 2017 CT of the head FINDINGS: CT HEAD FINDINGS Brain: No subdural, epidural, or subarachnoid hemorrhage. Ventricles and sulci are prominent but stable. Cerebellum, brainstem, and basal cisterns are unremarkable. White matter changes are identified, moderate. No acute cortical ischemia or infarct. No mass effect or midline shift. Vascular: Calcified atherosclerosis is seen in the intracranial carotids. Skull: Normal. Negative for fracture or focal lesion. Sinuses/Orbits: No acute finding. Other: None. CT CERVICAL SPINE FINDINGS Alignment: 3 mm of anterolisthesis of C3 versus C4. 2 mm of anterolisthesis of C4 versus C5. No other significant malalignment. Skull base and vertebrae: No fractures. Soft tissues and spinal canal: No prevertebral fluid or swelling. No visible canal hematoma. Disc levels: Multilevel degenerative disc disease and facet degenerative changes. Upper chest: Negative. Other: No other abnormalities. IMPRESSION: 1. No acute intracranial abnormality. 2. No fracture or traumatic malalignment in the cervical spine. Significant degenerative disc disease and facet degenerative changes. Mild anterolisthesis of C3 versus C4 and C4 versus C5 is likely due to the facet degenerative changes. Electronically Signed   By: Dorise Bullion III M.D   On: 11/26/2019 05:50   CT Cervical Spine Wo Contrast  Result Date: 11/26/2019 CLINICAL DATA:  Fall EXAM: CT CERVICAL SPINE WITHOUT CONTRAST TECHNIQUE: Multidetector CT imaging of the cervical spine was performed without intravenous contrast. Multiplanar CT image reconstructions were also generated. COMPARISON:  None. FINDINGS: Alignment: Grade 1 anterolisthesis at C3-4 and C4-5 due to facet hypertrophy. Skull base and vertebrae: No acute fracture. No primary bone lesion or focal pathologic process. Soft tissues and spinal canal: Hypertrophic pannus surrounding the odontoid process.  Disc levels:  Left C4-5 facet fusion. No bony spinal canal stenosis. Upper chest: Negative. Other: None. IMPRESSION: 1. No acute fracture of the cervical spine. 2. Grade 1 anterolisthesis at C3-4 and C4-5 due to facet hypertrophy. Electronically Signed   By: Ulyses Jarred M.D.   On: 11/26/2019 05:45   DG Hip Unilat W or Wo Pelvis 2-3 Views Right  Result Date: 11/26/2019 CLINICAL DATA:  Fall EXAM: DG HIP (WITH OR WITHOUT PELVIS) 2-3V RIGHT COMPARISON:  None. FINDINGS: Comminuted intertrochanteric fracture of the right femur. No dislocation. No left femur fracture or dislocation. No other pelvic fracture. IMPRESSION: Comminuted intertrochanteric fracture of the right femur. Electronically Signed   By: Ulyses Jarred M.D.   On: 11/26/2019 06:18    X-rays of hip: Independently reviewed.  Communicated fracture of the right femur   Assessment/Plan Communicated intertrochanteric fracture of the right femur secondary to fall: Patient reportedly  had a mechanical fall at home while trying to use the restroom.  X-rays revealed a communicated intertrochanteric fracture of the right femur.  Dr. Marcelino Scot of orthopedics was consulted and they plan on doing surgery later today.  Patient's probability of perioperative myocardial infarction or cardiac arrest appears to be 0.2-0.3% based off the geriatric sensitive perioperative cardiac risk index. -Admit to a medical telemetry bed -Hip fracture order set utilized -N.p.o. 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  Patient had been in talks with PCP to discontinue this medication.  Last dose of Eliquis was taken  at around 10 PM on 3/19. -Hold Eliquis -Consider restarting Eliquis when medically able as patient we will be at increased risk for DVT/PE  Recent urinary tract infection: Patient diagnosed with a urinary tract infection and had been started on cephalexin in the outpatient setting on 3/13.   Patient does not report any active symptoms. -Continue cephalexin to complete course today  Dehydration, poor appetite: Patient reporting not eating and drinking much.  Labs revealed elevated BUN to creatinine ratio. -Gentle IV fluids of normal saline at 50 mL/h x 1 L -Check prealbumin -Ensure shakes with meals  Essential hypertension: Blood pressures currently stable.  Home medications include amlodipine 5 mg daily. -Continue amlodipine  Chronic kidney disease stage IIIa: Creatinine noted to be 1 on admission which appears near patient's baseline. -Continue to monitor  History of C. Difficile -Continue probiotics  Dementia: Patient has trouble with short-term memory, but long-term memory intact.  She is on Aricept 10 mg daily. -Continue Aricept  GERD -Continue pharmacy substitution for omeprazole 20 mg daily  DVT prophylaxis: Hold Eliquis Code Status: Full Family Communication: Discussed plan of care with the patient's family member present at bedside Disposition Plan: Likely need of rehab following surgical procedure once medically stable. Consults called: Orthopedics Admission status: Inpatient  Norval Morton MD [facility] Pager [phone]   If 7PM-7AM, please contact night-coverage [email or web address] Password [facility]  11/26/2019, 7:13 AM

## 2019-11-26 NOTE — Consult Note (Signed)
Orthopaedic Trauma Service (OTS) Consult   Patient ID: Susan Banks MRN: [id number] DOB/AGE: [birth year] 86 y.o.   Reason for Consult: right intertrochanteric hip fracture Referring Physician: April Palumbo, MD   HPI: Susan Banks is an 86 y.o. female who fell sustaining right hip pain and inability to bear weight. On Eliquis for prior PE and DVT. X-rays demonstrate displaced right intertroch. Admitted to medical service. Daughter at bedside. Pain severe with motion in right hip but aching, dull and 1-2/ 10 at rest. Denies numbness or tingling. Recent history of imbalance after standing but denies true spinning or vertigo symptoms.  Past Medical History:  Diagnosis Date  . C. difficile diarrhea   . Chronic anticoagulation   . Chronic kidney disease   . Constipation   . GERD (gastroesophageal reflux disease)   . Hypercholesteremia   . Hypertension   . Left leg DVT ([place]) 08/2017  . Pulmonary embolism ([place]) 08/2017  . Shortness of breath     Past Surgical History:  Procedure Laterality Date  . ABDOMINAL SURGERY    . ESOPHAGOGASTRODUODENOSCOPY N/A 07/04/2014   Procedure: ESOPHAGOGASTRODUODENOSCOPY (EGD);  Surgeon: Laverda Page, MD;  Location: [place] [facility];  Service: Cardiovascular;  Laterality: N/A;  . TEE WITHOUT CARDIOVERSION N/A 06/13/2014   Procedure: TRANSESOPHAGEAL ECHOCARDIOGRAM (TEE);  Surgeon: Laverda Page, MD;  Location: [facility];  Service: Cardiovascular;  Laterality: N/A;    History reviewed. No pertinent family history.  Social History:  reports that she quit smoking about 36 years ago. She has never used smokeless tobacco. She reports that she does not drink alcohol or use drugs. Husband at home who is in ill health. Daughter lives next door.   Allergies:  Allergies  Allergen Reactions  . Codeine Itching and Other (See Comments)    Has not tried benadryl along with medication Has not tried benadryl along with medication  . Sulfa  Antibiotics Nausea And Vomiting  . Tramadol Hcl Itching  . Tramadol Hcl Itching    Medications:  Prior to Admission:  Medications Prior to Admission  Medication Sig Dispense Refill Last Dose  . amLODipine (NORVASC) 5 MG tablet Take 0.5 tablets (2.5 mg total) by mouth daily. (Patient taking differently: Take 5 mg by mouth daily. )   11/25/2019 at Unknown time  . apixaban (ELIQUIS) 5 MG TABS tablet 2 tablets orally twice a day for six days then one tablet twice a day afterwards (Patient taking differently: Take 5 mg by mouth 2 (two) times daily. ) 72 tablet 0 11/25/2019 at 2200  . cephALEXin (KEFLEX) 250 MG capsule Take 250 mg by mouth 2 (two) times daily.   11/25/2019 at Unknown time  . cholecalciferol (VITAMIN D) 1000 UNITS tablet Take 1,000 Units by mouth daily.   11/25/2019 at Unknown time  . donepezil (ARICEPT) 10 MG tablet Take 1 tablet (10 mg total) by mouth daily. 90 tablet 3 11/25/2019 at Unknown time  . HYDROcodone-acetaminophen (NORCO/VICODIN) 5-325 MG tablet Take 1 tablet by mouth every 4 (four) hours as needed for moderate pain.    11/25/2019 at Unknown time  . omeprazole (PRILOSEC) 20 MG capsule Take 20 mg by mouth daily.   11/25/2019 at Unknown time    Results for orders placed or performed during the hospital encounter of 11/26/19 (from the past 48 hour(s))  CBC with Differential/Platelet     Status: None   Collection Time: 11/26/19  4:03 AM  Result Value Ref Range   WBC 7.9  4.0 - 10.5 K/uL   RBC 4.01 3.87 - 5.11 MIL/uL   Hemoglobin 12.0 12.0 - 15.0 g/dL   HCT 36.9 36.0 - 46.0 %   MCV 92.0 80.0 - 100.0 fL   MCH 29.9 26.0 - 34.0 pg   MCHC 32.5 30.0 - 36.0 g/dL   RDW 13.1 11.5 - 15.5 %   Platelets 220 150 - 400 K/uL   nRBC 0.0 0.0 - 0.2 %   Neutrophils Relative % 67 %   Neutro Abs 5.4 1.7 - 7.7 K/uL   Lymphocytes Relative 23 %   Lymphs Abs 1.8 0.7 - 4.0 K/uL   Monocytes Relative 7 %   Monocytes Absolute 0.5 0.1 - 1.0 K/uL   Eosinophils Relative 1 %   Eosinophils Absolute  0.1 0.0 - 0.5 K/uL   Basophils Relative 1 %   Basophils Absolute 0.0 0.0 - 0.1 K/uL   Immature Granulocytes 1 %   Abs Immature Granulocytes 0.04 0.00 - 0.07 K/uL    Comment: Performed at [facility] [address] [place], [place] [address]  Respiratory Panel by RT PCR (Flu A&B, Covid) - Nasopharyngeal Swab     Status: None   Collection Time: 11/26/19  4:13 AM   Specimen: Nasopharyngeal Swab  Result Value Ref Range   SARS Coronavirus 2 by RT PCR NEGATIVE NEGATIVE    Comment: (NOTE) SARS-CoV-2 target nucleic acids are NOT DETECTED. The SARS-CoV-2 RNA is generally detectable in upper respiratoy specimens during the acute phase of infection. The lowest concentration of SARS-CoV-2 viral copies this assay can detect is 131 copies/mL. A negative result does not preclude SARS-Cov-2 infection and should not be used as the sole basis for treatment or other patient management decisions. A negative result may occur with  improper specimen collection/handling, submission of specimen other than nasopharyngeal swab, presence of viral mutation(s) within the areas targeted by this assay, and inadequate number of viral copies (<131 copies/mL). A negative result must be combined with clinical observations, patient history, and epidemiological information. The expected result is Negative. Fact Sheet for Patients:  PinkCheek.be Fact Sheet for Healthcare Providers:  GravelBags.it This test is not yet ap proved or cleared by the [place] FDA and  has been authorized for detection and/or diagnosis of SARS-CoV-2 by FDA under an Emergency Use Authorization (EUA). This EUA will remain  in effect (meaning this test can be used) for the duration of the COVID-19 declaration under Section 564(b)(1) of the Act, 21 U.S.C. section 360bbb-3(b)(1), unless the authorization is terminated or revoked sooner.    Influenza A by PCR NEGATIVE NEGATIVE    Influenza B by PCR NEGATIVE NEGATIVE    Comment: (NOTE) The Xpert Xpress SARS-CoV-2/FLU/RSV assay is intended as an aid in  the diagnosis of influenza from Nasopharyngeal swab specimens and  should not be used as a sole basis for treatment. Nasal washings and  aspirates are unacceptable for Xpert Xpress SARS-CoV-2/FLU/RSV  testing. Fact Sheet for Patients: PinkCheek.be Fact Sheet for Healthcare Providers: GravelBags.it This test is not yet approved or cleared by the [place] FDA and  has been authorized for detection and/or diagnosis of SARS-CoV-2 by  FDA under an Emergency Use Authorization (EUA). This EUA will remain  in effect (meaning this test can be used) for the duration of the  Covid-19 declaration under Section 564(b)(1) of the Act, 21  U.S.C. section 360bbb-3(b)(1), unless the authorization is  terminated or revoked. Performed at [facility], [place],  [place] [address]   I-stat chem 8, ED (not at [facility] or [facility])     Status: Abnormal   Collection Time: 11/26/19  4:41 AM  Result Value Ref Range   Sodium 139 135 - 145 mmol/L   Potassium 3.8 3.5 - 5.1 mmol/L   Chloride 105 98 - 111 mmol/L   BUN 26 (H) 8 - 23 mg/dL   Creatinine, Ser 1.00 0.44 - 1.00 mg/dL   Glucose, Bld 114 (H) 70 - 99 mg/dL    Comment: Glucose reference range applies only to samples taken after fasting for at least 8 hours.   Calcium, Ion 1.30 1.15 - 1.40 mmol/L   TCO2 27 22 - 32 mmol/L   Hemoglobin 11.9 (L) 12.0 - 15.0 g/dL   HCT 35.0 (L) 36.0 - 46.0 %    DG Chest 1 View  Result Date: 11/26/2019 CLINICAL DATA:  Chest pain EXAM: CHEST  1 VIEW COMPARISON:  None. FINDINGS: The heart size and mediastinal contours are within normal limits. Both lungs are clear. The visualized skeletal structures are unremarkable. IMPRESSION: No active disease. Electronically Signed   By: Ulyses Jarred M.D.   On: 11/26/2019 06:16   CT Head  Wo Contrast  Result Date: 11/26/2019 CLINICAL DATA:  Pain after fall. EXAM: CT HEAD WITHOUT CONTRAST CT CERVICAL SPINE WITHOUT CONTRAST TECHNIQUE: Multidetector CT imaging of the head and cervical spine was performed following the standard protocol without intravenous contrast. Multiplanar CT image reconstructions of the cervical spine were also generated. COMPARISON:  September 23, 2017 CT of the head FINDINGS: CT HEAD FINDINGS Brain: No subdural, epidural, or subarachnoid hemorrhage. Ventricles and sulci are prominent but stable. Cerebellum, brainstem, and basal cisterns are unremarkable. White matter changes are identified, moderate. No acute cortical ischemia or infarct. No mass effect or midline shift. Vascular: Calcified atherosclerosis is seen in the intracranial carotids. Skull: Normal. Negative for fracture or focal lesion. Sinuses/Orbits: No acute finding. Other: None. CT CERVICAL SPINE FINDINGS Alignment: 3 mm of anterolisthesis of C3 versus C4. 2 mm of anterolisthesis of C4 versus C5. No other significant malalignment. Skull base and vertebrae: No fractures. Soft tissues and spinal canal: No prevertebral fluid or swelling. No visible canal hematoma. Disc levels: Multilevel degenerative disc disease and facet degenerative changes. Upper chest: Negative. Other: No other abnormalities. IMPRESSION: 1. No acute intracranial abnormality. 2. No fracture or traumatic malalignment in the cervical spine. Significant degenerative disc disease and facet degenerative changes. Mild anterolisthesis of C3 versus C4 and C4 versus C5 is likely due to the facet degenerative changes. Electronically Signed   By: Dorise Bullion III M.D   On: 11/26/2019 05:50   CT Cervical Spine Wo Contrast  Result Date: 11/26/2019 CLINICAL DATA:  Fall EXAM: CT CERVICAL SPINE WITHOUT CONTRAST TECHNIQUE: Multidetector CT imaging of the cervical spine was performed without intravenous contrast. Multiplanar CT image reconstructions were also  generated. COMPARISON:  None. FINDINGS: Alignment: Grade 1 anterolisthesis at C3-4 and C4-5 due to facet hypertrophy. Skull base and vertebrae: No acute fracture. No primary bone lesion or focal pathologic process. Soft tissues and spinal canal: Hypertrophic pannus surrounding the odontoid process. Disc levels:  Left C4-5 facet fusion. No bony spinal canal stenosis. Upper chest: Negative. Other: None. IMPRESSION: 1. No acute fracture of the cervical spine. 2. Grade 1 anterolisthesis at C3-4 and C4-5 due to facet hypertrophy. Electronically Signed   By: Ulyses Jarred M.D.   On: 11/26/2019 05:45   DG Hip Unilat W or Wo Pelvis 2-3 Views Right  Result Date: 11/26/2019 CLINICAL DATA:  Fall EXAM: DG HIP (WITH OR WITHOUT PELVIS) 2-3V RIGHT COMPARISON:  None. FINDINGS: Comminuted intertrochanteric fracture of the right femur. No dislocation. No left femur fracture or dislocation. No other pelvic fracture. IMPRESSION: Comminuted intertrochanteric fracture of the right femur. Electronically Signed   By: Ulyses Jarred M.D.   On: 11/26/2019 06:18    ROS No recent fever, bleeding abnormalities, urologic dysfunction, GI problems, or weight gain.  Blood pressure 128/71, pulse 80, temperature 98.7 F (37.1 C), temperature source Oral, resp. rate (!) 22, height 5\' 5"  (1.651 m), weight 47.2 kg, SpO2 100 %. Physical Exam  NCAT, seems coherent RRR No wheezing or chest retractions LLE No traumatic wounds, ecchymosis, or rash  Tender hip with shortening and external rotation  No knee or ankle effusion  No foot ulcerations or skin concerns  Sens DPN, SPN, TN intact  Motor EHL, ext, flex, evers intact grossly  DP 2+, PT 2+, No significant edema    Gait: could not observe Coordination and balance: could not observe   Assessment/Plan:  86 yo female with ground level fall, chronic anticoag with Eliquis for PE, dementia intertroch fracture for IM nailing  I discussed with the patient and her daughter the risks and  benefits of surgery, including the possibility of infection, nerve injury, vessel injury, wound breakdown, arthritis, symptomatic hardware, DVT/ PE, loss of motion, malunion, nonunion, and need for further surgery among others.  They acknowledged these risks and wished to proceed.   Weightbearing: WBAT RLE Insicional and dressing care: OK to remove dressings after 48 hrs  and leave open to air with dry gauze PRN Orthopedic device(s): None Showering: Yes please! VTE prophylaxis: Resume Eliquis  Pain control: Norco Follow - up plan: 2 weeks Contact information:  Altamese Anderson MD, Ainsley Spinner PA   Altamese Red Cloud, MD [facility], [facility] [phone]  11/26/2019, 8:32 AM  [facility] [place] [address] [phone] Domingo Sep (F)

## 2019-11-26 NOTE — Transfer of Care (Signed)
Immediate Anesthesia Transfer of Care Note  Patient: Susan Banks  Procedure(s) Performed: INTRAMEDULLARY (IM) NAIL INTERTROCHANTRIC (Right )  Patient Location: PACU  Anesthesia Type:General  Level of Consciousness: awake, alert  and oriented  Airway & Oxygen Therapy: Patient Spontanous Breathing and Patient connected to nasal cannula oxygen  Post-op Assessment: Report given to RN and Post -op Vital signs reviewed and stable  Post vital signs: Reviewed and stable  Last Vitals:  Vitals Value Taken Time  BP 141/57 11/26/19 1647  Temp    Pulse 75 11/26/19 1650  Resp 16 11/26/19 1650  SpO2 98 % 11/26/19 1650  Vitals shown include unvalidated device data.  Last Pain:  Vitals:   11/26/19 1231  TempSrc: Oral  PainSc:          Complications: No apparent anesthesia complications

## 2019-11-27 DIAGNOSIS — F0150 Vascular dementia without behavioral disturbance: Secondary | ICD-10-CM

## 2019-11-27 LAB — BASIC METABOLIC PANEL
Anion gap: 10 (ref 5–15)
BUN: 21 mg/dL (ref 8–23)
CO2: 23 mmol/L (ref 22–32)
Calcium: 9.7 mg/dL (ref 8.9–10.3)
Chloride: 105 mmol/L (ref 98–111)
Creatinine, Ser: 1.11 mg/dL — ABNORMAL HIGH (ref 0.44–1.00)
GFR calc Af Amer: 50 mL/min — ABNORMAL LOW (ref >60)
GFR calc non Af Amer: 43 mL/min — ABNORMAL LOW (ref >60)
Glucose, Bld: 174 mg/dL — ABNORMAL HIGH (ref 70–99)
Potassium: 5.1 mmol/L (ref 3.5–5.1)
Sodium: 138 mmol/L (ref 135–145)

## 2019-11-27 LAB — CBC
HCT: 23.2 % — ABNORMAL LOW (ref 36.0–46.0)
Hemoglobin: 7.6 g/dL — ABNORMAL LOW (ref 12.0–15.0)
MCH: 30.4 pg (ref 26.0–34.0)
MCHC: 32.8 g/dL (ref 30.0–36.0)
MCV: 92.8 fL (ref 80.0–100.0)
Platelets: 153 10*3/uL (ref 150–400)
RBC: 2.5 MIL/uL — ABNORMAL LOW (ref 3.87–5.11)
RDW: 13.2 % (ref 11.5–15.5)
WBC: 12.5 10*3/uL — ABNORMAL HIGH (ref 4.0–10.5)
nRBC: 0 % (ref 0.0–0.2)

## 2019-11-27 LAB — VITAMIN D 25 HYDROXY (VIT D DEFICIENCY, FRACTURES): Vit D, 25-Hydroxy: 55.94 ng/mL (ref 30–100)

## 2019-11-27 LAB — PREPARE RBC (CROSSMATCH)

## 2019-11-27 MED ORDER — POLYETHYLENE GLYCOL 3350 17 G PO PACK
17.0000 g | PACK | Freq: Every day | ORAL | Status: AC
  Administered 2019-11-27 – 2019-11-29 (×3): 17 g via ORAL
  Filled 2019-11-27 (×3): qty 1

## 2019-11-27 MED ORDER — APIXABAN 2.5 MG PO TABS
2.5000 mg | ORAL_TABLET | Freq: Two times a day (BID) | ORAL | Status: DC
  Administered 2019-11-27 – 2019-11-30 (×6): 2.5 mg via ORAL
  Filled 2019-11-27 (×6): qty 1

## 2019-11-27 MED ORDER — SODIUM CHLORIDE 0.9 % IV SOLN: INTRAVENOUS | Status: AC

## 2019-11-27 MED ORDER — ACETAMINOPHEN 325 MG PO TABS
650.0000 mg | ORAL_TABLET | Freq: Once | ORAL | Status: AC
  Administered 2019-11-27: 650 mg via ORAL
  Filled 2019-11-27: qty 2

## 2019-11-27 MED ORDER — FUROSEMIDE 10 MG/ML IJ SOLN
20.0000 mg | Freq: Once | INTRAMUSCULAR | Status: AC
  Administered 2019-11-27: 20 mg via INTRAVENOUS
  Filled 2019-11-27: qty 2

## 2019-11-27 MED ORDER — LIDOCAINE 5 % EX PTCH
1.0000 | MEDICATED_PATCH | CUTANEOUS | Status: DC
  Administered 2019-11-27 – 2019-11-29 (×3): 1 via TRANSDERMAL
  Filled 2019-11-27 (×3): qty 1

## 2019-11-27 MED ORDER — SODIUM CHLORIDE 0.9% IV SOLUTION: Freq: Once | INTRAVENOUS | Status: AC

## 2019-11-27 NOTE — Progress Notes (Addendum)
Physical Therapy Evaluation Patient Details Name: Susan Banks MRN: [id number] DOB: Aug 13, 1926 Today's Date: 11/27/2019   History of Present Illness  Susan Banks is a 86 y.o. female with medical history significant of essential hypertension, hyperlipidemia, pulmonary embolus in 2018 on Eliquis, chronic kidney disease stage III, history of C. difficile, mild dementia, and GERD presents after having fall at home.  PTA pt reports completing most ADLs without assistance and ambulated without AD. Imaging demonstrated R hip fracture. Pt has lots 7 pounds since December. Pt underwent ORIF R hip 11/26/19.     Clinical Impression  Cotx with OT today. Pt is very motivated to work with therapy today. Pt appears to have mild dementia which does not appear to affect current therapeutic outcomes today. PTA patient was independent in mobility without assistive device, able to perform ADLs and share IADLs with spouse. She has a strong family support system at home, and high prior level of mobility/flexibility. Pt requires max to total assist with all functional mobility today and is limited primarily due to fatigue/nausea. Anticipate improved participation/ability tomorrow based on limiting factors today and medical treatment to address those. Pt demonstrates reduced functional transfer ability, reduced LE strength, inability to ambulate safely today, and inability to negotiate stairs needed to return home safely.  Due to PLOF and family support recommend CIR to evaluate patient for admission with expected d/c home. PT will continue to follow patient to address above and below listed deficits until transition to next level of care.      Follow Up Recommendations CIR    Equipment Recommendations  Other (comment)(TBD at next venue of care)    Recommendations for Other Services Rehab consult     Precautions / Restrictions Precautions Precautions: Anterior Hip Precaution Booklet Issued:  No Restrictions Weight Bearing Restrictions: Yes RLE Weight Bearing: Weight bearing as tolerated Other Position/Activity Restrictions: no range of motion precautions      Mobility  Bed Mobility Overal bed mobility: Needs Assistance Bed Mobility: Rolling;Supine to Sit;Sit to Supine Rolling: Max assist;+2 for physical assistance   Supine to sit: Max assist;+2 for physical assistance Sit to supine: Max assist;+2 for physical assistance   General bed mobility comments: Pt demonstrated fair sitting balance requiring mod transitioning to CGA at EOB for safety. Requires multiples   Transfers Overall transfer level: Needs assistance Equipment used: Rolling walker (2 wheeled) Transfers: Sit to/from Stand Sit to Stand: Total assist;+2 physical assistance;From elevated surface         General transfer comment: Pt required ModA x 2 support at hips for standing balance.  Ambulation/Gait  General Gait Details: Unable to perform today      Balance Overall balance assessment: Needs assistance Sitting-balance support: Single extremity supported;Feet supported Sitting balance-Leahy Scale: Poor Sitting balance - Comments: Required heavy LUE for balance initially, but able to progress to minimal UE usage. Limited primairily due to fatigue today. Postural control: Left lateral lean Standing balance support: Bilateral upper extremity supported Standing balance-Leahy Scale: Zero Standing balance comment: Required assistance to maintain standing balance         Pertinent Vitals/Pain Pain Assessment: Faces Faces Pain Scale: Hurts whole lot Pain Location: right hip Pain Descriptors / Indicators: Grimacing;Guarding Pain Intervention(s): Limited activity within patient's tolerance;Repositioned;Monitored during session(RN aware)    Home Living Family/patient expects to be discharged to:: Private residence Living Arrangements: Spouse/significant other;Other (Comment)(next door to  daughter) Available Help at Discharge: Family Type of Home: House Home Access: Stairs to enter Entrance Stairs-Rails: None Entrance  Stairs-Number of Steps: 3 Home Layout: One level Home Equipment: Shower seat;Cane - single point;Walker - 2 wheels(Counter near toilet for UE support) Additional Comments: PLOF obtained from daughter present in room    Prior Function Level of Independence: Needs assistance   Gait / Transfers Assistance Needed: Independent without AD  ADL's / Homemaking Assistance Needed: Pt independent in all ADLs. Pt and husband share IADL responsibilities.   Comments: Wears glasses daily     Hand Dominance   Dominant Hand: Right    Extremity/Trunk Assessment   Upper Extremity Assessment Upper Extremity Assessment: Defer to OT evaluation    Lower Extremity Assessment Lower Extremity Assessment: Generalized weakness       Communication   Communication: Receptive difficulties  Cognition Arousal/Alertness: Awake/alert Behavior During Therapy: WFL for tasks assessed/performed Overall Cognitive Status: History of cognitive impairments - at baseline     General Comments: Pt hesistant to mobilize EOB requiring min to mod encouragement.       General Comments General comments (skin integrity, edema, etc.): 1 fall in last 6 months. Pt reports being dizzy and slide down wall while holding onto wall. Pt reports thinking she had vertigo. Pt reports difficulty sitting up straight in the mornings due to dizziness with intermittent bouts in the day.  Pt stopped driving approximately 1 year ago. Supine BP 133/59, sitting BP         Assessment/Plan    PT Assessment Patient needs continued PT services  PT Problem List Decreased strength;Decreased activity tolerance;Decreased mobility;Decreased balance;Decreased knowledge of use of DME;Decreased safety awareness;Decreased knowledge of precautions;Cardiopulmonary status limiting activity;Pain       PT Treatment  Interventions DME instruction;Gait training;Stair training;Therapeutic activities;Therapeutic exercise;Functional mobility training;Balance training;Patient/family education;Neuromuscular re-education;Modalities    PT Goals (Current goals can be found in the Care Plan section)  Acute Rehab PT Goals Patient Stated Goal: Return home with support PT Goal Formulation: With patient/family Time For Goal Achievement: 12/11/19 Potential to Achieve Goals: Good    Frequency Min 5X/week   Barriers to discharge Inaccessible home environment 3 steps to enter    Co-evaluation PT/OT/SLP Co-Evaluation/Treatment: Yes Reason for Co-Treatment: For patient/therapist safety;To address functional/ADL transfers PT goals addressed during session: Proper use of DME;Strengthening/ROM;Mobility/safety with mobility OT goals addressed during session: ADL's and self-care;Strengthening/ROM       AM-PAC PT "6 Clicks" Mobility  Outcome Measure Help needed turning from your back to your side while in a flat bed without using bedrails?: Total Help needed moving from lying on your back to sitting on the side of a flat bed without using bedrails?: Total Help needed moving to and from a bed to a chair (including a wheelchair)?: Total Help needed standing up from a chair using your arms (e.g., wheelchair or bedside chair)?: Total Help needed to walk in hospital room?: Total Help needed climbing 3-5 steps with a railing? : Total 6 Click Score: 6    End of Session Equipment Utilized During Treatment: Gait belt Activity Tolerance: Patient limited by fatigue;Treatment limited secondary to medical complications (Comment)(low hemoglobin, will be recieving 2 units of blood per MD) Patient left: in bed;with bed alarm set;with SCD's reapplied;with family/visitor present Nurse Communication: Mobility status PT Visit Diagnosis: Unsteadiness on feet (R26.81);Muscle weakness (generalized) (M62.81);History of falling  (Z91.81);Difficulty in walking, not elsewhere classified (R26.2)    Time: 0826-0906 PT Time Calculation (min) (ACUTE ONLY): 40 min   Charges:   PT Evaluation $PT Eval Moderate Complexity: 1 Mod PT Treatments $Therapeutic Activity: 8-22 mins  Ann Held PT, DPT Acute Rehab [facility] Rehabilitation P: [phone]   Renato Gails 11/27/2019, 2:04 PM

## 2019-11-27 NOTE — Progress Notes (Signed)
Occupational Therapy Evaluation   Clinical Impression: PTA pt resided with husband, independent in all ADL and mobility tasks. Pt does not ambulate with an assistive device and reports 1 fall in the last 6 months. Pt and husband share IADL responsibilities. Pt currently requires setup to total assist x 2 for self-care and functional transfer tasks. Pt tolerated sitting EOB 10+ min while engaging in therapy tasks. Pt unable to maintain upright posture and positioning while seated EOB due to nausea and pain. Pt initially required mod assist for sitting balance, progressing to CGA. Noted significant left lateral lean in sitting, likely due to pt offloading right hip. Attempted sit/stand x 1 with pt requiring total assist x 2. Once in standing, pt only required mod assist x 2 to maintain balance. Additional mobility and transfers not attempted this date due to safety concerns, nausea, and pain. Pt assisted back to bed and positioned for comfort. Pt demonstrates decreased strength, endurance, balance, sitting/standing tolerance, and activity tolerance impacting ability to complete self-care and functional transfer tasks. Recommend skilled OT services to address above deficits in order to promote function and prevent further decline. Recommend CIR for additional rehab prior to discharge home.     11/27/19 1002  OT Visit Information  Last OT Received On 11/27/19  Assistance Needed +2  PT/OT/SLP Co-Evaluation/Treatment Yes  Reason for Co-Treatment For patient/therapist safety;To address functional/ADL transfers  OT goals addressed during session ADL's and self-care;Strengthening/ROM  History of Present Illness Susan Banks is a 86 y.o. female with medical history significant of essential hypertension, hyperlipidemia, pulmonary embolus in 2018 on Eliquis, chronic kidney disease stage III, history of C. difficile, mild dementia, and GERD presents after having fall at home.  PTA pt reports completing most ADLs  without assistance and ambulated without AD. Imaging demonstrated R hip fracture. Pt has lots 7 pounds since December. Pt underwent ORIF R hip 11/26/19.   Precautions  Precautions Anterior Hip  Precaution Booklet Issued No  Restrictions  Weight Bearing Restrictions Yes  RLE Weight Bearing WBAT  Other Position/Activity Restrictions no range of motion precautions  Home Living  Family/patient expects to be discharged to: Private residence  Living Arrangements Spouse/significant other (Daughter lives next door)  Available Help at Discharge Family  Type of [place] to enter  Entrance Stairs-Number of Steps 3  Entrance Stairs-Rails None  Home Layout One level  Bathroom Shower/Tub Tub/shower unit (takes baths)  Financial controller seat;Cane - single point;Walker - 2 wheels  Additional Comments PLOF obtained from daughter present in room  Prior Function  Level of Independence Needs assistance  Gait / [place] without AD. Pt has had 1 fall in the last 6 months.   ADL's / Homemaking Assistance Needed Pt independent in all ADLs. Pt and husband share IADL responsibilities.   Comments Per daughter pt takes baths and is able to get down into tub on her own with use of grab bar.   Communication  Communication Receptive difficulties  Pain Assessment  Pain Assessment Faces  Faces Pain Scale 8  Pain Location right hip  Pain Descriptors / Indicators Grimacing;Guarding (nausea)  Pain Intervention(s) Limited activity within patient's tolerance;Monitored during session;Repositioned (RN aware)  Cognition  Arousal/Alertness Awake/alert  Behavior During Therapy WFL for tasks assessed/performed  Overall Cognitive Status History of cognitive impairments - at baseline  General Comments Pt hesistant to mobilize EOB requiring min to mod encouragement.   Upper Extremity Assessment  Upper Extremity  Assessment Generalized  weakness  Lower Extremity Assessment  Lower Extremity Assessment Defer to PT evaluation  ADL  Overall ADL's  Needs assistance/impaired  Eating/Feeding Set up;Bed level  Grooming Set up;Supervision/safety;Bed level  Upper Body Bathing Sitting;Moderate assistance  Lower Body Bathing Total assistance;Sitting/lateral leans  Upper Body Dressing  Moderate assistance;Maximal assistance;Sitting  Lower Body Dressing Total assistance;Sitting/lateral leans;Sit to/from stand;+2 for physical assistance  Toilet Transfer Total assistance (bed level)  Toileting- Clothing Manipulation and Hygiene Total assistance;Bed level  Functional mobility during ADLs Total assistance;+2 for physical assistance  General ADL Comments Pt tolerated sitting EOB 10+ min while engaging in therapy tasks. Pt required total assist x 2 for sit/stand. Pt only able to complete x 1 with additional mobility not attempted due to safety concerns, pain, and nausea.   Vision- History  Baseline Vision/History Wears glasses  Wears Glasses At all times  Bed Mobility  Overal bed mobility Needs Assistance  Bed Mobility Rolling;Supine to Sit;Sit to Supine  Rolling Max assist;+2 for physical assistance  Supine to sit Max assist;+2 for physical assistance  Sit to supine Max assist;+2 for physical assistance  General bed mobility comments Pt unable to maintain upright posture and positioning while seated EOB due to nausea and pain. Pt initially required mod assist for sitting balance, progressing to CGA. Noted significant left lateral lean in sitting, likely due to pt offloading right hip.   Transfers  Overall transfer level Needs assistance  Equipment used Rolling walker (2 wheeled)  Transfers Sit to/from Stand  Sit to Stand Total assist;+2 physical assistance;From elevated surface  General transfer comment Completed sit/stand x 1. Pt required mod assist x 2 to maintain standing balance.   Balance  Overall balance assessment Needs  assistance  Sitting-balance support Single extremity supported;Feet supported  Sitting balance-Leahy Scale Poor  Postural control Left lateral lean  Standing balance support Bilateral upper extremity supported  Standing balance-Leahy Scale Zero  General Comments  General comments (skin integrity, edema, etc.) HR increased to 113 upon sitting EOB. Supine BP: 133/17mmHg. Sitting BP: 121/35mmHg. Unable to obtain standing balance as pt was unable to stand long enough.   OT - End of Session  Equipment Utilized During Treatment Gait belt;Rolling walker  Activity Tolerance Patient limited by fatigue;Patient limited by pain (Limited by nausea)  Patient left in bed;with call bell/phone within reach;with bed alarm set;with family/visitor present  Nurse Communication Mobility status  OT Assessment  OT Recommendation/Assessment Patient needs continued OT Services  OT Visit Diagnosis Unsteadiness on feet (R26.81);Muscle weakness (generalized) (M62.81);Pain  Pain - Right/Left Right  Pain - part of body Hip  OT Problem List Decreased strength;Decreased activity tolerance;Impaired balance (sitting and/or standing);Decreased safety awareness;Pain;Decreased knowledge of use of DME or AE  OT Plan  OT Frequency (ACUTE ONLY) Min 3X/week  OT Treatment/Interventions (ACUTE ONLY) Self-care/ADL training;Therapeutic exercise;Neuromuscular education;Energy conservation;DME and/or AE instruction;Therapeutic activities;Patient/family education;Balance training  AM-PAC OT "6 Clicks" Daily Activity Outcome Measure (Version 2)  Help from another person eating meals? 3  Help from another person taking care of personal grooming? 3  Help from another person toileting, which includes using toliet, bedpan, or urinal? 1  Help from another person bathing (including washing, rinsing, drying)? 2  Help from another person to put on and taking off regular upper body clothing? 2  Help from another person to put on and taking off  regular lower body clothing? 1  6 Click Score 12  OT Recommendation  Follow Up Recommendations CIR;Supervision/Assistance - 24 hour  OT Equipment Other (  comment) (TBD)  Acute Rehab OT Goals  Patient Stated Goal To go home  Time For Goal Achievement 12/11/19  Potential to Achieve Goals Good  OT Time Calculation  OT Start Time (ACUTE ONLY) 0826  OT Stop Time (ACUTE ONLY) 0906  OT Time Calculation (min) 40 min  OT General Charges  $OT Visit 1 Visit  OT Evaluation  $OT Eval Moderate Complexity 1 Mod  Written Expression  Dominant Hand Right    Mauri Brooklyn OTR/L [phone]

## 2019-11-27 NOTE — Evaluation (Signed)
Clinical/Bedside Swallow Evaluation Patient Details  Name: Susan Banks MRN: [id number] Date of Birth: Nov 15, 1925  Today's Date: 11/27/2019 Time: SLP Start Time (ACUTE ONLY): 0806 SLP Stop Time (ACUTE ONLY): 0830 SLP Time Calculation (min) (ACUTE ONLY): 24 min  Past Medical History:  Past Medical History:  Diagnosis Date  . C. difficile diarrhea   . Chronic anticoagulation   . Chronic kidney disease   . Constipation   . GERD (gastroesophageal reflux disease)   . Hypercholesteremia   . Hypertension   . Left leg DVT ([place]) 08/2017  . Pulmonary embolism ([place]) 08/2017  . Shortness of breath    Past Surgical History:  Past Surgical History:  Procedure Laterality Date  . ABDOMINAL SURGERY    . ESOPHAGOGASTRODUODENOSCOPY N/A 07/04/2014   Procedure: ESOPHAGOGASTRODUODENOSCOPY (EGD);  Surgeon: Laverda Page, MD;  Location: [facility];  Service: Cardiovascular;  Laterality: N/A;  . TEE WITHOUT CARDIOVERSION N/A 06/13/2014   Procedure: TRANSESOPHAGEAL ECHOCARDIOGRAM (TEE);  Surgeon: Laverda Page, MD;  Location: [place] [facility];  Service: Cardiovascular;  Laterality: N/A;   HPI:  Susan Banks is a 86 y.o. female with medical history significant of essential hypertension, hyperlipidemia, pulmonary embolus in 2018 on Eliquis, chronic kidney disease stage III, history of C. difficile, mild dementia, and GERD presents after having fall at home.  History is obtained from the patient with assistance from her daughter present at bedside.  She lives at home with her husband and is able to complete most of her ADLs without assistance.  She does not use a walker or cane.  This morning around 2 AM she had gone to use the bathroom when she states that her legs gave out on her.  She did not hit her head or lose consciousness and reported having severe pain in the right hip area.  Patient was not able to ambulate.  She had been seen recently by her primary care provider 7 days ago and diagnosed with a  UTI.  They started her on 7-day course of cephalexin, but was reported that cultures did not grow out much.  Patient has also not been eating or drinking much, and has had about a 7 pound weight loss since December.  Patient had also been question to possibly be having vertigo, but symptoms also were possibly related to dehydration.  Other associated symptoms include nausea, some constipation, and insomnia.  ED Course: Upon admission into the emergency department patient was found to be mildly tachypneic with all other vital signs relatively within normal limits.  Labs were relatively unremarkable.  X-rays of the right hip revealed an acute right intertrochanteric femur fracture.  She was given fentanyl for pain.  Most recent chest xray was showing no active disease.   Assessment / Plan / Recommendation Clinical Impression  Clinical swallowing evaluation was completed using thin liquids via spoon, cup and straw, pureed material and dry solids.  RN reported no obvious issues when taking medication.  The patient reported issues taking a pill last night that got stuck in her throat leaving a bitter taste and slight discomfort.  She also complains of a mild sore throat.  Her daughter reported patient had a regular barium swallow about 5 years ago.   Per charting patient had a regular barium swallow dated 08/17/2015 with the following findings:  3 cm Zenker's diverticulum trapped the barium pill during swallowing. The diverticulum is probably causing the adjacent cervical esophageal narrowing. 2. Moderate-sized hiatal hernia.  Cranial nerve exam was completed and unremarkable.  Lingual, labial, facial and jaw range of motion and strength appeared adequate.  Facial sensation appeared to be intact.  Her oral and pharyngeal swallow appeared to be functional.  Mastication of dry solids appeared adequate.  Swallow trigger was appreciated to palpation and consistent overt s/s of aspiration were not seen.  Patient noted to  have delayed throat clear 2-3x throughout evaluation.   Recommend that she continue on current diet with MBS next date to fully assess swallowing physiology and safety.   SLP Visit Diagnosis: Dysphagia, unspecified (R13.10)    Aspiration Risk  Mild aspiration risk    Diet Recommendation   Regular with thin liquids pending MBS  Medication Administration: Whole meds with liquid    Other  Recommendations Oral Care Recommendations: Oral care BID   Follow up Recommendations Other (comment)(TBD)        Swallow Study   General Date of Onset: 11/26/19 HPI: Susan Banks is a 86 y.o. female with medical history significant of essential hypertension, hyperlipidemia, pulmonary embolus in 2018 on Eliquis, chronic kidney disease stage III, history of C. difficile, mild dementia, and GERD presents after having fall at home.  History is obtained from the patient with assistance from her daughter present at bedside.  She lives at home with her husband and is able to complete most of her ADLs without assistance.  She does not use a walker or cane.  This morning around 2 AM she had gone to use the bathroom when she states that her legs gave out on her.  She did not hit her head or lose consciousness and reported having severe pain in the right hip area.  Patient was not able to ambulate.  She had been seen recently by her primary care provider 7 days ago and diagnosed with a UTI.  They started her on 7-day course of cephalexin, but was reported that cultures did not grow out much.  Patient has also not been eating or drinking much, and has had about a 7 pound weight loss since December.  Patient had also been question to possibly be having vertigo, but symptoms also were possibly related to dehydration.  Other associated symptoms include nausea, some constipation, and insomnia.  ED Course: Upon admission into the emergency department patient was found to be mildly tachypneic with all other vital signs relatively  within normal limits.  Labs were relatively unremarkable.  X-rays of the right hip revealed an acute right intertrochanteric femur fracture.  She was given fentanyl for pain.  Most recent chest xray was showing no active disease. Type of Study: Bedside Swallow Evaluation Previous Swallow Assessment: None noted at [facility]. Diet Prior to this Study: Regular;Thin liquids Temperature Spikes Noted: No Respiratory Status: Room air History of Recent Intubation: Yes Length of Intubations (days): (for surgery only) Date extubated: 11/26/19 Behavior/Cognition: Alert;Cooperative;Pleasant mood Oral Cavity Assessment: Within Functional Limits Oral Care Completed by SLP: No Oral Cavity - Dentition: Adequate natural dentition Vision: Functional for self-feeding Self-Feeding Abilities: Able to feed self Patient Positioning: Upright in bed Baseline Vocal Quality: Normal Volitional Cough: Other (Comment)(Not tested) Volitional Swallow: Able to elicit    Oral/Motor/Sensory Function Overall Oral Motor/Sensory Function: Within functional limits   Ice Chips Ice chips: Not tested   Thin Liquid Thin Liquid: Within functional limits Presentation: Cup;Self Fed;Spoon;Straw    Nectar Thick Nectar Thick Liquid: Not tested   Honey Thick Honey Thick Liquid: Not tested   Puree Puree: Within functional limits Presentation: Spoon   Solid  Solid: Within functional limits Presentation: [place], [place], [place] Acute Rehab SLP [phone]  Lamar Sprinkles 11/27/2019,8:39 AM

## 2019-11-27 NOTE — Progress Notes (Addendum)
PROGRESS NOTE  Susan Banks WVP:[id number] DOB: 06/28/1926 DOA: 11/26/2019 PCP: Kelton Pillar, MD  Brief summary: Susan Banks is a 86 y.o. female with medical history significant of essential hypertension, hyperlipidemia, pulmonary embolus in 2018 on Eliquis, chronic kidney disease stage III, history of C. difficile, mild dementia, and GERD presents after having fall at home.  Found to have right anterior trochanteric hip fracture, status post IM and on March 20, getting blood transfusion for postop anemia, family prefers CIR placement  HPI/Recap of past 24 hours:  Getting blood transfusion,  c/o chronic back pain,  c/o being constipated Daughter at bedside  Assessment/Plan: Active Problems:   HTN (hypertension)   Chronic kidney disease, stage 3   GERD (gastroesophageal reflux disease)   Fracture, intertrochanteric, right femur (HCC)   Chronic anticoagulation   History of pulmonary embolus (PE)   History of DVT (deep vein thrombosis)   Fall at home, initial encounter   Right intertrochanteric hip fracture from mechanical fall -Status post IMN -Weightbearing as tolerated right leg -Continue vitamin D supplement home dosing, Ortho recommend DEXA scan in outpatient setting in 6 to 8 weeks. -Plan per Ortho  Postop anemia -Getting PRBC transfusion -Repeat CBC in the morning  History of PE and DVT on chronic anticoagulation with Eliquis -Ortho okayed to resume Eliquis. -Eliquis dose decreased to 2.5 mg twice daily due to age and weight.  Recent urinary tract infection: Patient diagnosed with a urinary tract infection and had been started on cephalexin in the outpatient setting on 3/13.   Patient does not report any active symptoms. -Continue cephalexin to complete course -UA unremarkable on admission  Dehydration, poor appetite: Patient reporting not eating and drinking much. -Gentle hydration for 24 hours, encourage oral intake  CKD 3a stable at baseline, renal  dosing meds   Dementia: Patient has trouble with short-term memory, but long-term memory intact.  She is on Aricept 10 mg daily. -Not oriented to the year, oriented to month place and person. -Continue Aricept   The patient reported issues taking a pill last night that got stuck in her throat leaving a bitter taste and slight discomfort -Speech eval, to have modified barium study tomorrow  Chronic low back pain, K pad  Constipation: Stool softener    D/c tele  DVT Prophylaxis: Eliquis  Code Status: Full  Family Communication: patient and daughter at bedside  Disposition Plan:    Patient came from:                           Home                                                                                Anticipated d/c place: family desires CIR, awaiting PT eval  Barriers to d/c OR conditions which need to be met to effect a safe d/c:  CIR placement once hemoglobin stable, discharge likely in 24 to 48 hours.   Consultants:  Orthopedics  CIR  Procedures:  IMN right hip March 20  Antibiotics:  Perioperative Ancef   Objective: BP (!) 113/58 (BP Location: Right Arm)   Pulse 71   Temp 98  F (36.7 C) (Oral)   Resp 18   Ht '5\' 5"'  (1.651 m)   Wt 47.2 kg   LMP  (LMP Unknown)   SpO2 92%   BMI 17.31 kg/m   Intake/Output Summary (Last 24 hours) at 11/27/2019 0740 Last data filed at 11/27/2019 0555 Gross per 24 hour  Intake 1250 ml  Output 700 ml  Net 550 ml   Filed Weights   11/26/19 0359  Weight: 47.2 kg    Exam: Patient is examined daily including today on 11/27/2019, exams remain the same as of yesterday except that has changed    General:  NAD, not oriented to the year, but to the month, place and person  Cardiovascular: RRR with ectopic beats  Respiratory: CTABL  Abdomen: Soft/ND/NT, positive BS  Musculoskeletal: No Edema, right hip postop changes, neurovascular intact distally  Neuro: alert, oriented to person and place, know the month,  not to the year  Data Reviewed: Basic Metabolic Panel: Recent Labs  Lab 11/26/19 0441 11/27/19 0410  NA 139 138  K 3.8 5.1  CL 105 105  CO2  --  23  GLUCOSE 114* 174*  BUN 26* 21  CREATININE 1.00 1.11*  CALCIUM  --  9.7   Liver Function Tests: No results for input(s): AST, ALT, ALKPHOS, BILITOT, PROT, ALBUMIN in the last 168 hours. No results for input(s): LIPASE, AMYLASE in the last 168 hours. No results for input(s): AMMONIA in the last 168 hours. CBC: Recent Labs  Lab 11/26/19 0403 11/26/19 0441 11/26/19 2131 11/27/19 0410  WBC 7.9  --   --  12.5*  NEUTROABS 5.4  --   --   --   HGB 12.0 11.9* 8.6* 7.6*  HCT 36.9 35.0* 26.4* 23.2*  MCV 92.0  --   --  92.8  PLT 220  --   --  153   Cardiac Enzymes:   No results for input(s): CKTOTAL, CKMB, CKMBINDEX, TROPONINI in the last 168 hours. BNP (last 3 results) No results for input(s): BNP in the last 8760 hours.  ProBNP (last 3 results) No results for input(s): PROBNP in the last 8760 hours.  CBG: No results for input(s): GLUCAP in the last 168 hours.  Recent Results (from the past 240 hour(s))  Respiratory Panel by RT PCR (Flu A&B, Covid) - Nasopharyngeal Swab     Status: None   Collection Time: 11/26/19  4:13 AM   Specimen: Nasopharyngeal Swab  Result Value Ref Range Status   SARS Coronavirus 2 by RT PCR NEGATIVE NEGATIVE Final    Comment: (NOTE) SARS-CoV-2 target nucleic acids are NOT DETECTED. The SARS-CoV-2 RNA is generally detectable in upper respiratoy specimens during the acute phase of infection. The lowest concentration of SARS-CoV-2 viral copies this assay can detect is 131 copies/mL. A negative result does not preclude SARS-Cov-2 infection and should not be used as the sole basis for treatment or other patient management decisions. A negative result may occur with  improper specimen collection/handling, submission of specimen other than nasopharyngeal swab, presence of viral mutation(s) within  the areas targeted by this assay, and inadequate number of viral copies (<131 copies/mL). A negative result must be combined with clinical observations, patient history, and epidemiological information. The expected result is Negative. Fact Sheet for Patients:  PinkCheek.be Fact Sheet for Healthcare Providers:  GravelBags.it This test is not yet ap proved or cleared by the [place] FDA and  has been authorized for detection and/or diagnosis of SARS-CoV-2 by FDA under an Emergency  Use Authorization (EUA). This EUA will remain  in effect (meaning this test can be used) for the duration of the COVID-19 declaration under Section 564(b)(1) of the Act, 21 U.S.C. section 360bbb-3(b)(1), unless the authorization is terminated or revoked sooner.    Influenza A by PCR NEGATIVE NEGATIVE Final   Influenza B by PCR NEGATIVE NEGATIVE Final    Comment: (NOTE) The Xpert Xpress SARS-CoV-2/FLU/RSV assay is intended as an aid in  the diagnosis of influenza from Nasopharyngeal swab specimens and  should not be used as a sole basis for treatment. Nasal washings and  aspirates are unacceptable for Xpert Xpress SARS-CoV-2/FLU/RSV  testing. Fact Sheet for Patients: PinkCheek.be Fact Sheet for Healthcare Providers: GravelBags.it This test is not yet approved or cleared by the [place] FDA and  has been authorized for detection and/or diagnosis of SARS-CoV-2 by  FDA under an Emergency Use Authorization (EUA). This EUA will remain  in effect (meaning this test can be used) for the duration of the  Covid-19 declaration under Section 564(b)(1) of the Act, 21  U.S.C. section 360bbb-3(b)(1), unless the authorization is  terminated or revoked. Performed at [facility], [place] [address], [place], [place] [address]   Surgical pcr screen     Status: None   Collection Time: 11/26/19   8:58 AM   Specimen: Nasal Mucosa; Nasal Swab  Result Value Ref Range Status   MRSA, PCR NEGATIVE NEGATIVE Final   Staphylococcus aureus NEGATIVE NEGATIVE Final    Comment: (NOTE) The Xpert SA Assay (FDA approved for NASAL specimens in patients 58 years of age and older), is one component of a comprehensive surveillance program. It is not intended to diagnose infection nor to guide or monitor treatment. Performed at [facility], [place] [address] [place], [place] [address]      Studies: Pelvis Portable  Result Date: 11/26/2019 CLINICAL DATA:  Postoperative evaluation. EXAM: PORTABLE PELVIS 1-2 VIEWS COMPARISON:  None. FINDINGS: A radiopaque intramedullary rod and compression screw device are seen within the proximal right femur. Acute inter trochanteric fracture of the proximal right femur is also seen with anatomic alignment. There is no evidence of dislocation. A mild-to-moderate amount of soft tissue air is seen along the lateral aspect of the proximal right lower extremity. IMPRESSION: Open reduction and internal fixation of the proximal right femur. Electronically Signed   By: Virgina Norfolk M.D.   On: 11/26/2019 17:13   DG Knee Right Port  Result Date: 11/26/2019 CLINICAL DATA:  Postoperative evaluation. EXAM: PORTABLE RIGHT KNEE - 1-2 VIEW COMPARISON:  None. FINDINGS: A radiopaque intramedullary rod is seen within the visualized portion of the right femoral shaft. A distal radiopaque fixation screw is also noted. No evidence of acute fracture, dislocation, or joint effusion. Mild arthropathy is seen involving the medial and lateral tibiofemoral compartment spaces. There is mild vascular calcification. IMPRESSION: Open reduction internal fixation of the proximal right femur. Electronically Signed   By: Virgina Norfolk M.D.   On: 11/26/2019 17:12   DG C-Arm 1-60 Min  Result Date: 11/26/2019 CLINICAL DATA:  Fracture status post ORIF EXAM: DG C-ARM 1-60 MIN FLUOROSCOPY TIME:   Fluoroscopy Time:  50 seconds Number of Acquired Spot Images: 5 COMPARISON:  November 26, 2019 FINDINGS: Patient has undergone intramedullary nail placement through the right femur. The osseous alignment is improved. The hardware is intact. There are expected postsurgical changes including subcutaneous gas and overlying soft tissue edema. IMPRESSION: Status post ORIF of right femur fracture. Electronically Signed  By: Constance Holster M.D.   On: 11/26/2019 17:53   DG HIP PORT UNILAT WITH PELVIS 1V RIGHT  Result Date: 11/26/2019 CLINICAL DATA:  Postoperative evaluation. EXAM: DG HIP (WITH OR WITHOUT PELVIS) 1V PORT RIGHT COMPARISON:  None. FINDINGS: A radiopaque intramedullary rod and compression screw device are seen within the proximal right femur. A nondisplaced fracture of the proximal right femoral shaft is seen, this extends to involve the lesser trochanter the proximal right femur. Mild-to-moderate severity arthropathy is noted within the right hip joint. IMPRESSION: Status post open reduction and internal fixation of the proximal right femur. Electronically Signed   By: Virgina Norfolk M.D.   On: 11/26/2019 17:59   DG FEMUR, MIN 2 VIEWS RIGHT  Result Date: 11/26/2019 CLINICAL DATA:  Femur fracture status post ORIF EXAM: RIGHT FEMUR 2 VIEWS COMPARISON:  November 26, 2019 FINDINGS: Patient has undergone intramedullary nail placement through the right femur. The osseous alignment is improved. The hardware is intact. There are expected postsurgical changes including subcutaneous gas and overlying soft tissue edema. IMPRESSION: Status post intramedullary nail placement through the right femur with improved osseous alignment. Electronically Signed   By: Constance Holster M.D.   On: 11/26/2019 17:52    Scheduled Meds: . acetaminophen  500 mg Oral Q8H  . acidophilus  1 capsule Oral Daily  . amLODipine  5 mg Oral Daily  . apixaban  5 mg Oral BID  . vitamin C  500 mg Oral Daily  . cholecalciferol   2,000 Units Oral BID  . docusate sodium  100 mg Oral BID  . donepezil  10 mg Oral Daily  . pantoprazole  40 mg Oral Daily  . senna  1 tablet Oral BID    Continuous Infusions:   Time spent: 26mns I have personally reviewed and interpreted on  11/27/2019 daily labs, tele strips, imagings as discussed above under date review session and assessment and plans.  I reviewed all nursing notes, pharmacy notes, consultant notes,  vitals, pertinent old records  I have discussed plan of care as described above with RN , patient and family on 11/27/2019   FFlorencia ReasonsMD, PhD, FACP  [facility]  Available via Epic secure chat 7am-7pm for nonurgent issues Please page for urgent issues, pager number available through [email or web address] .   11/27/2019, 7:40 AM  LOS: 1 day

## 2019-11-27 NOTE — Progress Notes (Signed)
Orthopaedic Trauma Service Progress Note  Patient ID: Susan Banks MRN: [id number] DOB/AGE: 1926/06/19 86 y.o.  Subjective:  Doing fair this am Moderate pain R hip  Trying to work with therapy, sitting on EOB, appears to have somewhat poor trunk stability currently   Does not dizziness  No SOB + Nausea but no vomiting  No tingling/numbness R leg    ROS As above  Objective:   VITALS:   Vitals:   11/26/19 2307 11/27/19 0343 11/27/19 0844 11/27/19 0845  BP: 120/67 (!) 113/58 (!) 133/59 (!) 147/63  Pulse: (!) 107 71  (!) 105  Resp:    14  Temp: 97.6 F (36.4 C) 98 F (36.7 C)  97.6 F (36.4 C)  TempSrc: Oral Oral  Oral  SpO2: 96% 92%  97%  Weight:      Height:        Estimated body mass index is 17.31 kg/m as calculated from the following:   Height as of this encounter: 5\' 5"  (1.651 m).   Weight as of this encounter: 47.2 kg.   Intake/Output      03/20 0701 - 03/21 0700 03/21 0701 - 03/22 0700   P.O. 0    I.V. (mL/kg) 1000 (21.2)    IV Piggyback 250    Total Intake(mL/kg) 1250 (26.5)    Urine (mL/kg/hr) 400 (0.4)    Blood 300    Total Output 700    Net +550         Urine Occurrence 300 x 1 x     LABS  Results for orders placed or performed during the hospital encounter of 11/26/19 (from the past 24 hour(s))  Urinalysis, Routine w reflex microscopic     Status: None   Collection Time: 11/26/19 12:24 PM  Result Value Ref Range   Color, Urine YELLOW YELLOW   APPearance CLEAR CLEAR   Specific Gravity, Urine 1.012 1.005 - 1.030   pH 8.0 5.0 - 8.0   Glucose, UA NEGATIVE NEGATIVE mg/dL   Hgb urine dipstick NEGATIVE NEGATIVE   Bilirubin Urine NEGATIVE NEGATIVE   Ketones, ur NEGATIVE NEGATIVE mg/dL   Protein, ur NEGATIVE NEGATIVE mg/dL   Nitrite NEGATIVE NEGATIVE   Leukocytes,Ua NEGATIVE NEGATIVE  Hemoglobin and hematocrit, blood     Status: Abnormal   Collection Time: 11/26/19   9:31 PM  Result Value Ref Range   Hemoglobin 8.6 (L) 12.0 - 15.0 g/dL   HCT 26.4 (L) 36.0 - 46.0 %  VITAMIN D 25 Hydroxy (Vit-D Deficiency, Fractures)     Status: None   Collection Time: 11/27/19  4:10 AM  Result Value Ref Range   Vit D, 25-Hydroxy 55.94 30 - 100 ng/mL  CBC     Status: Abnormal   Collection Time: 11/27/19  4:10 AM  Result Value Ref Range   WBC 12.5 (H) 4.0 - 10.5 K/uL   RBC 2.50 (L) 3.87 - 5.11 MIL/uL   Hemoglobin 7.6 (L) 12.0 - 15.0 g/dL   HCT 23.2 (L) 36.0 - 46.0 %   MCV 92.8 80.0 - 100.0 fL   MCH 30.4 26.0 - 34.0 pg   MCHC 32.8 30.0 - 36.0 g/dL   RDW 13.2 11.5 - 15.5 %   Platelets 153 150 - 400 K/uL   nRBC 0.0 0.0 - 0.2 %  Basic metabolic  panel     Status: Abnormal   Collection Time: 11/27/19  4:10 AM  Result Value Ref Range   Sodium 138 135 - 145 mmol/L   Potassium 5.1 3.5 - 5.1 mmol/L   Chloride 105 98 - 111 mmol/L   CO2 23 22 - 32 mmol/L   Glucose, Bld 174 (H) 70 - 99 mg/dL   BUN 21 8 - 23 mg/dL   Creatinine, Ser 1.11 (H) 0.44 - 1.00 mg/dL   Calcium 9.7 8.9 - 10.3 mg/dL   GFR calc non Af Amer 43 (L) >60 mL/min   GFR calc Af Amer 50 (L) >60 mL/min   Anion gap 10 5 - 15   Results for Susan Banks (MRN [id number]) as of 11/27/2019 09:53  Ref. Range 11/27/2019 04:10  Vitamin D, 25-Hydroxy Latest Ref Range: 30 - 100 ng/mL 55.94    PHYSICAL EXAM:   Gen: appears tired but NAD  Lungs: clear anterior fields Cardiac: tachy  Abd: + BS, NTND Ext:       Right Lower Extremity   Dressing c/d/i  Ext warm   + DP pulse  Minimal swelling   No DCT  Compartments are soft   DPN, SPN, TN sensation intact  EHL, FHL, Lesser toe motor intact  Ankle flexion, extension, inversion and eversion intact    Assessment/Plan: 1 Day Post-Op   Active Problems:   HTN (hypertension)   Chronic kidney disease, stage 3   GERD (gastroesophageal reflux disease)   Fracture, intertrochanteric, right femur (HCC)   Chronic anticoagulation   History of pulmonary embolus  (PE)   History of DVT (deep vein thrombosis)   Fall at home, initial encounter   Anti-infectives (From admission, onward)   Start     Dose/Rate Route Frequency Ordered Stop   11/27/19 0400  ceFAZolin (ANCEF) IVPB 2g/100 mL premix     2 g 200 mL/hr over 30 Minutes Intravenous Every 12 hours 11/26/19 1811 11/27/19 0447   11/26/19 1417  ceFAZolin (ANCEF) 2-4 GM/100ML-% IVPB    Note to Pharmacy: Susan Banks   : cabinet override      11/26/19 1417 11/27/19 0229   11/26/19 1000  cephALEXin (KEFLEX) capsule 250 mg     250 mg Oral 2 times daily 11/26/19 0724 11/26/19 2227    .  POD/HD#: 1  89 s/p fall with right hip fracture  - fall  -Right intertrochanteric hip fracture s/p IMN  Weight-bear as tolerated right leg  Range of motion as tolerated right hip and knee  PT and OT  Dressing change tomorrow or Tuesday  Ice as needed  TED hose   - Pain management:  Scheduled Tylenol  Norco as needed for breakthrough pain  Minimize IV narcotics  - ABL anemia/Hemodynamics  Patient did have moderate blood loss in surgery yesterday  Continue downward trend in her H&H    CBC Latest Ref Rng & Units 11/27/2019 11/26/2019 11/26/2019  WBC 4.0 - 10.5 K/uL 12.5(H) - -  Hemoglobin 12.0 - 15.0 g/dL 7.6(L) 8.6(L) 11.9(L)  Hematocrit 36.0 - 46.0 % 23.2(L) 26.4(L) 35.0(L)  Platelets 150 - 400 K/uL 153 - -    Patient also with symptoms: Dizziness and tachycardia  Will transfuse 2 units of packed red blood cells today  Cbc in am   - Medical issues   Per primary team   - DVT/PE prophylaxis:  On Eliquis chronically  TED hose   SCD L leg   - ID:   periop abx  -  Metabolic Bone Disease:  Vitamin D levels look good  Fracture still suggestive of metabolic bone disease: Osteoporosis  Recommend DEXA scan in outpatient setting in 6-8 weeks   Continue with home vitamin D dosing  - Activity:  Up with therapies  Weight-bear as tolerated right leg  - FEN/GI prophylaxis/Foley/Lines:  Regular diet  with thin liquids pending MBS  - Impediments to fracture healing:  Low-energy fracture suspicious for osteoporosis  - Dispo:  Continue with therapies  Blood transfusion today  Dispo still pending  Now it sounds like patient may not have 24-hour support at home as the patient's daughter needs to go away next week for work    Jari Pigg, PA-C [phone] (C) 11/27/2019, 9:44 AM  [facility] [place] [address] [phone] Domingo Sep (F)

## 2019-11-28 ENCOUNTER — Inpatient Hospital Stay (HOSPITAL_COMMUNITY): Payer: PPO

## 2019-11-28 ENCOUNTER — Encounter: Payer: Self-pay | Admitting: *Deleted

## 2019-11-28 LAB — BASIC METABOLIC PANEL
Anion gap: 9 (ref 5–15)
BUN: 21 mg/dL (ref 8–23)
CO2: 24 mmol/L (ref 22–32)
Calcium: 9.2 mg/dL (ref 8.9–10.3)
Chloride: 107 mmol/L (ref 98–111)
Creatinine, Ser: 1.07 mg/dL — ABNORMAL HIGH (ref 0.44–1.00)
GFR calc Af Amer: 52 mL/min — ABNORMAL LOW (ref >60)
GFR calc non Af Amer: 45 mL/min — ABNORMAL LOW (ref >60)
Glucose, Bld: 109 mg/dL — ABNORMAL HIGH (ref 70–99)
Potassium: 4.2 mmol/L (ref 3.5–5.1)
Sodium: 140 mmol/L (ref 135–145)

## 2019-11-28 LAB — TYPE AND SCREEN
ABO/RH(D): A POS
Antibody Screen: NEGATIVE
Unit division: 0
Unit division: 0

## 2019-11-28 LAB — BPAM RBC
Blood Product Expiration Date: 202104182359
Blood Product Expiration Date: 202104182359
ISSUE DATE / TIME: 202103211136
ISSUE DATE / TIME: 202103211508
Unit Type and Rh: 6200
Unit Type and Rh: 6200

## 2019-11-28 LAB — CBC
HCT: 31.4 % — ABNORMAL LOW (ref 36.0–46.0)
Hemoglobin: 10.5 g/dL — ABNORMAL LOW (ref 12.0–15.0)
MCH: 30.1 pg (ref 26.0–34.0)
MCHC: 33.4 g/dL (ref 30.0–36.0)
MCV: 90 fL (ref 80.0–100.0)
Platelets: 121 10*3/uL — ABNORMAL LOW (ref 150–400)
RBC: 3.49 MIL/uL — ABNORMAL LOW (ref 3.87–5.11)
RDW: 14.1 % (ref 11.5–15.5)
WBC: 15.1 10*3/uL — ABNORMAL HIGH (ref 4.0–10.5)
nRBC: 0 % (ref 0.0–0.2)

## 2019-11-28 MED ORDER — HYDROCODONE-ACETAMINOPHEN 5-325 MG PO TABS
1.0000 | ORAL_TABLET | ORAL | Status: DC | PRN
  Administered 2019-11-28 (×2): 1 via ORAL
  Administered 2019-11-29: 2 via ORAL
  Administered 2019-11-29: 1 via ORAL
  Administered 2019-11-29: 2 via ORAL
  Administered 2019-11-30: 1 via ORAL
  Filled 2019-11-28: qty 2
  Filled 2019-11-28: qty 1
  Filled 2019-11-28: qty 2
  Filled 2019-11-28 (×2): qty 1
  Filled 2019-11-28: qty 2
  Filled 2019-11-28: qty 1
  Filled 2019-11-28: qty 2

## 2019-11-28 NOTE — Progress Notes (Signed)
Pt worked with PT who performed an orthostatic vitals test with pt. PT reports a negative result for orthostatic vitals, reported BP stayed stable. Will continue to monitor.

## 2019-11-28 NOTE — Progress Notes (Signed)
Orthopaedic Trauma Service Progress Note  Patient ID: Susan Banks MRN: [id number] DOB/AGE: [birth year] 86 y.o.  Subjective:  Feeling a little better today Felt better after getting 2 units of PRBCs yesterday Waiting to work with PT/OT today   MBS completed: dysphagia 3 diet- mech soft diet, thin liquids   No CP or SOB No lightheadedness No abd pain    ROS As above  Objective:   VITALS:   Vitals:   11/27/19 1754 11/27/19 1956 11/28/19 0411 11/28/19 0755  BP: (!) 141/69 123/70 116/75 (!) 142/60  Pulse: 92 90 75 72  Resp: 16   17  Temp: 98.1 F (36.7 C) 98.2 F (36.8 C) 98.3 F (36.8 C) 98.3 F (36.8 C)  TempSrc: Oral Oral Oral Oral  SpO2: 98% 97% 96% 94%  Weight:      Height:        Estimated body mass index is 17.31 kg/m as calculated from the following:   Height as of this encounter: 5\' 5"  (1.651 m).   Weight as of this encounter: 47.2 kg.   Intake/Output      03/21 0701 - 03/22 0700 03/22 0701 - 03/23 0700   P.O. 360    I.V. (mL/kg) 748.7 (15.9)    Blood 630    IV Piggyback     Total Intake(mL/kg) 1738.7 (36.8)    Urine (mL/kg/hr) 2800 (2.5)    Blood     Total Output 2800    Net -1061.3         Urine Occurrence 3 x      LABS  Results for orders placed or performed during the hospital encounter of 11/26/19 (from the past 24 hour(s))  CBC     Status: Abnormal   Collection Time: 11/28/19  2:15 AM  Result Value Ref Range   WBC 15.1 (H) 4.0 - 10.5 K/uL   RBC 3.49 (L) 3.87 - 5.11 MIL/uL   Hemoglobin 10.5 (L) 12.0 - 15.0 g/dL   HCT 31.4 (L) 36.0 - 46.0 %   MCV 90.0 80.0 - 100.0 fL   MCH 30.1 26.0 - 34.0 pg   MCHC 33.4 30.0 - 36.0 g/dL   RDW 14.1 11.5 - 15.5 %   Platelets 121 (L) 150 - 400 K/uL   nRBC 0.0 0.0 - 0.2 %  Basic metabolic panel     Status: Abnormal   Collection Time: 11/28/19  2:15 AM  Result Value Ref Range   Sodium 140 135 - 145 mmol/L   Potassium 4.2  3.5 - 5.1 mmol/L   Chloride 107 98 - 111 mmol/L   CO2 24 22 - 32 mmol/L   Glucose, Bld 109 (H) 70 - 99 mg/dL   BUN 21 8 - 23 mg/dL   Creatinine, Ser 1.07 (H) 0.44 - 1.00 mg/dL   Calcium 9.2 8.9 - 10.3 mg/dL   GFR calc non Af Amer 45 (L) >60 mL/min   GFR calc Af Amer 52 (L) >60 mL/min   Anion gap 9 5 - 15     PHYSICAL EXAM:   Gen: NAD, sitting up in bed, appears well Lungs: unlabored Cardiac: reg Ext:       Right Lower Extremity              Dressing c/d/i  Ext warm              + DP pulse             Minimal swelling              No DCT             Compartments are soft              DPN, SPN, TN sensation intact             EHL, FHL, Lesser toe motor intact             Ankle flexion, extension, inversion and eversion intact  Assessment/Plan: 2 Days Post-Op   Active Problems:   HTN (hypertension)   Chronic kidney disease, stage 3   GERD (gastroesophageal reflux disease)   Fracture, intertrochanteric, right femur (HCC)   Chronic anticoagulation   History of pulmonary embolus (PE)   History of DVT (deep vein thrombosis)   Fall at home, initial encounter   Anti-infectives (From admission, onward)   Start     Dose/Rate Route Frequency Ordered Stop   11/27/19 0400  ceFAZolin (ANCEF) IVPB 2g/100 mL premix     2 g 200 mL/hr over 30 Minutes Intravenous Every 12 hours 11/26/19 1811 11/27/19 0447   11/26/19 1417  ceFAZolin (ANCEF) 2-4 GM/100ML-% IVPB    Note to Pharmacy: Henrine Screws   : cabinet override      11/26/19 1417 11/27/19 0229   11/26/19 1000  cephALEXin (KEFLEX) capsule 250 mg     250 mg Oral 2 times daily 11/26/19 0724 11/26/19 2227    .  POD/HD#: 2  89 s/p fall with right hip fracture   - fall   -Right intertrochanteric hip fracture s/p IMN             Weight-bear as tolerated right leg             Range of motion as tolerated right hip and knee             PT and OT             Dressing change tomorrow or Tuesday             Ice as  needed             TED hose    - Pain management:             Scheduled Tylenol             Norco as needed for breakthrough pain             Minimize IV narcotics   - ABL anemia/Hemodynamics             improved after 2 units  Monitor   Cbc in am   - Medical issues              Per primary team              - DVT/PE prophylaxis:             On Eliquis chronically dose decreased to 2.5 mg daily   PE/DVT was in 2018 and dc summary recommended 6 months of treatment     Was PE considered unprovoked?    Pt is a fall risk with her advancing age     ? Discuss with prescribing provider about continuation  TED hose              SCD L leg    - ID:              periop abx completed    - Metabolic Bone Disease:             Vitamin D levels look good             Fracture still suggestive of metabolic bone disease: Osteoporosis             Recommend DEXA scan in outpatient setting in 6-8 weeks              Continue with home vitamin D dosing   - Activity:             Up with therapies             Weight-bear as tolerated right leg   - FEN/GI prophylaxis/Foley/Lines:             Regular diet with thin liquids pending MBS   - Impediments to fracture healing:             Low-energy fracture suspicious for osteoporosis   - Dispo:             Continue with therapies             CIR consult placed      Jari Pigg, PA-C [phone] (C) 11/28/2019, 12:20 PM  [facility] [place] [address] [phone] Domingo Sep (F)

## 2019-11-28 NOTE — Progress Notes (Signed)
Physical Therapy Treatment Patient Details Name: Susan Banks MRN: [id number] DOB: 09-Mar-1926 Today's Date: 11/28/2019    History of Present Illness Susan Banks is a 86 y.o. female with medical history significant of essential hypertension, hyperlipidemia, pulmonary embolus in 2018 on Eliquis, chronic kidney disease stage III, history of C. difficile, mild dementia, and GERD presents after having fall at home.  PTA pt reports completing most ADLs without assistance and ambulated without AD. Imaging demonstrated R hip fracture. Pt has lots 7 pounds since December. Pt underwent ORIF R hip 11/26/19.     PT Comments    Pt demonstrates continued improvement with PT mobility. Orthostatic BP taken during transfer today per MD request (see bold below narrative). Pt continues to complain of dizziness but mostly nausea limitations today, which improves with sitting/supine following transfers. Pt demonstrated foot shuffling during transfer with increased speed compared to morning session, with reduced nausea (no dry heaving). Improved standing posture, but continues to require cues during sitting.  Pt has good carry over requiring intermittent VCs for sequencing with transfers requiring more assistance from lower surface height today. PT will continue following acutely until transfer to next level of care.    11/28/19 1500  Orthostatic Sitting  BP-  115/68  Pulse-  87  Orthostatic Standing at 0 minutes  BP-  135/77  Pulse-  113  Orthostatic Sitting after transfer  BP-  135/90  Pulse-  79  Oxygen Therapy  SpO2 94 %  O2 Device Room Air     Follow Up Recommendations  CIR     Equipment Recommendations  Other (comment)    Recommendations for Other Services Rehab consult     Precautions / Restrictions Precautions Precautions: Anterior Hip Precaution Booklet Issued: No Restrictions Weight Bearing Restrictions: Yes RLE Weight Bearing: Weight bearing as tolerated    Mobility  Bed  Mobility Overal bed mobility: Needs Assistance Bed Mobility: Sit to Supine     Supine to sit: Mod assist;Min assist;HOB elevated Sit to supine: Mod assist   General bed mobility comments: modA for LE management and guidance into bed.  Transfers Overall transfer level: Needs assistance Equipment used: Rolling walker (2 wheeled) Transfers: Sit to/from [facility] Sit to Stand: Mod assist Stand pivot transfers: Mod assist       General transfer comment: Pt demonstrated increased need for assistance from lowered surface height requiring modA at L hip for improved stand transfer, continued cueing required prior to mobility for improved body mechanics and sequencing     Balance Overall balance assessment: Needs assistance Sitting-balance support: Single extremity supported;Feet supported Sitting balance-Leahy Scale: Fair Sitting balance - Comments: Required heavy LUE for balance initially, but able to progress to minimal UE usage. Limited primairily due to fatigue today. Postural control: Left lateral lean Standing balance support: Bilateral upper extremity supported Standing balance-Leahy Scale: Poor         Cognition Arousal/Alertness: Awake/alert Behavior During Therapy: WFL for tasks assessed/performed Overall Cognitive Status: History of cognitive impairments - at baseline             General Comments: Pt continued to be agreeable to therapy to get back to bed. Minimal memory issues noted today.         General Comments General comments (skin integrity, edema, etc.): VSS throughout tx with SPO2 >94% following transfer on RA. Unhooked SPO2 monitoring through transfers for cord management and patient safety. BP taken multiple times through transfers.       Pertinent Vitals/Pain Pain  Assessment: Faces Faces Pain Scale: Hurts even more Pain Location: right hip Pain Descriptors / Indicators: Grimacing;Guarding Pain Intervention(s): Monitored during  session;Repositioned;Ice applied           PT Goals (current goals can now be found in the care plan section) Acute Rehab PT Goals Patient Stated Goal: Back to bed to rest PT Goal Formulation: With patient/family Time For Goal Achievement: 12/11/19 Potential to Achieve Goals: Good Progress towards PT goals: Progressing toward goals    Frequency    Min 5X/week      PT Plan Current plan remains appropriate       AM-PAC PT "6 Clicks" Mobility   Outcome Measure  Help needed turning from your back to your side while in a flat bed without using bedrails?: A Lot Help needed moving from lying on your back to sitting on the side of a flat bed without using bedrails?: A Lot Help needed moving to and from a bed to a chair (including a wheelchair)?: A Lot Help needed standing up from a chair using your arms (e.g., wheelchair or bedside chair)?: A Lot Help needed to walk in hospital room?: Total Help needed climbing 3-5 steps with a railing? : Total 6 Click Score: 10    End of Session Equipment Utilized During Treatment: Gait belt Activity Tolerance: Patient limited by fatigue;Treatment limited secondary to medical complications (Comment) Patient left: with SCD's reapplied;with call bell/phone within reach;in bed;with bed alarm set;with family/visitor present Nurse Communication: Mobility status;Need for lift equipment;Other (comment) PT Visit Diagnosis: Unsteadiness on feet (R26.81);Muscle weakness (generalized) (M62.81);History of falling (Z91.81);Difficulty in walking, not elsewhere classified (R26.2)     Time: [id number] PT Time Calculation (min) (ACUTE ONLY): 37 min  Charges:  $Therapeutic Activity: 23-37 mins                     Ann Held PT, DPT Acute Rehab [facility] P: [phone]    Lenwood Balsam A Rushawn Capshaw 11/28/2019, 3:12 PM

## 2019-11-28 NOTE — Progress Notes (Addendum)
PROGRESS NOTE    Susan Banks  [id number]  DOB: 1926/03/06  PCP: Kelton Pillar, MD Admit date:11/26/2019  86 y.o.femalewith medical history significant ofessential hypertension, hyperlipidemia, pulmonary embolus in 2018 on Eliquis, chronic kidney disease stage III,history of C. difficile, mild dementia, and GERD presented to ED from home after a fall.  Found to have right anterior trochanteric hip fracture, status post IM and on March 20, recieved blood transfusion 3/21 for postop anemia, family prefers CIR placement as daughter has to work, unable to provide 24 hr supervision.   Subjective:  Patient sitting in bedside chair in no acute distress.  Daughter at bedside.  According to daughter patient had been feeling wobbly on change of positions but denies any spinning sensation or vertigo.  She currently has support stockings in place, denies being diagnosed with orthostatic hypotension.  She apparently had hyponatremia with diuretic/antihypertensives in the past, hence changed to amlodipine over the last year.  Has memory issues but currently oriented x3 and quite communicative.  Objective: Vitals:   11/27/19 1754 11/27/19 1956 11/28/19 0411 11/28/19 0755  BP: (!) 141/69 123/70 116/75 (!) 142/60  Pulse: 92 90 75 72  Resp: 16   17  Temp: 98.1 F (36.7 C) 98.2 F (36.8 C) 98.3 F (36.8 C) 98.3 F (36.8 C)  TempSrc: Oral Oral Oral Oral  SpO2: 98% 97% 96% 94%  Weight:      Height:        Intake/Output Summary (Last 24 hours) at 11/28/2019 1132 Last data filed at 11/28/2019 0400 Gross per 24 hour  Intake 1738.7 ml  Output 2800 ml  Net -1061.3 ml   Filed Weights   11/26/19 0359  Weight: 47.2 kg    Physical Examination:  General exam: Appears calm and comfortable  Respiratory system: Clear to auscultation. Respiratory effort normal. Cardiovascular system: S1 & S2 heard, RRR. No JVD, murmurs, rubs, gallops or clicks. No pedal edema. Gastrointestinal system: Abdomen  is nondistended, soft and nontender. Normal bowel sounds heard. Central nervous system: Alert and oriented. No new focal neurological deficits. Extremities: No contractures, edema or joint deformities.  Skin: No rashes, lesions or ulcers Psychiatry: Judgement and insight appear normal. Mood & affect appropriate.   Data Reviewed: I have personally reviewed following labs and imaging studies  CBC: Recent Labs  Lab 11/26/19 0403 11/26/19 0441 11/26/19 2131 11/27/19 0410 11/28/19 0215  WBC 7.9  --   --  12.5* 15.1*  NEUTROABS 5.4  --   --   --   --   HGB 12.0 11.9* 8.6* 7.6* 10.5*  HCT 36.9 35.0* 26.4* 23.2* 31.4*  MCV 92.0  --   --  92.8 90.0  PLT 220  --   --  153 [id number]*   Basic Metabolic Panel: Recent Labs  Lab 11/26/19 0441 11/27/19 0410 11/28/19 0215  NA 139 138 140  K 3.8 5.1 4.2  CL 105 105 107  CO2  --  23 24  GLUCOSE 114* 174* 109*  BUN 26* 21 21  CREATININE 1.00 1.11* 1.07*  CALCIUM  --  9.7 9.2   GFR: Estimated Creatinine Clearance: 25 mL/min (A) (by C-G formula based on SCr of 1.07 mg/dL (H)). Liver Function Tests: No results for input(s): AST, ALT, ALKPHOS, BILITOT, PROT, ALBUMIN in the last 168 hours. No results for input(s): LIPASE, AMYLASE in the last 168 hours. No results for input(s): AMMONIA in the last 168 hours. Coagulation Profile: No results for input(s): INR, PROTIME in the last 168 hours.  Cardiac Enzymes: No results for input(s): CKTOTAL, CKMB, CKMBINDEX, TROPONINI in the last 168 hours. BNP (last 3 results) No results for input(s): PROBNP in the last 8760 hours. HbA1C: No results for input(s): HGBA1C in the last 72 hours. CBG: No results for input(s): GLUCAP in the last 168 hours. Lipid Profile: No results for input(s): CHOL, HDL, LDLCALC, TRIG, CHOLHDL, LDLDIRECT in the last 72 hours. Thyroid Function Tests: No results for input(s): TSH, T4TOTAL, FREET4, T3FREE, THYROIDAB in the last 72 hours. Anemia Panel: No results for input(s):  VITAMINB12, FOLATE, FERRITIN, TIBC, IRON, RETICCTPCT in the last 72 hours. Sepsis Labs: No results for input(s): PROCALCITON, LATICACIDVEN in the last 168 hours.  Recent Results (from the past 240 hour(s))  Respiratory Panel by RT PCR (Flu A&B, Covid) - Nasopharyngeal Swab     Status: None   Collection Time: 11/26/19  4:13 AM   Specimen: Nasopharyngeal Swab  Result Value Ref Range Status   SARS Coronavirus 2 by RT PCR NEGATIVE NEGATIVE Final    Comment: (NOTE) SARS-CoV-2 target nucleic acids are NOT DETECTED. The SARS-CoV-2 RNA is generally detectable in upper respiratoy specimens during the acute phase of infection. The lowest concentration of SARS-CoV-2 viral copies this assay can detect is 131 copies/mL. A negative result does not preclude SARS-Cov-2 infection and should not be used as the sole basis for treatment or other patient management decisions. A negative result may occur with  improper specimen collection/handling, submission of specimen other than nasopharyngeal swab, presence of viral mutation(s) within the areas targeted by this assay, and inadequate number of viral copies (<131 copies/mL). A negative result must be combined with clinical observations, patient history, and epidemiological information. The expected result is Negative. Fact Sheet for Patients:  PinkCheek.be Fact Sheet for Healthcare Providers:  GravelBags.it This test is not yet ap proved or cleared by the [place] FDA and  has been authorized for detection and/or diagnosis of SARS-CoV-2 by FDA under an Emergency Use Authorization (EUA). This EUA will remain  in effect (meaning this test can be used) for the duration of the COVID-19 declaration under Section 564(b)(1) of the Act, 21 U.S.C. section 360bbb-3(b)(1), unless the authorization is terminated or revoked sooner.    Influenza A by PCR NEGATIVE NEGATIVE Final   Influenza B by PCR  NEGATIVE NEGATIVE Final    Comment: (NOTE) The Xpert Xpress SARS-CoV-2/FLU/RSV assay is intended as an aid in  the diagnosis of influenza from Nasopharyngeal swab specimens and  should not be used as a sole basis for treatment. Nasal washings and  aspirates are unacceptable for Xpert Xpress SARS-CoV-2/FLU/RSV  testing. Fact Sheet for Patients: PinkCheek.be Fact Sheet for Healthcare Providers: GravelBags.it This test is not yet approved or cleared by the [place] FDA and  has been authorized for detection and/or diagnosis of SARS-CoV-2 by  FDA under an Emergency Use Authorization (EUA). This EUA will remain  in effect (meaning this test can be used) for the duration of the  Covid-19 declaration under Section 564(b)(1) of the Act, 21  U.S.C. section 360bbb-3(b)(1), unless the authorization is  terminated or revoked. Performed at [facility], [place] [address], [place], [place] [address]   Surgical pcr screen     Status: None   Collection Time: 11/26/19  8:58 AM   Specimen: Nasal Mucosa; Nasal Swab  Result Value Ref Range Status   MRSA, PCR NEGATIVE NEGATIVE Final   Staphylococcus aureus NEGATIVE NEGATIVE Final    Comment: (NOTE) The Xpert SA Assay (FDA  approved for NASAL specimens in patients 24 years of age and older), is one component of a comprehensive surveillance program. It is not intended to diagnose infection nor to guide or monitor treatment. Performed at [facility], [place] [address] [place], [place] [address]       Radiology Studies: Pelvis Portable  Result Date: 11/26/2019 CLINICAL DATA:  Postoperative evaluation. EXAM: PORTABLE PELVIS 1-2 VIEWS COMPARISON:  None. FINDINGS: A radiopaque intramedullary rod and compression screw device are seen within the proximal right femur. Acute inter trochanteric fracture of the proximal right femur is also seen with anatomic alignment. There is no evidence  of dislocation. A mild-to-moderate amount of soft tissue air is seen along the lateral aspect of the proximal right lower extremity. IMPRESSION: Open reduction and internal fixation of the proximal right femur. Electronically Signed   By: Virgina Norfolk M.D.   On: 11/26/2019 17:13   DG Knee Right Port  Result Date: 11/26/2019 CLINICAL DATA:  Postoperative evaluation. EXAM: PORTABLE RIGHT KNEE - 1-2 VIEW COMPARISON:  None. FINDINGS: A radiopaque intramedullary rod is seen within the visualized portion of the right femoral shaft. A distal radiopaque fixation screw is also noted. No evidence of acute fracture, dislocation, or joint effusion. Mild arthropathy is seen involving the medial and lateral tibiofemoral compartment spaces. There is mild vascular calcification. IMPRESSION: Open reduction internal fixation of the proximal right femur. Electronically Signed   By: Virgina Norfolk M.D.   On: 11/26/2019 17:12   DG Swallowing Func-Speech Pathology  Result Date: 11/28/2019 Objective Swallowing Evaluation: Type of Study: MBS-Modified Barium Swallow Study  Patient Details Name: Susan Banks MRN: [id number] Date of Birth: [birth year] Today's Date: 11/28/2019 Time: SLP Start Time (ACUTE ONLY): [id number] -SLP Stop Time (ACUTE ONLY): 0850 SLP Time Calculation (min) (ACUTE ONLY): 15 min Past Medical History: Past Medical History: Diagnosis Date . C. difficile diarrhea  . Chronic anticoagulation  . Chronic kidney disease  . Constipation  . GERD (gastroesophageal reflux disease)  . Hypercholesteremia  . Hypertension  . Left leg DVT ([place]) 08/2017 . Pulmonary embolism ([place]) 08/2017 . Shortness of breath  Past Surgical History: Past Surgical History: Procedure Laterality Date . ABDOMINAL SURGERY   . ESOPHAGOGASTRODUODENOSCOPY N/A 07/04/2014  Procedure: ESOPHAGOGASTRODUODENOSCOPY (EGD);  Surgeon: Laverda Page, MD;  Location: [facility];  Service: Cardiovascular;  Laterality: N/A; . TEE WITHOUT CARDIOVERSION N/A  06/13/2014  Procedure: TRANSESOPHAGEAL ECHOCARDIOGRAM (TEE);  Surgeon: Laverda Page, MD;  Location: [facility];  Service: Cardiovascular;  Laterality: N/A; HPI: RASHIDA DEVON is a 86 y.o. female with medical history significant of essential hypertension, hyperlipidemia, pulmonary embolus in 2018 on Eliquis, chronic kidney disease stage III, history of C. difficile, mild dementia, and GERD presents after having fall at home.  History is obtained from the patient with assistance from her daughter present at bedside.  She lives at home with her husband and is able to complete most of her ADLs without assistance.  She does not use a walker or cane.  This morning around 2 AM she had gone to use the bathroom when she states that her legs gave out on her.  She did not hit her head or lose consciousness and reported having severe pain in the right hip area.  Patient was not able to ambulate.  She had been seen recently by her primary care provider 7 days ago and diagnosed with a UTI.  They started her on 7-day course of cephalexin, but was reported that cultures did not  grow out much.  Patient has also not been eating or drinking much, and has had about a 7 pound weight loss since December.  Patient had also been question to possibly be having vertigo, but symptoms also were possibly related to dehydration.  Other associated symptoms include nausea, some constipation, and insomnia.  ED Course: Upon admission into the emergency department patient was found to be mildly tachypneic with all other vital signs relatively within normal limits.  Labs were relatively unremarkable.  X-rays of the right hip revealed an acute right intertrochanteric femur fracture.  She was given fentanyl for pain.  Most recent chest xray was showing no active disease.  Subjective: pt upright in flouro chair Assessment / Plan / Recommendation CHL IP CLINICAL IMPRESSIONS 11/28/2019 Clinical Impression Patient presents with functional  oropharyngeal swallow and known cervical esophageal dysphagia- Zenker's diverticulum. Decreased UES relaxation and Zenker's results in backflow of barium into pharynx - with inconsistent pt awareness.  Liquid swallows following solids does not improve clearance and increases backflow.  Dry swallows (pt states she needs to conduct) are helpful to decrease residuals.  No aspiration/penetration of any consistency tested noted but she is at risk with her Zenker's.  At end of testing, pt denies sensation of residuals however minimal retention continued.  Advised she needs her pills to be crushed and to conduct dry swallows with each bolus.  Educated pt to her lack of sensation to minimal retention remained - and advised she consume small amounts t/o the day.  Informed pt and her daughter Susan Banks to her increased asp pna risk if she does aspirate and advised that caution with po is indicated.  Concern for pt's adequacy of nutrition given her effort with po intake - thus advised maximize Ensure, liquid nutrition as much as able.  Suspect backflow of po is causing pt to become "choked".   Upon esophageal sweep, pt appeared clear- no radiologist present to confirm. SLP Visit Diagnosis Dysphagia, unspecified (R13.10) Attention and concentration deficit following -- Frontal lobe and executive function deficit following -- Impact on safety and function Moderate aspiration risk   CHL IP TREATMENT RECOMMENDATION 11/28/2019 Treatment Recommendations No treatment recommended at this time   Prognosis 11/28/2019 Prognosis for Safe Diet Advancement Good Barriers to Reach Goals Time post onset Barriers/Prognosis Comment -- CHL IP DIET RECOMMENDATION 11/28/2019 SLP Diet Recommendations Dysphagia 3 (Mech soft) solids;Thin liquid Liquid Administration via Cup;Straw Medication Administration Other (Comment) Compensations Slow rate;Small sips/bites Postural Changes Remain semi-upright after after feeds/meals (Comment);Seated upright at 90 degrees    CHL IP OTHER RECOMMENDATIONS 11/28/2019 Recommended Consults -- Oral Care Recommendations Oral care BID Other Recommendations --   CHL IP FOLLOW UP RECOMMENDATIONS 11/28/2019 Follow up Recommendations None   No flowsheet data found.     CHL IP ORAL PHASE 11/28/2019 Oral Phase WFL Oral - Pudding Teaspoon -- Oral - Pudding Cup -- Oral - Honey Teaspoon -- Oral - Honey Cup -- Oral - Nectar Teaspoon -- Oral - Nectar Cup -- Oral - Nectar Straw WFL Oral - Thin Teaspoon -- Oral - Thin Cup WFL Oral - Thin Straw WFL Oral - Puree WFL Oral - Mech Soft WFL Oral - Regular -- Oral - Multi-Consistency -- Oral - Pill -- Oral Phase - Comment --  CHL IP PHARYNGEAL PHASE 11/28/2019 Pharyngeal Phase -- Pharyngeal- Pudding Teaspoon -- Pharyngeal -- Pharyngeal- Pudding Cup -- Pharyngeal -- Pharyngeal- Honey Teaspoon -- Pharyngeal -- Pharyngeal- Honey Cup -- Pharyngeal -- Pharyngeal- Nectar Teaspoon WFL Pharyngeal Material does not enter  airway Pharyngeal- Nectar Cup [facility] Pharyngeal Material does not enter airway Pharyngeal- Nectar Straw WFL Pharyngeal Material does not enter airway Pharyngeal- Thin Teaspoon NT Pharyngeal -- Pharyngeal- Thin Cup [facility] Pharyngeal Material does not enter airway Pharyngeal- Thin Straw WFL Pharyngeal Material does not enter airway Pharyngeal- Puree WFL Pharyngeal Material does not enter airway Pharyngeal- Mechanical Soft WFL Pharyngeal Material does not enter airway Pharyngeal- Regular -- Pharyngeal -- Pharyngeal- Multi-consistency -- Pharyngeal -- Pharyngeal- Pill -- Pharyngeal -- Pharyngeal Comment --  CHL IP CERVICAL ESOPHAGEAL PHASE 11/28/2019 Cervical Esophageal Phase Impaired Pudding Teaspoon -- Pudding Cup -- Honey Teaspoon -- Honey Cup -- Nectar Teaspoon -- Nectar Cup Reduced cricopharyngeal relaxation;Esophageal backflow into the pharynx;Prominent cricopharyngeal segment Nectar Straw Reduced cricopharyngeal relaxation;Esophageal backflow into the pharynx;Prominent cricopharyngeal segment Thin Teaspoon NT  Thin Cup Reduced cricopharyngeal relaxation;Esophageal backflow into the pharynx;Prominent cricopharyngeal segment Thin Straw Reduced cricopharyngeal relaxation;Esophageal backflow into the pharynx;Prominent cricopharyngeal segment Puree Reduced cricopharyngeal relaxation;Prominent cricopharyngeal segment Mechanical Soft Reduced cricopharyngeal relaxation;Prominent cricopharyngeal segment Regular -- Multi-consistency -- Pill -- Cervical Esophageal Comment Pt with known Zenker's diverticulum resulting in backflow of barium into pharynx, liquid swallows following solids does not improve clearance and increases backflow.  Dry swallows (pt states she needs to conduct) are helpful to decrease residuals.  At end of testing, pt denies sensation of residuals however she appeared with some retention continued. Macario Golds 11/28/2019, 9:33 AM      Kathleen Lime, MS [facility] SLP Acute Rehab Services Office [phone]         DG C-Arm 1-60 Min  Result Date: 11/26/2019 CLINICAL DATA:  Fracture status post ORIF EXAM: DG C-ARM 1-60 MIN FLUOROSCOPY TIME:  Fluoroscopy Time:  50 seconds Number of Acquired Spot Images: 5 COMPARISON:  November 26, 2019 FINDINGS: Patient has undergone intramedullary nail placement through the right femur. The osseous alignment is improved. The hardware is intact. There are expected postsurgical changes including subcutaneous gas and overlying soft tissue edema. IMPRESSION: Status post ORIF of right femur fracture. Electronically Signed   By: Constance Holster M.D.   On: 11/26/2019 17:53   DG HIP PORT UNILAT WITH PELVIS 1V RIGHT  Result Date: 11/26/2019 CLINICAL DATA:  Postoperative evaluation. EXAM: DG HIP (WITH OR WITHOUT PELVIS) 1V PORT RIGHT COMPARISON:  None. FINDINGS: A radiopaque intramedullary rod and compression screw device are seen within the proximal right femur. A nondisplaced fracture of the proximal right femoral shaft is seen, this extends to involve the lesser trochanter the proximal  right femur. Mild-to-moderate severity arthropathy is noted within the right hip joint. IMPRESSION: Status post open reduction and internal fixation of the proximal right femur. Electronically Signed   By: Virgina Norfolk M.D.   On: 11/26/2019 17:59   DG FEMUR, MIN 2 VIEWS RIGHT  Result Date: 11/26/2019 CLINICAL DATA:  Femur fracture status post ORIF EXAM: RIGHT FEMUR 2 VIEWS COMPARISON:  November 26, 2019 FINDINGS: Patient has undergone intramedullary nail placement through the right femur. The osseous alignment is improved. The hardware is intact. There are expected postsurgical changes including subcutaneous gas and overlying soft tissue edema. IMPRESSION: Status post intramedullary nail placement through the right femur with improved osseous alignment. Electronically Signed   By: Constance Holster M.D.   On: 11/26/2019 17:52        Scheduled Meds: . acetaminophen  500 mg Oral Q8H  . acidophilus  1 capsule Oral Daily  . amLODipine  5 mg Oral Daily  . apixaban  2.5 mg Oral BID  . vitamin C  500 mg Oral Daily  . cholecalciferol  2,000 Units Oral BID  . docusate sodium  100 mg Oral BID  . donepezil  10 mg Oral Daily  . lidocaine  1 patch Transdermal Q24H  . pantoprazole  40 mg Oral Daily  . polyethylene glycol  17 g Oral Daily  . senna  1 tablet Oral BID   Continuous Infusions: . sodium chloride 75 mL/hr at 11/28/19 0500    Assessment/Plan: Right intertrochanteric hip fracture - Seen by Ortho, Status post IMN on 11/26/19-Weightbearing as tolerated to right leg. Continue vitamin D supplement home dosing, Ortho recommend DEXA scan in outpatient setting in 6 to 8 weeks.  Mechanical fall: Daughter reports that patient had been complaining of feeling wobbly with no balance especially with changing positions (supine to sitting/standing).  Requested physical therapy to check orthostatics-sitting 115/68 with pulse 87, standing 135/77 with pulse 113.  Continue support stockings.  Discussed with  patient and daughter regarding benefits and risks of of continuing and discontinuing anticoagulation.  Given isolated fall episode and patient's ability to catch herself, they would like to continue anticoagulation for now to mitigate thromboembolism risk.  Postoperative acute blood loss/dilutional anemia- Recieved PRBC transfusion on 3/21 for Hgb drop 12-->8.6-->7.6. Now improved to 10.5.   History of PE and DVT on chronic anticoagulation with [facility] to resume Eliquis, accordingly resumed Eliquis at lowered dose 2.5 mg BID due to age and weight. Monitor for hgb stability  Recent urinary tract infection: Patient diagnosed with a urinary tract infection and had been started on cephalexin in the outpatient setting on 3/13. Patient does not report any active symptoms.  Resumed cephalexin while here to complete course-UA unremarkable on admission  Leucocytosis: likely reactive. Not POA but uptrend post op. Afebrile, u/a -VE.  Dehydration, poor appetite: Patient reporting not eating and drinking much. Received gentle hydration for 24 hours, encourage oral intake.  CKD 3a stable at baseline, renal dosing meds  Dementia: Patient has trouble with short-term memory, but long-term memory intact. She is on Aricept 10 mg daily.Not oriented to the year, oriented to month place and person.Continue Aricept  Dysphagia: Patient reported issues taking a pill, stuck in her throat leaving a bitter taste and slight discomfort-Speech eval, to have modified barium study tomorrow  Chronic low back pain, K pad  Constipation: Stool softener    DVT prophylaxis: On oral anticoagulation Code Status: Full code Family / Patient Communication: Discussed with patient and daughter at bedside, all questions answered to satisfaction Disposition Plan:   Patient is from home prior to hospitalization. Received/Receiving inpatient care for acute hip fracture/post op anemia in the setting of  chrobnic anticoagulation Discharge when safe disposition option available (Inpatient/SNF rehab)     LOS: 2 days    Time spent: 71 min   Guilford Shi, MD [facility] Pager in [place]  If 7PM-7AM, please contact night-coverage [email or web address] 11/28/2019, 11:32 AM

## 2019-11-28 NOTE — Progress Notes (Signed)
Modified Barium Swallow Progress Note  Patient Details  Name: Susan Banks MRN: [id number] Date of Birth: November 07, 1925  Today's Date: 11/28/2019  Modified Barium Swallow completed.  Full report located under Chart Review in the Imaging Section.  Brief recommendations include the following:  Clinical Impression   Patient presents with functional oropharyngeal swallow and known cervical esophageal dysphagia- Zenker's diverticulum. Decreased UES relaxation and Zenker's results in backflow of barium into pharynx - with inconsistent pt awareness.  Liquid swallows following solids does not improve clearance and increases backflow.  Dry swallows (pt states she needs to conduct) are helpful to decrease residuals.    No aspiration/penetration of any consistency tested noted but she is at risk with her Zenker's.  At end of testing, pt denies sensation of residuals however minimal retention continued.  Advised she needs her pills to be crushed and to conduct dry swallows with each bolus.    Educated pt to her lack of sensation to minimal retention remained - and advised she consume small amounts t/o the day.  Informed pt and her daughter [name] to her increased asp pna risk if she does aspirate and advised that caution with po is indicated.  Concern for pt's adequacy of nutrition given her effort with po intake - thus advised maximize Ensure, liquid nutrition as much as able.  Suspect backflow of po is causing pt to become "choked".      Upon esophageal sweep, pt appeared clear- no radiologist present to confirm.    Swallow Evaluation Recommendations       SLP Diet Recommendations: Dysphagia 3 (Mech soft) solids;Thin liquid(maximize liquid nutrition)   Liquid Administration via: Cup;Straw   Medication Administration: Other (Comment)(crushed with liquid)       Compensations: Slow rate;Small sips/bites, multiple swallows per bite/sip, Small frequent meals    Postural Changes: Remain semi-upright  after after feeds/meals (Comment);Seated upright at 90 degrees   Oral Care Recommendations: Oral care BID       Kathleen Lime, MS [facility] SLP Acute Rehab Services Office [phone]  Macario Golds 11/28/2019,9:37 AM

## 2019-11-28 NOTE — Progress Notes (Signed)
Inpatient Rehabilitation-Admissions Coordinator   Inpatient Rehab Admissions:  Inpatient Rehab Consult received.  I met with pt at the bedside for rehabilitation assessment and to discuss goals and expectations of an inpatient rehab admission.  Feel pt is a good candidate for CIR given her prior level of function, her ability to follow commands and instructions, and her great family support. Confirmed DC support from her daughter. Pt and her daughter would like to pursue this program if insurance approves. AC will begin insurance authorization process for possible admit. Will update once there has been a determination.   Raechel Ache, OTR/L  Rehab Admissions Coordinator  [phone] 11/28/2019 3:31 PM

## 2019-11-28 NOTE — Progress Notes (Signed)
Physical Therapy Treatment Patient Details Name: Susan Banks MRN: [id number] DOB: 1925-11-30 Today's Date: 11/28/2019    History of Present Illness Susan Banks is a 86 y.o. female with medical history significant of essential hypertension, hyperlipidemia, pulmonary embolus in 2018 on Eliquis, chronic kidney disease stage III, history of C. difficile, mild dementia, and GERD presents after having fall at home.  PTA pt reports completing most ADLs without assistance and ambulated without AD. Imaging demonstrated R hip fracture. Pt has lots 7 pounds since December. Pt underwent ORIF R hip 11/26/19.     PT Comments    Pt demonstrated much improved activity tolerance and progression today. Pt required much reduced assitance during bed mobility progressing from total Ax2 levels to modA-minA for hip/LE management, with good carry over with education for sequencing and body mechanics. Pt was able to stand with heavy verbal cues for sequencing and minA for assistance. Pt demonstrates difficulty with postural control in sitting and standing today requiring multiple cues for upright posture. Pt successfully stand/pivot to chair with modA for RW management, no near LOB demonstrated. Plan to assist patient back to bed to assess further mobility progression. Pt remains appropriate for CIR due to current ability for progression and high family support. PT will continue following acutely to address deficits listed below until transition to next level of care.   Follow Up Recommendations  CIR     Equipment Recommendations  Other (comment)(TBD at next venue of care)    Recommendations for Other Services Rehab consult     Precautions / Restrictions Precautions Precautions: Anterior Hip Precaution Booklet Issued: No Restrictions Weight Bearing Restrictions: Yes RLE Weight Bearing: Weight bearing as tolerated    Mobility  Bed Mobility Overal bed mobility: Needs Assistance Bed Mobility: Supine to  Sit     Supine to sit: Mod assist;Min assist;HOB elevated     General bed mobility comments: Pt required mod-minA for LE managment of R LE with improved self management with VCs for increased pelvic stability prior to mobility  Transfers Overall transfer level: Needs assistance Equipment used: Rolling walker (2 wheeled) Transfers: Sit to/from [facility] Sit to Stand: Min assist;From elevated surface Stand pivot transfers: Mod assist;From elevated surface       General transfer comment: Pt demonstrated much improved sit>stand transfer today, but reported nausea with 1 episode of dry heaving following transfer.       Balance Overall balance assessment: Needs assistance Sitting-balance support: Single extremity supported;Feet supported Sitting balance-Leahy Scale: Fair Sitting balance - Comments: Required heavy LUE for balance initially, but able to progress to minimal UE usage. Limited primairily due to fatigue today. Postural control: Left lateral lean Standing balance support: Bilateral upper extremity supported Standing balance-Leahy Scale: Poor          Cognition Arousal/Alertness: Awake/alert Behavior During Therapy: WFL for tasks assessed/performed Overall Cognitive Status: History of cognitive impairments - at baseline           General Comments: Pt was more agreeable to stand/pivot to chair today, worked 30 min after premedication today.      Exercises      General Comments General comments (skin integrity, edema, etc.): VSS throughout tx with >99% SPO2 on RA      Pertinent Vitals/Pain Pain Assessment: Faces Faces Pain Scale: Hurts even more Pain Location: right hip Pain Descriptors / Indicators: Grimacing;Guarding Pain Intervention(s): Limited activity within patient's tolerance;Repositioned;Monitored during session;Premedicated before session           PT  Goals (current goals can now be found in the care plan section) Acute Rehab  PT Goals Patient Stated Goal: Get out of bed PT Goal Formulation: With patient/family Time For Goal Achievement: 12/11/19 Potential to Achieve Goals: Good Progress towards PT goals: Progressing toward goals    Frequency    Min 5X/week      PT Plan Current plan remains appropriate       AM-PAC PT "6 Clicks" Mobility   Outcome Measure  Help needed turning from your back to your side while in a flat bed without using bedrails?: A Lot Help needed moving from lying on your back to sitting on the side of a flat bed without using bedrails?: A Lot Help needed moving to and from a bed to a chair (including a wheelchair)?: A Lot Help needed standing up from a chair using your arms (e.g., wheelchair or bedside chair)?: A Lot Help needed to walk in hospital room?: Total Help needed climbing 3-5 steps with a railing? : Total 6 Click Score: 10    End of Session Equipment Utilized During Treatment: Gait belt Activity Tolerance: Patient limited by fatigue;Treatment limited secondary to medical complications (Comment) Patient left: with SCD's reapplied;in chair;with call bell/phone within reach;with chair alarm set Nurse Communication: Mobility status;Need for lift equipment;Other (comment)(RN notified PT to visit patient after lunch) PT Visit Diagnosis: Unsteadiness on feet (R26.81);Muscle weakness (generalized) (M62.81);History of falling (Z91.81);Difficulty in walking, not elsewhere classified (R26.2)     Time: [id number] PT Time Calculation (min) (ACUTE ONLY): 35 min  Charges:  $Therapeutic Activity: 23-37 mins                     Ann Held PT, DPT Acute Rehab [facility] Rehabilitation P: [phone]    Evelina Lore A Damoney Julia 11/28/2019, 12:44 PM

## 2019-11-28 NOTE — Plan of Care (Signed)

## 2019-11-29 LAB — CBC
HCT: 30.6 % — ABNORMAL LOW (ref 36.0–46.0)
Hemoglobin: 10 g/dL — ABNORMAL LOW (ref 12.0–15.0)
MCH: 29.9 pg (ref 26.0–34.0)
MCHC: 32.7 g/dL (ref 30.0–36.0)
MCV: 91.3 fL (ref 80.0–100.0)
Platelets: 139 10*3/uL — ABNORMAL LOW (ref 150–400)
RBC: 3.35 MIL/uL — ABNORMAL LOW (ref 3.87–5.11)
RDW: 13.8 % (ref 11.5–15.5)
WBC: 12.6 10*3/uL — ABNORMAL HIGH (ref 4.0–10.5)
nRBC: 0 % (ref 0.0–0.2)

## 2019-11-29 LAB — SARS CORONAVIRUS 2 (TAT 6-24 HRS): SARS Coronavirus 2: NEGATIVE

## 2019-11-29 NOTE — Progress Notes (Signed)
Inpatient Rehabilitation-Admissions Coordinator   [employer] has reviewed her case and issued a denial. They did offer a peer to peer, which Dr. Earnest Conroy agreed to complete. After peer to peer, pt was still denied due to lack of medical necessity.   AC has notified pt and her daughter who are open to SNF placement at this time.   AC has notified TOC team. MD aware.   AC will sign off.   Please call if questions.   Raechel Ache, OTR/L  Rehab Admissions Coordinator  [phone] 11/29/2019 4:06 PM

## 2019-11-29 NOTE — TOC Initial Note (Addendum)
Transition of Care [facility]) - Initial/Assessment Note    Patient Details  Name: Susan Banks MRN: [id number] Date of Birth: Jun 27, 1926  Transition of Care [facility]) CM/SW Contact:    Sharin Mons, RN Phone Number: [phone] 11/29/2019, 3:58 PM  Clinical Narrative:      Admitted s/p fall, suffered R hip, s/p ORIF R hip 11/26/2019, hx of  essential hypertension, hyperlipidemia, pulmonary embolus / Eliquis, chronic kidney disease stage III, history of C. difficile, mild dementia, and GERD. From home with husband. States PTA independent with ADL's, no DME usage.  RNCM received consult for possible SNF placement at time of discharge. Pt denied inpatient rehab.services by insurance. Patient now agreeable to SNF placement at time of discharge. Patient reported that patient's spouse is currently unable to care for patient at their home given patient's current physical needs and fall risk.  Patient reports preference for [facility]. NCM discussed insurance authorization process and provided Medicare SNF ratings list. Patient expressed being hopeful for rehab and to feel better soon. No further questions reported at this time. NCM to continue to follow and assist with discharge planning needs.  Expected Discharge Plan: Skilled Nursing Facility(Insurance denied pt for CIR)     Patient Goals and CMS Choice        Expected Discharge Plan and Services Expected Discharge Plan: Skilled Nursing Facility(Insurance denied pt for CIR)                                              Prior Living Arrangements/Services                       Activities of Daily Living Home Assistive Devices/Equipment: None ADL Screening (condition at time of admission) Patient's cognitive ability adequate to safely complete daily activities?: Yes Is the patient deaf or have difficulty hearing?: No Does the patient have difficulty seeing, even when wearing glasses/contacts?: No Does the  patient have difficulty concentrating, remembering, or making decisions?: No Patient able to express need for assistance with ADLs?: Yes Does the patient have difficulty dressing or bathing?: Yes Independently performs ADLs?: Yes (appropriate for developmental age) Does the patient have difficulty walking or climbing stairs?: Yes Weakness of Legs: Both Weakness of Arms/Hands: None  Permission Sought/Granted                  Emotional Assessment              Admission diagnosis:  Hip fracture ([place]) [S72.009A] Pain [R52] Closed fracture of right hip, initial encounter ([place]) [S72.001A] Patient Active Problem List   Diagnosis Date Noted  . Fracture, intertrochanteric, right femur ([place]) 11/26/2019  . History of pulmonary embolus (PE) 11/26/2019  . History of DVT (deep vein thrombosis) 11/26/2019  . Fall at home, initial encounter 11/26/2019  . Chronic anticoagulation   . Chronic kidney disease   . Pulmonary embolus ([place]) 08/15/2017  . Hyperlipidemia 06/14/2015  . Fever 06/14/2015  . Altered mental status 06/13/2015  . Hyponatremia 05/22/2015  . Normocytic anemia 05/22/2015  . GERD (gastroesophageal reflux disease) 05/22/2015  . Diarrhea 05/20/2015  . HTN (hypertension) 05/20/2015  . Chronic kidney disease, stage 3 05/20/2015  . C. difficile colitis 05/20/2015  . Proctocolitis    PCP:  Kelton Pillar, MD Pharmacy:   [id number] - [place], [address] [place] [address] Phone: [phone] Fax: [phone]  [place], [place] [address] [facility] [address] [place] [address] Phone: [phone] Fax: [phone]  [facility]) - [place], [place] [address] Phone: [phone] Fax: [phone]     Social Determinants of Health (SDOH) Interventions     Readmission Risk Interventions No flowsheet data found.

## 2019-11-29 NOTE — Progress Notes (Signed)
Orthopaedic Trauma Service Progress Note  Patient ID: Susan Banks MRN: [id number] DOB/AGE: December 22, 1925 86 y.o.  Subjective:  Doing much better Pain improved  Denied for CIR  Will need snf, hopeful for [facility]    ROS As above  Objective:   VITALS:   Vitals:   11/29/19 0358 11/29/19 0723 11/29/19 1515 11/29/19 1943  BP: (!) 136/56 136/63 115/64 132/70  Pulse: 80 76 76 97  Resp: 16 14 14 14   Temp: 98.1 F (36.7 C) 98.3 F (36.8 C) 98.4 F (36.9 C) 98.5 F (36.9 C)  TempSrc: Oral Oral Oral Oral  SpO2: 95% 98% 98% 97%  Weight:      Height:        Estimated body mass index is 17.31 kg/m as calculated from the following:   Height as of this encounter: 5\' 5"  (1.651 m).   Weight as of this encounter: 47.2 kg.   Intake/Output      03/23 0701 - 03/24 0700   P.O. 360   I.V. (mL/kg)    Total Intake(mL/kg) 360 (7.6)   Urine (mL/kg/hr)    Total Output    Net +360       Urine Occurrence 1 x     LABS  Results for orders placed or performed during the hospital encounter of 11/26/19 (from the past 24 hour(s))  CBC     Status: Abnormal   Collection Time: 11/29/19  4:03 AM  Result Value Ref Range   WBC 12.6 (H) 4.0 - 10.5 K/uL   RBC 3.35 (L) 3.87 - 5.11 MIL/uL   Hemoglobin 10.0 (L) 12.0 - 15.0 g/dL   HCT 30.6 (L) 36.0 - 46.0 %   MCV 91.3 80.0 - 100.0 fL   MCH 29.9 26.0 - 34.0 pg   MCHC 32.7 30.0 - 36.0 g/dL   RDW 13.8 11.5 - 15.5 %   Platelets 139 (L) 150 - 400 K/uL   nRBC 0.0 0.0 - 0.2 %     PHYSICAL EXAM:   Gen: NAD, sitting up in bed, appears well Lungs: unlabored Cardiac: reg Ext:       Right Lower Extremity              Dressing c/d/i             Ext warm              + DP pulse             Minimal swelling              No DCT             Compartments are soft              DPN, SPN, TN sensation intact             EHL, FHL, Lesser toe motor intact             Ankle  flexion, extension, inversion and eversion intact   Assessment/Plan: 3 Days Post-Op   Active Problems:   HTN (hypertension)   Chronic kidney disease, stage 3   GERD (gastroesophageal reflux disease)   Fracture, intertrochanteric, right femur (HCC)   Chronic anticoagulation   History of pulmonary embolus (PE)   History of DVT (deep vein thrombosis)   Fall  at home, initial encounter   Anti-infectives (From admission, onward)   Start     Dose/Rate Route Frequency Ordered Stop   11/27/19 0400  ceFAZolin (ANCEF) IVPB 2g/100 mL premix     2 g 200 mL/hr over 30 Minutes Intravenous Every 12 hours 11/26/19 1811 11/27/19 0447   11/26/19 1417  ceFAZolin (ANCEF) 2-4 GM/100ML-% IVPB    Note to Pharmacy: Henrine Screws   : cabinet override      11/26/19 1417 11/27/19 0229   11/26/19 1000  cephALEXin (KEFLEX) capsule 250 mg     250 mg Oral 2 times daily 11/26/19 0724 11/26/19 2227    .  POD/HD#: 3   89 s/p fall with right hip fracture   - fall   -Right intertrochanteric hip fracture s/p IMN             Weight-bear as tolerated right leg             Range of motion as tolerated right hip and knee             PT and OT             Dressing change as needed              Ice as needed             TED hose    - Pain management:             Scheduled Tylenol             Norco as needed for breakthrough pain             Minimize IV narcotics   - ABL anemia/Hemodynamics             stable  Monitor    - Medical issues              Per primary team              - DVT/PE prophylaxis:             On Eliquis chronically dose decreased to 2.5 mg daily                         PE/DVT was in 2018 and dc summary recommended 6 months of treatment                                      Was PE considered unprovoked?                                     Pt is a fall risk with her advancing age                                      ? Discuss with prescribing provider about continuation               TED hose              SCD L leg    - ID:              periop abx completed    - Metabolic Bone Disease:  Vitamin D levels look good             Fracture still suggestive of metabolic bone disease: Osteoporosis             Recommend DEXA scan in outpatient setting in 6-8 weeks              Continue with home vitamin D dosing   - Activity:             Up with therapies             Weight-bear as tolerated right leg   - FEN/GI prophylaxis/Foley/Lines:             Regular diet with thin liquids pending MBS   - Impediments to fracture healing:             Low-energy fracture suspicious for osteoporosis   - Dispo:             Continue with therapies             will need snf  Ortho issues stable      Jari Pigg, PA-C [phone] (C) 11/29/2019, 8:47 PM  [facility] [place] [address] [phone] Domingo Sep (F)

## 2019-11-29 NOTE — NC FL2 (Signed)
[place] LEVEL OF CARE SCREENING TOOL     IDENTIFICATION  Patient Name: Susan Banks Birthdate: 10/27/1925 Sex: female Admission Date (Current Location): 11/26/2019  [facility] and [place] Number:  Herbalist and Address:  [facility], [place] [address] [place], [place] [address]      Provider Number: [id number]  Attending Physician Name and Address:  Guilford Shi, MD  Relative Name and Phone Number:  Suszanne Conners (Daughter) [phone]    Current Level of Care: Hospital Recommended Level of Care: Section Prior Approval Number:    Date Approved/Denied:   PASRR Number: [id number]  Discharge Plan: SNF    Current Diagnoses: Patient Active Problem List   Diagnosis Date Noted  . Fracture, intertrochanteric, right femur ([address]) 11/26/2019  . History of pulmonary embolus (PE) 11/26/2019  . History of DVT (deep vein thrombosis) 11/26/2019  . Fall at home, initial encounter 11/26/2019  . Chronic anticoagulation   . Chronic kidney disease   . Pulmonary embolus ([place]) 08/15/2017  . Hyperlipidemia 06/14/2015  . Fever 06/14/2015  . Altered mental status 06/13/2015  . Hyponatremia 05/22/2015  . Normocytic anemia 05/22/2015  . GERD (gastroesophageal reflux disease) 05/22/2015  . Diarrhea 05/20/2015  . HTN (hypertension) 05/20/2015  . Chronic kidney disease, stage 3 05/20/2015  . C. difficile colitis 05/20/2015  . Proctocolitis     Orientation RESPIRATION BLADDER Height & Weight     Self, Time, Situation, Place  Normal External catheter Weight: 47.2 kg Height:  5\' 5"  (165.1 cm)  BEHAVIORAL SYMPTOMS/MOOD NEUROLOGICAL BOWEL NUTRITION STATUS      Continent Diet(Refer to d/c summary)  AMBULATORY STATUS COMMUNICATION OF NEEDS Skin   Extensive Assist Verbally Normal                       Personal Care Assistance Level of Assistance  Bathing, Feeding, Dressing Bathing Assistance: Limited  assistance Feeding assistance: Independent Dressing Assistance: Limited assistance     Functional Limitations Info  Sight, Speech, Hearing Sight Info: Adequate Hearing Info: Adequate Speech Info: Adequate    SPECIAL CARE FACTORS FREQUENCY  PT (By licensed PT), OT (By licensed OT)     PT Frequency: 5x/week evaluate and treat OT Frequency: 5x/week evaluate and treat            Contractures Contractures Info: Not present    Additional Factors Info  Code Status, Allergies Code Status Info: full code Allergies Info: Codeine, Sulfa Antibiotics, Tramadol Hcl, Tramadol Hcl           Current Medications (11/29/2019):  This is the current hospital active medication list Current Facility-Administered Medications  Medication Dose Route Frequency Provider Last Rate Last Admin  . acetaminophen (TYLENOL) tablet 500 mg  500 mg Oral Q8H Ainsley Spinner, PA-C   500 mg at 11/29/19 1346  . acidophilus (RISAQUAD) capsule 1 capsule  1 capsule Oral Daily Ainsley Spinner, PA-C   1 capsule at 11/29/19 0947  . amLODipine (NORVASC) tablet 5 mg  5 mg Oral Daily Ainsley Spinner, PA-C   5 mg at 11/29/19 [id number]  . apixaban (ELIQUIS) tablet 2.5 mg  2.5 mg Oral BID Ainsley Spinner, PA-C   2.5 mg at 11/29/19 0947  . ascorbic acid (VITAMIN C) tablet 500 mg  500 mg Oral Daily Ainsley Spinner, PA-C   500 mg at 11/29/19 [id number]  . cholecalciferol (VITAMIN D3) tablet 2,000 Units  2,000 Units Oral BID Ainsley Spinner, PA-C   2,000 Units  at 11/29/19 0948  . diphenhydrAMINE (BENADRYL) injection 12.5 mg  12.5 mg Intravenous Q6H PRN Ainsley Spinner, PA-C      . docusate sodium (COLACE) capsule 100 mg  100 mg Oral BID Ainsley Spinner, PA-C   100 mg at 11/29/19 0948  . donepezil (ARICEPT) tablet 10 mg  10 mg Oral Daily Ainsley Spinner, PA-C   10 mg at 11/29/19 0948  . HYDROcodone-acetaminophen (NORCO/VICODIN) 5-325 MG per tablet 1-2 tablet  1-2 tablet Oral Q4H PRN Ainsley Spinner, PA-C   2 tablet at 11/29/19 1130  . lidocaine (LIDODERM) 5 % 1 patch  1 patch  Transdermal Q24H Florencia Reasons, MD   1 patch at 11/29/19 1346  . menthol-cetylpyridinium (CEPACOL) lozenge 3 mg  1 lozenge Oral PRN Ainsley Spinner, PA-C   3 mg at 11/27/19 1459   Or  . phenol (CHLORASEPTIC) mouth spray 1 spray  1 spray Mouth/Throat PRN Ainsley Spinner, PA-C      . metoCLOPramide (REGLAN) tablet 5-10 mg  5-10 mg Oral Q8H PRN Ainsley Spinner, PA-C       Or  . metoCLOPramide (REGLAN) injection 5-10 mg  5-10 mg Intravenous Q8H PRN Ainsley Spinner, PA-C      . morphine 2 MG/ML injection 0.5 mg  0.5 mg Intravenous Q2H PRN Ainsley Spinner, PA-C   0.5 mg at 11/27/19 2102  . ondansetron (ZOFRAN) tablet 4 mg  4 mg Oral Q6H PRN Ainsley Spinner, PA-C       Or  . ondansetron [facility]) injection 4 mg  4 mg Intravenous Q6H PRN Ainsley Spinner, PA-C   4 mg at 11/26/19 [id number]  . pantoprazole (PROTONIX) EC tablet 40 mg  40 mg Oral Daily Ainsley Spinner, PA-C   40 mg at 11/29/19 0947  . senna (SENOKOT) tablet 8.6 mg  1 tablet Oral BID Ainsley Spinner, PA-C   8.6 mg at 11/29/19 [id number]     Discharge Medications: Please see discharge summary for a list of discharge medications.  Relevant Imaging Results:  Relevant Lab Results:   Additional Information SS# [id number]  Sharin Mons, RN

## 2019-11-29 NOTE — Progress Notes (Signed)
Occupational Therapy Treatment Patient Details Name: Susan Banks MRN: [id number] DOB: 06-03-1926 Today's Date: 11/29/2019    History of present illness Susan Banks is a 86 y.o. female with medical history significant of essential hypertension, hyperlipidemia, pulmonary embolus in 2018 on Eliquis, chronic kidney disease stage III, history of C. difficile, mild dementia, and GERD presents after having fall at home.  PTA pt reports completing most ADLs without assistance and ambulated without AD. Imaging demonstrated R hip fracture. Pt has lots 7 pounds since December. Pt underwent ORIF R hip 11/26/19.    OT comments  Pt sitting in recliner upon arrival, pt drinking ensure drink in slightly reclined position to promote safe posture for swallowing due to kyphosis. Pt required modA+2 for sit<>stand and stand-pivot from recliner to EOB. She required maxA to don briefs. Maintained SpO2 >90% RA throughout session. Pt will continue to benefit from skilled OT services to maximize safety and independence with ADL/IADL and functional mobility. Will continue to follow acutely and progress as tolerated.    Follow Up Recommendations  CIR;Supervision/Assistance - 24 hour    Equipment Recommendations  Other (comment)(TBD)    Recommendations for Other Services      Precautions / Restrictions Precautions Precautions: Anterior Hip Precaution Booklet Issued: No Restrictions Weight Bearing Restrictions: Yes RLE Weight Bearing: Weight bearing as tolerated Other Position/Activity Restrictions: no range of motion precautions       Mobility Bed Mobility Overal bed mobility: Needs Assistance Bed Mobility: Sit to Supine     Supine to sit: Mod assist Sit to supine: Mod assist   General bed mobility comments: modA for LE management and guidance into bed.  Transfers Overall transfer level: Needs assistance Equipment used: Rolling walker (2 wheeled) Transfers: Sit to/from [employer] Sit to Stand: Mod assist Stand pivot transfers: Mod assist       General transfer comment: Pt continues to require modA to L hip to assist with improved standing posture. Able to come to stand with elevated surface but requires cueing for improved postural control.    Balance Overall balance assessment: Needs assistance Sitting-balance support: Single extremity supported;Feet supported Sitting balance-Leahy Scale: Fair Sitting balance - Comments: Required heavy LUE for balance initially, but able to progress to minimal UE usage. Limited primairily due to fatigue today. Postural control: Left lateral lean Standing balance support: Bilateral upper extremity supported Standing balance-Leahy Scale: Poor Standing balance comment: Required assistance to maintain standing balance                           ADL either performed or assessed with clinical judgement   ADL Overall ADL's : Needs assistance/impaired Eating/Feeding: Set up;Sitting Eating/Feeding Details (indicate cue type and reason): in slightly reclined position due to kyphotic posture                 Lower Body Dressing: Maximal assistance;Sit to/from stand Lower Body Dressing Details (indicate cue type and reason): maxA to don briefs over feet, pt assisting with pickup up feet/legs to don briefs Toilet Transfer: Moderate assistance;Stand-pivot;RW Toilet Transfer Details (indicate cue type and reason): modA to pivot from recliner to [place] and Hygiene: Maximal assistance       Functional mobility during ADLs: Moderate assistance;Rolling walker General ADL Comments: modA to powerup into standing from recliner;pt with decreased activity tolerance      Vision       Perception     Praxis  Cognition Arousal/Alertness: Awake/alert Behavior During Therapy: WFL for tasks assessed/performed Overall Cognitive Status: History of cognitive impairments - at  baseline                                 General Comments: notable memory issues;required cues for sequencing        Exercises     Shoulder Instructions       General Comments vss    Pertinent Vitals/ Pain       Pain Assessment: Faces Faces Pain Scale: Hurts even more Pain Location: right hip Pain Descriptors / Indicators: Grimacing;Guarding Pain Intervention(s): Limited activity within patient's tolerance;Monitored during session  Home Living                                          Prior Functioning/Environment              Frequency  Min 3X/week        Progress Toward Goals  OT Goals(current goals can now be found in the care plan section)  Progress towards OT goals: Progressing toward goals  Acute Rehab OT Goals Patient Stated Goal: to continue to get stronger Time For Goal Achievement: 12/11/19 Potential to Achieve Goals: Good ADL Goals Pt Will Perform Grooming: with set-up;sitting Pt Will Perform Lower Body Dressing: with mod assist;sit to/from stand Pt Will Transfer to Toilet: with mod assist;stand pivot transfer;bedside commode Pt Will Perform Toileting - Clothing Manipulation and hygiene: with mod assist;sit to/from stand Additional ADL Goal #1: Pt to tolerate standing up to 3 min with min assist x 1 in preparation for ADLs.  Plan Discharge plan remains appropriate    Co-evaluation                 AM-PAC OT "6 Clicks" Daily Activity     Outcome Measure   Help from another person eating meals?: A Little Help from another person taking care of personal grooming?: A Little Help from another person toileting, which includes using toliet, bedpan, or urinal?: A Lot Help from another person bathing (including washing, rinsing, drying)?: A Lot Help from another person to put on and taking off regular upper body clothing?: A Lot Help from another person to put on and taking off regular lower body clothing?: A  Lot 6 Click Score: 14    End of Session Equipment Utilized During Treatment: Gait belt;Rolling walker  OT Visit Diagnosis: Unsteadiness on feet (R26.81);Muscle weakness (generalized) (M62.81);Pain Pain - Right/Left: Right Pain - part of body: Hip   Activity Tolerance Patient limited by fatigue;Patient limited by pain(Limited by nausea)   Patient Left in bed;with call bell/phone within reach;with bed alarm set;with family/visitor present   Nurse Communication Mobility status        Time: [id number] OT Time Calculation (min): 23 min  Charges: OT General Charges $OT Visit: 1 Visit OT Treatments $Self Care/Home Management : 23-37 mins  Helene Kelp OTR/L Acute Rehabilitation Services Office: [phone]    Wyn Forster 11/29/2019, 3:50 PM

## 2019-11-29 NOTE — Progress Notes (Signed)
PROGRESS NOTE    Susan Banks  [id number]  DOB: 05-18-1926  PCP: Kelton Pillar, MD Admit date:11/26/2019  86 y.o.femalewith medical history significant ofessential hypertension, hyperlipidemia, pulmonary embolus in 2018 on Eliquis, chronic kidney disease stage III,history of C. difficile, mild dementia, and GERD presented to ED from home after a fall.  Found to have right anterior trochanteric hip fracture, status post IM and on March 20, recieved blood transfusion 3/21 for postop anemia, family prefers CIR placement as daughter has to work, unable to provide 24 hr supervision.   Subjective:  Patient sitting in bedside chair in no acute distress.  Daughter Olam Idler at bedside.She apparently had hyponatremia with diuretic/antihypertensives in the past, hence changed to amlodipine over the last year.She currently has support stockings in place, and orthostatic check negative by PT yesterday.Has memory issues but currently oriented x3 and quite communicative.  Objective: Vitals:   11/28/19 2300 11/29/19 0358 11/29/19 0723 11/29/19 1515  BP:  (!) 136/56 136/63 115/64  Pulse:  80 76 76  Resp:  16 14 14   Temp:  98.1 F (36.7 C) 98.3 F (36.8 C) 98.4 F (36.9 C)  TempSrc:  Oral Oral Oral  SpO2: 96% 95% 98% 98%  Weight:      Height:        Intake/Output Summary (Last 24 hours) at 11/29/2019 1804 Last data filed at 11/29/2019 1500 Gross per 24 hour  Intake 240 ml  Output --  Net 240 ml   Filed Weights   11/26/19 0359  Weight: 47.2 kg    Physical Examination:  General exam: Appears calm and comfortable  Respiratory system: Clear to auscultation. Respiratory effort normal. Cardiovascular system: S1 & S2 heard, RRR. No JVD, murmurs, rubs, gallops or clicks. No pedal edema. Gastrointestinal system: Abdomen is nondistended, soft and nontender. Normal bowel sounds heard. Central nervous system: Alert and oriented. No new focal neurological deficits. Extremities: No  contractures, edema or joint deformities.  Skin: No rashes, lesions or ulcers Psychiatry: Judgement and insight appear normal. Mood & affect appropriate.   Data Reviewed: I have personally reviewed following labs and imaging studies  CBC: Recent Labs  Lab 11/26/19 0403 11/26/19 0403 11/26/19 0441 11/26/19 2131 11/27/19 0410 11/28/19 0215 11/29/19 0403  WBC 7.9  --   --   --  12.5* 15.1* 12.6*  NEUTROABS 5.4  --   --   --   --   --   --   HGB 12.0   < > 11.9* 8.6* 7.6* 10.5* 10.0*  HCT 36.9   < > 35.0* 26.4* 23.2* 31.4* 30.6*  MCV 92.0  --   --   --  92.8 90.0 91.3  PLT 220  --   --   --  153 121* 139*   < > = values in this interval not displayed.   Basic Metabolic Panel: Recent Labs  Lab 11/26/19 0441 11/27/19 0410 11/28/19 0215  NA 139 138 140  K 3.8 5.1 4.2  CL 105 105 107  CO2  --  23 24  GLUCOSE 114* 174* 109*  BUN 26* 21 21  CREATININE 1.00 1.11* 1.07*  CALCIUM  --  9.7 9.2   GFR: Estimated Creatinine Clearance: 25 mL/min (A) (by C-G formula based on SCr of 1.07 mg/dL (H)). Liver Function Tests: No results for input(s): AST, ALT, ALKPHOS, BILITOT, PROT, ALBUMIN in the last 168 hours. No results for input(s): LIPASE, AMYLASE in the last 168 hours. No results for input(s): AMMONIA in the last 168  hours. Coagulation Profile: No results for input(s): INR, PROTIME in the last 168 hours. Cardiac Enzymes: No results for input(s): CKTOTAL, CKMB, CKMBINDEX, TROPONINI in the last 168 hours. BNP (last 3 results) No results for input(s): PROBNP in the last 8760 hours. HbA1C: No results for input(s): HGBA1C in the last 72 hours. CBG: No results for input(s): GLUCAP in the last 168 hours. Lipid Profile: No results for input(s): CHOL, HDL, LDLCALC, TRIG, CHOLHDL, LDLDIRECT in the last 72 hours. Thyroid Function Tests: No results for input(s): TSH, T4TOTAL, FREET4, T3FREE, THYROIDAB in the last 72 hours. Anemia Panel: No results for input(s): VITAMINB12, FOLATE,  FERRITIN, TIBC, IRON, RETICCTPCT in the last 72 hours. Sepsis Labs: No results for input(s): PROCALCITON, LATICACIDVEN in the last 168 hours.  Recent Results (from the past 240 hour(s))  Respiratory Panel by RT PCR (Flu A&B, Covid) - Nasopharyngeal Swab     Status: None   Collection Time: 11/26/19  4:13 AM   Specimen: Nasopharyngeal Swab  Result Value Ref Range Status   SARS Coronavirus 2 by RT PCR NEGATIVE NEGATIVE Final    Comment: (NOTE) SARS-CoV-2 target nucleic acids are NOT DETECTED. The SARS-CoV-2 RNA is generally detectable in upper respiratoy specimens during the acute phase of infection. The lowest concentration of SARS-CoV-2 viral copies this assay can detect is 131 copies/mL. A negative result does not preclude SARS-Cov-2 infection and should not be used as the sole basis for treatment or other patient management decisions. A negative result may occur with  improper specimen collection/handling, submission of specimen other than nasopharyngeal swab, presence of viral mutation(s) within the areas targeted by this assay, and inadequate number of viral copies (<131 copies/mL). A negative result must be combined with clinical observations, patient history, and epidemiological information. The expected result is Negative. Fact Sheet for Patients:  PinkCheek.be Fact Sheet for Healthcare Providers:  GravelBags.it This test is not yet ap proved or cleared by the [place] FDA and  has been authorized for detection and/or diagnosis of SARS-CoV-2 by FDA under an Emergency Use Authorization (EUA). This EUA will remain  in effect (meaning this test can be used) for the duration of the COVID-19 declaration under Section 564(b)(1) of the Act, 21 U.S.C. section 360bbb-3(b)(1), unless the authorization is terminated or revoked sooner.    Influenza A by PCR NEGATIVE NEGATIVE Final   Influenza B by PCR NEGATIVE NEGATIVE Final     Comment: (NOTE) The Xpert Xpress SARS-CoV-2/FLU/RSV assay is intended as an aid in  the diagnosis of influenza from Nasopharyngeal swab specimens and  should not be used as a sole basis for treatment. Nasal washings and  aspirates are unacceptable for Xpert Xpress SARS-CoV-2/FLU/RSV  testing. Fact Sheet for Patients: PinkCheek.be Fact Sheet for Healthcare Providers: GravelBags.it This test is not yet approved or cleared by the [place] FDA and  has been authorized for detection and/or diagnosis of SARS-CoV-2 by  FDA under an Emergency Use Authorization (EUA). This EUA will remain  in effect (meaning this test can be used) for the duration of the  Covid-19 declaration under Section 564(b)(1) of the Act, 21  U.S.C. section 360bbb-3(b)(1), unless the authorization is  terminated or revoked. Performed at [facility], [place] [address], [place], [place] [address]   Surgical pcr screen     Status: None   Collection Time: 11/26/19  8:58 AM   Specimen: Nasal Mucosa; Nasal Swab  Result Value Ref Range Status   MRSA, PCR NEGATIVE NEGATIVE Final   Staphylococcus  aureus NEGATIVE NEGATIVE Final    Comment: (NOTE) The Xpert SA Assay (FDA approved for NASAL specimens in patients 18 years of age and older), is one component of a comprehensive surveillance program. It is not intended to diagnose infection nor to guide or monitor treatment. Performed at [facility], [place] [address] [place], [place] [address]       Radiology Studies: DG Swallowing Func-Speech Pathology  Result Date: 11/28/2019 Objective Swallowing Evaluation: Type of Study: MBS-Modified Barium Swallow Study  Patient Details Name: Susan Banks MRN: [id number] Date of Birth: February 23, 1926 Today's Date: 11/28/2019 Time: SLP Start Time (ACUTE ONLY): [id number] -SLP Stop Time (ACUTE ONLY): 0850 SLP Time Calculation (min) (ACUTE ONLY): 15 min Past Medical History: Past  Medical History: Diagnosis Date . C. difficile diarrhea  . Chronic anticoagulation  . Chronic kidney disease  . Constipation  . GERD (gastroesophageal reflux disease)  . Hypercholesteremia  . Hypertension  . Left leg DVT ([place]) 08/2017 . Pulmonary embolism ([place]) 08/2017 . Shortness of breath  Past Surgical History: Past Surgical History: Procedure Laterality Date . ABDOMINAL SURGERY   . ESOPHAGOGASTRODUODENOSCOPY N/A 07/04/2014  Procedure: ESOPHAGOGASTRODUODENOSCOPY (EGD);  Surgeon: Laverda Page, MD;  Location: [facility];  Service: Cardiovascular;  Laterality: N/A; . TEE WITHOUT CARDIOVERSION N/A 06/13/2014  Procedure: TRANSESOPHAGEAL ECHOCARDIOGRAM (TEE);  Surgeon: Laverda Page, MD;  Location: [facility];  Service: Cardiovascular;  Laterality: N/A; HPI: LARRISSA REIFSCHNEIDER is a 86 y.o. female with medical history significant of essential hypertension, hyperlipidemia, pulmonary embolus in 2018 on Eliquis, chronic kidney disease stage III, history of C. difficile, mild dementia, and GERD presents after having fall at home.  History is obtained from the patient with assistance from her daughter present at bedside.  She lives at home with her husband and is able to complete most of her ADLs without assistance.  She does not use a walker or cane.  This morning around 2 AM she had gone to use the bathroom when she states that her legs gave out on her.  She did not hit her head or lose consciousness and reported having severe pain in the right hip area.  Patient was not able to ambulate.  She had been seen recently by her primary care provider 7 days ago and diagnosed with a UTI.  They started her on 7-day course of cephalexin, but was reported that cultures did not grow out much.  Patient has also not been eating or drinking much, and has had about a 7 pound weight loss since December.  Patient had also been question to possibly be having vertigo, but symptoms also were possibly related to dehydration.  Other  associated symptoms include nausea, some constipation, and insomnia.  ED Course: Upon admission into the emergency department patient was found to be mildly tachypneic with all other vital signs relatively within normal limits.  Labs were relatively unremarkable.  X-rays of the right hip revealed an acute right intertrochanteric femur fracture.  She was given fentanyl for pain.  Most recent chest xray was showing no active disease.  Subjective: pt upright in flouro chair Assessment / Plan / Recommendation CHL IP CLINICAL IMPRESSIONS 11/28/2019 Clinical Impression Patient presents with functional oropharyngeal swallow and known cervical esophageal dysphagia- Zenker's diverticulum. Decreased UES relaxation and Zenker's results in backflow of barium into pharynx - with inconsistent pt awareness.  Liquid swallows following solids does not improve clearance and increases backflow.  Dry swallows (pt states she needs to conduct) are helpful to decrease  residuals.  No aspiration/penetration of any consistency tested noted but she is at risk with her Zenker's.  At end of testing, pt denies sensation of residuals however minimal retention continued.  Advised she needs her pills to be crushed and to conduct dry swallows with each bolus.  Educated pt to her lack of sensation to minimal retention remained - and advised she consume small amounts t/o the day.  Informed pt and her daughter [name] to her increased asp pna risk if she does aspirate and advised that caution with po is indicated.  Concern for pt's adequacy of nutrition given her effort with po intake - thus advised maximize Ensure, liquid nutrition as much as able.  Suspect backflow of po is causing pt to become "choked".   Upon esophageal sweep, pt appeared clear- no radiologist present to confirm. SLP Visit Diagnosis Dysphagia, unspecified (R13.10) Attention and concentration deficit following -- Frontal lobe and executive function deficit following -- Impact on safety  and function Moderate aspiration risk   CHL IP TREATMENT RECOMMENDATION 11/28/2019 Treatment Recommendations No treatment recommended at this time   Prognosis 11/28/2019 Prognosis for Safe Diet Advancement Good Barriers to Reach Goals Time post onset Barriers/Prognosis Comment -- CHL IP DIET RECOMMENDATION 11/28/2019 SLP Diet Recommendations Dysphagia 3 (Mech soft) solids;Thin liquid Liquid Administration via Cup;Straw Medication Administration Other (Comment) Compensations Slow rate;Small sips/bites Postural Changes Remain semi-upright after after feeds/meals (Comment);Seated upright at 90 degrees   CHL IP OTHER RECOMMENDATIONS 11/28/2019 Recommended Consults -- Oral Care Recommendations Oral care BID Other Recommendations --   CHL IP FOLLOW UP RECOMMENDATIONS 11/28/2019 Follow up Recommendations None   No flowsheet data found.     CHL IP ORAL PHASE 11/28/2019 Oral Phase WFL Oral - Pudding Teaspoon -- Oral - Pudding Cup -- Oral - Honey Teaspoon -- Oral - Honey Cup -- Oral - Nectar Teaspoon -- Oral - Nectar Cup -- Oral - Nectar Straw WFL Oral - Thin Teaspoon -- Oral - Thin Cup WFL Oral - Thin Straw WFL Oral - Puree WFL Oral - Mech Soft WFL Oral - Regular -- Oral - Multi-Consistency -- Oral - Pill -- Oral Phase - Comment --  CHL IP PHARYNGEAL PHASE 11/28/2019 Pharyngeal Phase -- Pharyngeal- Pudding Teaspoon -- Pharyngeal -- Pharyngeal- Pudding Cup -- Pharyngeal -- Pharyngeal- Honey Teaspoon -- Pharyngeal -- Pharyngeal- Honey Cup -- Pharyngeal -- Pharyngeal- Nectar Teaspoon WFL Pharyngeal Material does not enter airway Pharyngeal- Nectar Cup [facility] Pharyngeal Material does not enter airway Pharyngeal- Nectar Straw WFL Pharyngeal Material does not enter airway Pharyngeal- Thin Teaspoon NT Pharyngeal -- Pharyngeal- Thin Cup [facility] Pharyngeal Material does not enter airway Pharyngeal- Thin Straw WFL Pharyngeal Material does not enter airway Pharyngeal- Puree WFL Pharyngeal Material does not enter airway Pharyngeal- Mechanical Soft  WFL Pharyngeal Material does not enter airway Pharyngeal- Regular -- Pharyngeal -- Pharyngeal- Multi-consistency -- Pharyngeal -- Pharyngeal- Pill -- Pharyngeal -- Pharyngeal Comment --  CHL IP CERVICAL ESOPHAGEAL PHASE 11/28/2019 Cervical Esophageal Phase Impaired Pudding Teaspoon -- Pudding Cup -- Honey Teaspoon -- Honey Cup -- Nectar Teaspoon -- Nectar Cup Reduced cricopharyngeal relaxation;Esophageal backflow into the pharynx;Prominent cricopharyngeal segment Nectar Straw Reduced cricopharyngeal relaxation;Esophageal backflow into the pharynx;Prominent cricopharyngeal segment Thin Teaspoon NT Thin Cup Reduced cricopharyngeal relaxation;Esophageal backflow into the pharynx;Prominent cricopharyngeal segment Thin Straw Reduced cricopharyngeal relaxation;Esophageal backflow into the pharynx;Prominent cricopharyngeal segment Puree Reduced cricopharyngeal relaxation;Prominent cricopharyngeal segment Mechanical Soft Reduced cricopharyngeal relaxation;Prominent cricopharyngeal segment Regular -- Multi-consistency -- Pill -- Cervical Esophageal Comment Pt with known Zenker's diverticulum resulting in backflow  of barium into pharynx, liquid swallows following solids does not improve clearance and increases backflow.  Dry swallows (pt states she needs to conduct) are helpful to decrease residuals.  At end of testing, pt denies sensation of residuals however she appeared with some retention continued. Macario Golds 11/28/2019, 9:33 AM      Kathleen Lime, MS [facility] SLP Acute Rehab Services Office [phone]              Scheduled Meds: . acetaminophen  500 mg Oral Q8H  . acidophilus  1 capsule Oral Daily  . amLODipine  5 mg Oral Daily  . apixaban  2.5 mg Oral BID  . vitamin C  500 mg Oral Daily  . cholecalciferol  2,000 Units Oral BID  . docusate sodium  100 mg Oral BID  . donepezil  10 mg Oral Daily  . lidocaine  1 patch Transdermal Q24H  . pantoprazole  40 mg Oral Daily  . senna  1 tablet Oral BID    Continuous Infusions:   Assessment/Plan: Right intertrochanteric hip fracture - Seen by Ortho, Status post IMN on 11/26/19-Weightbearing as tolerated to right leg. Continue vitamin D supplement home dosing, Ortho recommend DEXA scan in outpatient setting in 6 to 8 weeks. Vit D level 55 .   Mechanical fall: Daughter reports that patient had been complaining of feeling wobbly with no balance especially with changing positions (supine to sitting/standing).  Requested physical therapy to check orthostatics-sitting 115/68 with pulse 87, standing 135/77 with pulse 113.  Continue support stockings.  Discussed with patient and daughter regarding benefits and risks of of continuing and discontinuing anticoagulation.  Given isolated fall episode and patient's ability to catch herself, they would like to continue anticoagulation for now to mitigate thromboembolism risk.  Postoperative acute blood loss/dilutional anemia- Recieved PRBC transfusion on 3/21 for Hgb drop 12-->8.6-->7.6. Now improved to 10.5-->10.   History of PE and DVT on chronic anticoagulation with [facility] to resume Eliquis, accordingly resumed Eliquis at lowered dose 2.5 mg BID due to age and weight. Monitor for hgb stability  Recent urinary tract infection: Patient diagnosed with a urinary tract infection and had been started on cephalexin in the outpatient setting on 3/13. Patient does not report any active symptoms.  Resumed cephalexin while here to complete course-UA unremarkable on admission  Leucocytosis: likely reactive. Not POA but uptrend post op. Afebrile, u/a -VE.  Dehydration, poor appetite: Patient reporting not eating and drinking much. Received gentle hydration for 24 hours, encourage oral intake.  CKD 3a stable at baseline, renal dosing meds  Dementia: Patient has trouble with short-term memory, but long-term memory intact. She is on Aricept 10 mg daily.Not oriented to the year, oriented to month  place and person.Continue Aricept  Dysphagia: Patient reported issues taking a pill, stuck in her throat leaving a bitter taste and slight discomfort-Speech eval, to have modified barium study tomorrow  Chronic low back pain, K pad  Constipation: Stool softener    DVT prophylaxis: On oral anticoagulation Code Status: Full code Family / Patient Communication: Discussed with patient and daughter at bedside, all questions answered to satisfaction Disposition Plan:   Patient is from home prior to hospitalization. Received/Receiving inpatient care for acute hip fracture/post op anemia in the setting of chrobnic anticoagulation Discharge when safe disposition option available (SNF rehab). CIR accepted patient but insurance denied , tried peer-peer but denied anyway. CM looking for SNF rehab now     LOS: 3 days    Time spent:  54 min   Guilford Shi, MD [facility] Pager in [place]  If 7PM-7AM, please contact night-coverage [email or web address] 11/29/2019, 6:04 PM

## 2019-11-29 NOTE — Progress Notes (Signed)
Physical Therapy Treatment Patient Details Name: Susan Banks MRN: [id number] DOB: 02-03-1926 Today's Date: 11/29/2019    History of Present Illness Susan Banks is a 86 y.o. female with medical history significant of essential hypertension, hyperlipidemia, pulmonary embolus in 2018 on Eliquis, chronic kidney disease stage III, history of C. difficile, mild dementia, and GERD presents after having fall at home.  PTA pt reports completing most ADLs without assistance and ambulated without AD. Imaging demonstrated R hip fracture. Pt has lots 7 pounds since December. Pt underwent ORIF R hip 11/26/19.     PT Comments    Pt continues to make steady progress towards therapy goals. Today's session focused on further functional transfer training and bed mobility. Pt continues to report increased nausea with movement with vitals remaining >94% SPO2 and stable throughout tx. Pt demonstrated increased flatulence and eructation when transferring today, but no release of bodily fluids. Pt demonstrated improved ability to clear floor during transfer today but demonstrated no progress on distance able to traverse. OT updated on patient preferred transfer time via secure chat. PT to continue following patient to address below listed deficits prior to transition to next level of care.  Follow Up Recommendations  CIR     Equipment Recommendations  Other (comment)(TBD at next venue of care)    Recommendations for Other Services Rehab consult     Precautions / Restrictions Precautions Precautions: Anterior Hip Precaution Booklet Issued: No Restrictions Weight Bearing Restrictions: Yes RLE Weight Bearing: Weight bearing as tolerated    Mobility  Bed Mobility Overal bed mobility: Needs Assistance Bed Mobility: Sit to Supine       Sit to supine: Mod assist   General bed mobility comments: modA for LE management and guidance into bed.  Transfers Overall transfer level: Needs  assistance Equipment used: Rolling walker (2 wheeled) Transfers: Sit to/from [facility] Sit to Stand: Mod assist Stand pivot transfers: Mod assist       General transfer comment: Pt continues to require modA to L hip to assist with improved standing posture. Able to come to stand with elevated surface but requires cueing for improved postural control.  Ambulation/Gait     General Gait Details: Unable to perform today, limited by pain       Balance Overall balance assessment: Needs assistance Sitting-balance support: Single extremity supported;Feet supported Sitting balance-Leahy Scale: Fair Sitting balance - Comments: Required heavy LUE for balance initially, but able to progress to minimal UE usage. Limited primairily due to fatigue today. Postural control: Left lateral lean Standing balance support: Bilateral upper extremity supported Standing balance-Leahy Scale: Poor            Cognition Arousal/Alertness: Awake/alert Behavior During Therapy: WFL for tasks assessed/performed Overall Cognitive Status: History of cognitive impairments - at baseline           General Comments: Pt continued to be agreeable to therapy to get from bed to chair. Minimal memory issues noted today.             Pertinent Vitals/Pain Pain Assessment: Faces Faces Pain Scale: Hurts even more Pain Location: right hip Pain Descriptors / Indicators: Grimacing;Guarding Pain Intervention(s): Limited activity within patient's tolerance;Repositioned;Premedicated before session;Relaxation;Monitored during session           PT Goals (current goals can now be found in the care plan section) Acute Rehab PT Goals Patient Stated Goal: States she is very tired, but will work with PT today to see if she feels less pain in  the chair PT Goal Formulation: With patient/family Time For Goal Achievement: 12/11/19 Potential to Achieve Goals: Good Progress towards PT goals: Progressing  toward goals    Frequency    Min 5X/week      PT Plan Current plan remains appropriate       AM-PAC PT "6 Clicks" Mobility   Outcome Measure  Help needed turning from your back to your side while in a flat bed without using bedrails?: A Lot Help needed moving from lying on your back to sitting on the side of a flat bed without using bedrails?: A Lot Help needed moving to and from a bed to a chair (including a wheelchair)?: A Lot Help needed standing up from a chair using your arms (e.g., wheelchair or bedside chair)?: A Lot Help needed to walk in hospital room?: Total Help needed climbing 3-5 steps with a railing? : Total 6 Click Score: 10    End of Session Equipment Utilized During Treatment: Gait belt Activity Tolerance: Patient limited by fatigue;Treatment limited secondary to medical complications (Comment) Patient left: with SCD's reapplied;with call bell/phone within reach;with family/visitor present;in chair;with chair alarm set Nurse Communication: Mobility status PT Visit Diagnosis: Unsteadiness on feet (R26.81);Muscle weakness (generalized) (M62.81);History of falling (Z91.81);Difficulty in walking, not elsewhere classified (R26.2)     Time: [id number] PT Time Calculation (min) (ACUTE ONLY): 40 min  Charges:  $Therapeutic Activity: 38-52 mins                     Ann Held PT, DPT Acute Rehab [facility] Rehabilitation P: [phone]    Darrall Strey A Tamotsu Wiederholt 11/29/2019, 1:27 PM

## 2019-11-29 NOTE — Plan of Care (Signed)

## 2019-11-30 DIAGNOSIS — M6281 Muscle weakness (generalized): Secondary | ICD-10-CM | POA: Diagnosis not present

## 2019-11-30 DIAGNOSIS — R410 Disorientation, unspecified: Secondary | ICD-10-CM | POA: Diagnosis not present

## 2019-11-30 DIAGNOSIS — Z86718 Personal history of other venous thrombosis and embolism: Secondary | ICD-10-CM | POA: Diagnosis not present

## 2019-11-30 DIAGNOSIS — S7291XA Unspecified fracture of right femur, initial encounter for closed fracture: Secondary | ICD-10-CM | POA: Diagnosis not present

## 2019-11-30 DIAGNOSIS — Z743 Need for continuous supervision: Secondary | ICD-10-CM | POA: Diagnosis not present

## 2019-11-30 DIAGNOSIS — S72141A Displaced intertrochanteric fracture of right femur, initial encounter for closed fracture: Secondary | ICD-10-CM | POA: Diagnosis not present

## 2019-11-30 DIAGNOSIS — R279 Unspecified lack of coordination: Secondary | ICD-10-CM | POA: Diagnosis not present

## 2019-11-30 DIAGNOSIS — E441 Mild protein-calorie malnutrition: Secondary | ICD-10-CM | POA: Diagnosis not present

## 2019-11-30 DIAGNOSIS — N390 Urinary tract infection, site not specified: Secondary | ICD-10-CM | POA: Diagnosis not present

## 2019-11-30 DIAGNOSIS — N1831 Chronic kidney disease, stage 3a: Secondary | ICD-10-CM | POA: Diagnosis not present

## 2019-11-30 DIAGNOSIS — Z86711 Personal history of pulmonary embolism: Secondary | ICD-10-CM | POA: Diagnosis not present

## 2019-11-30 DIAGNOSIS — W19XXXD Unspecified fall, subsequent encounter: Secondary | ICD-10-CM | POA: Diagnosis not present

## 2019-11-30 DIAGNOSIS — S72141D Displaced intertrochanteric fracture of right femur, subsequent encounter for closed fracture with routine healing: Secondary | ICD-10-CM | POA: Diagnosis not present

## 2019-11-30 DIAGNOSIS — I10 Essential (primary) hypertension: Secondary | ICD-10-CM | POA: Diagnosis not present

## 2019-11-30 DIAGNOSIS — I129 Hypertensive chronic kidney disease with stage 1 through stage 4 chronic kidney disease, or unspecified chronic kidney disease: Secondary | ICD-10-CM | POA: Diagnosis not present

## 2019-11-30 DIAGNOSIS — E440 Moderate protein-calorie malnutrition: Secondary | ICD-10-CM | POA: Diagnosis not present

## 2019-11-30 DIAGNOSIS — R262 Difficulty in walking, not elsewhere classified: Secondary | ICD-10-CM | POA: Diagnosis not present

## 2019-11-30 DIAGNOSIS — W19XXXA Unspecified fall, initial encounter: Secondary | ICD-10-CM | POA: Diagnosis not present

## 2019-11-30 DIAGNOSIS — R278 Other lack of coordination: Secondary | ICD-10-CM | POA: Diagnosis not present

## 2019-11-30 DIAGNOSIS — Z741 Need for assistance with personal care: Secondary | ICD-10-CM | POA: Diagnosis not present

## 2019-11-30 DIAGNOSIS — F0150 Vascular dementia without behavioral disturbance: Secondary | ICD-10-CM | POA: Diagnosis not present

## 2019-11-30 DIAGNOSIS — E785 Hyperlipidemia, unspecified: Secondary | ICD-10-CM | POA: Diagnosis not present

## 2019-11-30 DIAGNOSIS — Z7901 Long term (current) use of anticoagulants: Secondary | ICD-10-CM | POA: Diagnosis not present

## 2019-11-30 DIAGNOSIS — Y92009 Unspecified place in unspecified non-institutional (private) residence as the place of occurrence of the external cause: Secondary | ICD-10-CM | POA: Diagnosis not present

## 2019-11-30 DIAGNOSIS — R4189 Other symptoms and signs involving cognitive functions and awareness: Secondary | ICD-10-CM | POA: Diagnosis not present

## 2019-11-30 DIAGNOSIS — K219 Gastro-esophageal reflux disease without esophagitis: Secondary | ICD-10-CM | POA: Diagnosis not present

## 2019-11-30 DIAGNOSIS — S72101A Unspecified trochanteric fracture of right femur, initial encounter for closed fracture: Secondary | ICD-10-CM | POA: Diagnosis not present

## 2019-11-30 DIAGNOSIS — R2681 Unsteadiness on feet: Secondary | ICD-10-CM | POA: Diagnosis not present

## 2019-11-30 DIAGNOSIS — F0390 Unspecified dementia without behavioral disturbance: Secondary | ICD-10-CM | POA: Diagnosis not present

## 2019-11-30 LAB — HEMOGLOBIN AND HEMATOCRIT, BLOOD
HCT: 32.6 % — ABNORMAL LOW (ref 36.0–46.0)
Hemoglobin: 10.5 g/dL — ABNORMAL LOW (ref 12.0–15.0)

## 2019-11-30 MED ORDER — AMLODIPINE BESYLATE 5 MG PO TABS: 5.0000 mg | ORAL_TABLET | Freq: Every day | ORAL | Status: DC

## 2019-11-30 MED ORDER — DOCUSATE SODIUM 100 MG PO CAPS: 100.0000 mg | ORAL_CAPSULE | Freq: Two times a day (BID) | ORAL | 0 refills | Status: DC

## 2019-11-30 MED ORDER — HYDROCODONE-ACETAMINOPHEN 5-325 MG PO TABS: 1.0000 | ORAL_TABLET | Freq: Four times a day (QID) | ORAL | 0 refills | Status: AC | PRN

## 2019-11-30 MED ORDER — LIDOCAINE 5 % EX PTCH: 1.0000 | MEDICATED_PATCH | CUTANEOUS | 0 refills | Status: DC

## 2019-11-30 MED ORDER — ACETAMINOPHEN 500 MG PO TABS: 500.0000 mg | ORAL_TABLET | Freq: Three times a day (TID) | ORAL | 0 refills | Status: AC

## 2019-11-30 MED ORDER — ASCORBIC ACID 500 MG PO TABS: 500.0000 mg | ORAL_TABLET | Freq: Every day | ORAL | 0 refills | Status: DC

## 2019-11-30 NOTE — TOC Transition Note (Signed)
Transition of Care [facility]) - CM/SW Discharge Note   Patient Details  Name: ADYLINE GRANDT MRN: [id number] Date of Birth: 03-21-1926  Transition of Care [facility]) CM/SW Contact:  Sharin Mons, RN Phone Number: 11/30/2019, 10:22 AM   Clinical Narrative:    Patient will DC to: [facility] Anticipated DC date:11/30/2019 Family notified: Alonna Minium by: Corey Harold   Per MD patient ready for DC today . RN, patient, patient's family, and facility notified of DC. Discharge Summary and FL2 sent to facility. RN to call report prior to discharge [phone]). DC packet on chart. Ambulance transport requested for patient.   RNCM will sign off for now as intervention is no longer needed. Please consult [place] again if new needs arise.    Final next level of care: Skilled Nursing Facility([facility]) Barriers to Discharge: No Barriers Identified   Patient Goals and CMS Choice        Discharge Placement                       Discharge Plan and Services        Social Determinants of Health (SDOH) Interventions     Readmission Risk Interventions No flowsheet data found.

## 2019-11-30 NOTE — Plan of Care (Addendum)
Pt discharging to SNF, called report to [facility], [place], Therapist, sports. Discharge instructions explained to pt/family and understanding verbalized. Packed all personal belongings. No further questions or concerns. Awaiting PTAR for transportation 12:30.   Problem: Education: Goal: Knowledge of General Education information will improve Description: Including pain rating scale, medication(s)/side effects and non-pharmacologic comfort measures Outcome: Completed/Met   Problem: Health Behavior/Discharge Planning: Goal: Ability to manage health-related needs will improve Outcome: Completed/Met   Problem: Clinical Measurements: Goal: Ability to maintain clinical measurements within normal limits will improve Outcome: Completed/Met Goal: Will remain free from infection Outcome: Completed/Met Goal: Diagnostic test results will improve Outcome: Completed/Met Goal: Respiratory complications will improve Outcome: Completed/Met Goal: Cardiovascular complication will be avoided Outcome: Completed/Met   Problem: Activity: Goal: Risk for activity intolerance will decrease Outcome: Completed/Met   Problem: Nutrition: Goal: Adequate nutrition will be maintained Outcome: Completed/Met   Problem: Coping: Goal: Level of anxiety will decrease Outcome: Completed/Met   Problem: Elimination: Goal: Will not experience complications related to bowel motility Outcome: Completed/Met Goal: Will not experience complications related to urinary retention Outcome: Completed/Met   Problem: Pain Managment: Goal: General experience of comfort will improve Outcome: Completed/Met   Problem: Safety: Goal: Ability to remain free from injury will improve Outcome: Completed/Met   Problem: Skin Integrity: Goal: Risk for impaired skin integrity will decrease Outcome: Completed/Met

## 2019-11-30 NOTE — Progress Notes (Signed)
PT Cancellation Note  Patient Details Name: Susan Banks MRN: [id number] DOB: 1926/04/29   Cancelled Treatment:    Reason Eval/Treat Not Completed: Patient declined, no reason specified Pt states that she will be transferring to SNF today and wants to conserve energy for anticipated participation in therapy upon arrival. Pt states no questions or concerns at this time.  Ann Held PT, DPT [facility]: [phone]   Renato Gails 11/30/2019, 10:19 AM

## 2019-11-30 NOTE — Discharge Instructions (Signed)
Orthopaedic Trauma Service Discharge Instructions   General Discharge Instructions  Orthopaedic Injuries:  Right intertrochanteric femur fracture (right hip fracture) treated with intramedullary nailing   WEIGHT BEARING STATUS: Weightbearing as tolerated with assistance (walker)  RANGE OF MOTION/ACTIVITY: unrestricted range of motion Right hip and knee.  Increase activity slowly. Daily therapy   Bone health: vitamin d levels look good. Continue with vitamin d supplementation   Wound Care: daily wound care R leg starting on 12/01/2019. See below.  Ok to leave surgical wounds open to air if no drainage present  Discharge Wound Care Instructions  Do NOT apply any ointments, solutions or lotions to pin sites or surgical wounds.  These prevent needed drainage and even though solutions like hydrogen peroxide kill bacteria, they also damage cells lining the pin sites that help fight infection.  Applying lotions or ointments can keep the wounds moist and can cause them to breakdown and open up as well. This can increase the risk for infection. When in doubt call the office.  Surgical incisions should be dressed daily starting on 12/01/2019.   If any drainage is noted, use one layer of adaptic, 4x4's and tape.  Would also recommend TED hose for swelling control and DVT prophylaxis   Once the incision is completely dry and without drainage, it may be left open to air out.  Showering may begin 36-48 hours later.  Cleaning gently with soap and water.   DVT/PE prophylaxis: eliquis as you were on prior to admission.  Continue per prescribing provider   Diet: as you were eating previously.  Can use over the counter stool softeners and bowel preparations, such as Miralax, to help with bowel movements.  Narcotics can be constipating.  Be sure to drink plenty of fluids  PAIN MEDICATION USE AND EXPECTATIONS  You have likely been given narcotic medications to help control your pain.  After a traumatic  event that results in an fracture (broken bone) with or without surgery, it is ok to use narcotic pain medications to help control one's pain.  We understand that everyone responds to pain differently and each individual patient will be evaluated on a regular basis for the continued need for narcotic medications. Ideally, narcotic medication use should last no more than 6-8 weeks (coinciding with fracture healing).   As a patient it is your responsibility as well to monitor narcotic medication use and report the amount and frequency you use these medications when you come to your office visit.   We would also advise that if you are using narcotic medications, you should take a dose prior to therapy to maximize you participation.  IF YOU ARE ON NARCOTIC MEDICATIONS IT IS NOT PERMISSIBLE TO OPERATE A MOTOR VEHICLE (MOTORCYCLE/CAR/TRUCK/MOPED) OR HEAVY MACHINERY DO NOT MIX NARCOTICS WITH OTHER CNS (CENTRAL NERVOUS SYSTEM) DEPRESSANTS SUCH AS ALCOHOL   STOP SMOKING OR USING NICOTINE PRODUCTS!!!!  As discussed nicotine severely impairs your body's ability to heal surgical and traumatic wounds but also impairs bone healing.  Wounds and bone heal by forming microscopic blood vessels (angiogenesis) and nicotine is a vasoconstrictor (essentially, shrinks blood vessels).  Therefore, if vasoconstriction occurs to these microscopic blood vessels they essentially disappear and are unable to deliver necessary nutrients to the healing tissue.  This is one modifiable factor that you can do to dramatically increase your chances of healing your injury.    (This means no smoking, no nicotine gum, patches, etc)  DO NOT USE NONSTEROIDAL ANTI-INFLAMMATORY DRUGS (NSAID'S)  Using products  such as Advil (ibuprofen), Aleve (naproxen), Motrin (ibuprofen) for additional pain control during fracture healing can delay and/or prevent the healing response.  If you would like to take over the counter (OTC) medication, Tylenol  (acetaminophen) is ok.  However, some narcotic medications that are given for pain control contain acetaminophen as well. Therefore, you should not exceed more than 4000 mg of tylenol in a day if you do not have liver disease.  Also note that there are may OTC medicines, such as cold medicines and allergy medicines that my contain tylenol as well.  If you have any questions about medications and/or interactions please ask your doctor/PA or your pharmacist.      ICE AND ELEVATE INJURED/OPERATIVE EXTREMITY  Using ice and elevating the injured extremity above your heart can help with swelling and pain control.  Icing in a pulsatile fashion, such as 20 minutes on and 20 minutes off, can be followed.    Do not place ice directly on skin. Make sure there is a barrier between to skin and the ice pack.    Using frozen items such as frozen peas works well as the conform nicely to the are that needs to be iced.  USE AN ACE WRAP OR TED HOSE FOR SWELLING CONTROL  In addition to icing and elevation, Ace wraps or TED hose are used to help limit and resolve swelling.  It is recommended to use Ace wraps or TED hose until you are informed to stop.    When using Ace Wraps start the wrapping distally (farthest away from the body) and wrap proximally (closer to the body)   Example: If you had surgery on your leg or thing and you do not have a splint on, start the ace wrap at the toes and work your way up to the thigh        If you had surgery on your upper extremity and do not have a splint on, start the ace wrap at your fingers and work your way up to the upper arm  IF YOU ARE IN A SPLINT OR CAST DO NOT [place]   If your splint gets wet for any reason please contact the office immediately. You may shower in your splint or cast as long as you keep it dry.  This can be done by wrapping in a cast cover or garbage back (or similar)  Do Not stick any thing down your splint or cast such as pencils, money, or  hangers to try and scratch yourself with.  If you feel itchy take benadryl as prescribed on the bottle for itching  IF YOU ARE IN A CAM BOOT (BLACK BOOT)  You may remove boot periodically. Perform daily dressing changes as noted below.  Wash the liner of the boot regularly and wear a sock when wearing the boot. It is recommended that you sleep in the boot until told otherwise    Call office for the following:  Temperature greater than 101F  Persistent nausea and vomiting  Severe uncontrolled pain  Redness, tenderness, or signs of infection (pain, swelling, redness, odor or green/yellow discharge around the site)  Difficulty breathing, headache or visual disturbances  Hives  Persistent dizziness or light-headedness  Extreme fatigue  Any other questions or concerns you may have after discharge  In an emergency, call 911 or go to an Emergency Department at a nearby hospital    [facility]: [phone]   VISIT  OUR WEBSITE FOR ADDITIONAL INFORMATION: [email or web address]

## 2019-11-30 NOTE — Progress Notes (Signed)
Insurance authorization received for SNF ( [id number] x 7 days) and non emergence transportation [id number]). Whitman Hero RN,BSN,CM [phone]

## 2019-11-30 NOTE — Discharge Summary (Addendum)
Physician Discharge Summary  Susan Banks [id number] DOB: [birth year] DOA: 11/26/2019  PCP: Kelton Pillar, MD  Admit date: 11/26/2019 Discharge date: 11/30/2019 Consultations: [facility] Admitted From: home Disposition: SNF  Discharge Diagnoses:  Principal Problem:   Fracture, intertrochanteric, right femur ([place]) Active Problems:   HTN (hypertension)   Chronic kidney disease, stage 3   GERD (gastroesophageal reflux disease)   Chronic anticoagulation   History of pulmonary embolus (PE)   History of DVT (deep vein thrombosis)   Fall at home, initial encounter   Hospital Course Summary: 86 y.o.femalewith medical history significant ofessential hypertension, hyperlipidemia, pulmonary embolus in 2018 on Eliquis, chronic kidney disease stage III,history of C. difficile, mild dementia, and GERD presented to ED from home after a fall.Found to have right anterior trochanteric hip fracture,status post IM and on March 20,recieved blood transfusion 3/21 for postop anemia,family requestedrehab placement as daughter has to work and unable to provide 24 hr supervision.  Right intertrochanteric hip fracture - Seen by Ortho, Status post IMN on 11/26/19-Weightbearing as tolerated to right leg. Continue vitamin D supplement home dosing,Ortho recommend DEXA scan in outpatient setting in 6 to 8 weeks. Vit D level 55 . F/U ortho clinic in 2 weeks  Mechanical fall: Daughter reports that patient had been complaining of feeling wobbly with no balance especially with changing positions (supine to sitting/standing).  Requested physical therapy to check orthostatics-sitting 115/68 with pulse 87, standing 135/77 with pulse 113.  Continue support stockings.  Discussed with patient and daughter regarding benefits and risks of of continuing and discontinuing anticoagulation.  Given isolated fall episode and patient's ability to catch herself, they would like to continue anticoagulation  for now to mitigate thromboembolism risk. I have advised patient/family to follow up PCP/hematology as outpatient regarding intended length of anticoagulation given episode of DVT/PE in 2018 (6 months vs life long)  Postoperative acute blood loss/dilutional anemia- Recieved PRBC transfusion on 3/21 for Hgb drop 12-->8.6-->7.6. Now improved and stable at 10.5.   History of PE and DVT on chronic anticoagulation with [facility] to resume Eliquis, accordingly resumed Eliquis at lowered dose 84 mg BID due to age and weight. Monitor for hgb stability with periodic labs as outpatient.I have advised patient/family to follow up PCP/hematology as outpatient regarding intended length of anticoagulation given episode of DVT/PE in 2018 (6 months vs life long)  Recent urinary tract infection: Patient diagnosed with a urinary tract infection and had been started on cephalexin in the outpatient setting on 3/13. Patient does not report any active symptoms.  Resumed cephalexin while here to complete course-UA unremarkable on admission  Leucocytosis: likely reactive. Not POA but uptrend post op. Afebrile, u/a -VE.  Dehydration, poor appetite: Patient reporting not eating and drinking much. Received gentle hydration for 24 hours,encourage oral intake.  CKD 3astable at baseline,renal dosing meds  Dementia: Patient has trouble with short-term memory, but long-term memory intact. She is on Aricept 10 mg daily.Not oriented to the year,oriented to month place and person.Continue Aricept  Dysphagia: Patient reported issues taking a pill, stuck in her throat leaving a bitter taste and slight discomfort-Speech eval,to have modified barium study tomorrow  Chronic low back pain,K pad  Constipation:Stool softener   Discharge Exam:  Vitals:   11/30/19 0800 11/30/19 1117  BP: (!) 140/56   Pulse: 76 (!) 102  Resp: 15   Temp: 98.3 F (36.8 C)   SpO2: (!) 79% 97%   Vitals:   11/29/19  1943 11/30/19 0410 11/30/19 0800 11/30/19 1117  BP: 132/70 (!) 143/65 (!) 140/56   Pulse: 97 68 76 (!) 102  Resp: 14 14 15    Temp: 98.5 F (36.9 C) 97.7 F (36.5 C) 98.3 F (36.8 C)   TempSrc: Oral Oral Oral   SpO2: 97% 95% (!) 79% 97%  Weight:      Height:        General: Pt is alert, awake, not in acute distress Cardiovascular: RRR, S1/S2 +, no rubs, no gallops Respiratory: CTA bilaterally, no wheezing, no rhonchi Abdominal: Soft, NT, ND, bowel sounds + Extremities: no edema, no cyanosis. S/p rt hip surgery  Discharge Condition:Stable CODE STATUS: Full code Diet recommendation: heart healthy diet Recommendations for Outpatient Follow-up:  1. Follow up with PCP: at SNF and upon discharge with primary MD 2. Follow up with consultants: Orthopedics 7-10 days 3. Please obtain follow up labs including: Dexa scan in 6-8 weeks, stop date on Eliquis     Discharge Instructions:  Discharge Instructions    Call MD for:  difficulty breathing, headache or visual disturbances   Complete by: As directed    Call MD for:  extreme fatigue   Complete by: As directed    Call MD for:  persistant nausea and vomiting   Complete by: As directed    Call MD for:  redness, tenderness, or signs of infection (pain, swelling, redness, odor or green/yellow discharge around incision site)   Complete by: As directed    Call MD for:  severe uncontrolled pain   Complete by: As directed    Call MD for:  temperature >100.4   Complete by: As directed    Diet - low sodium heart healthy   Complete by: As directed    Increase activity slowly   Complete by: As directed      Allergies as of 11/30/2019      Reactions   Codeine Itching, Other (See Comments)   Has not tried benadryl along with medication Has not tried benadryl along with medication   Sulfa Antibiotics Nausea And Vomiting   Tramadol Hcl Itching   Tramadol Hcl Itching      Medication List    STOP taking these medications   cephALEXin  250 MG capsule Commonly known as: KEFLEX     TAKE these medications   acetaminophen 500 MG tablet Commonly known as: TYLENOL Take 1 tablet (500 mg total) by mouth every 8 (eight) hours.   amLODipine 5 MG tablet Commonly known as: NORVASC Take 1 tablet (5 mg total) by mouth daily.   apixaban 5 MG Tabs tablet Commonly known as: ELIQUIS 2 tablets orally twice a day for six days then one tablet twice a day afterwards What changed:   how much to take  how to take this  when to take this  additional instructions   ascorbic acid 500 MG tablet Commonly known as: VITAMIN C Take 1 tablet (500 mg total) by mouth daily.   cholecalciferol 25 MCG (1000 UNIT) tablet Commonly known as: VITAMIN D Take 1,000 Units by mouth daily.   docusate sodium 100 MG capsule Commonly known as: COLACE Take 1 capsule (100 mg total) by mouth 2 (two) times daily.   donepezil 10 MG tablet Commonly known as: ARICEPT Take 1 tablet (10 mg total) by mouth daily.   HYDROcodone-acetaminophen 5-325 MG tablet Commonly known as: NORCO/VICODIN Take 1-2 tablets by mouth every 6 (six) hours as needed for moderate pain or severe pain. What changed:   how much to take  when to  take this  reasons to take this   lidocaine 5 % Commonly known as: LIDODERM Place 1 patch onto the skin daily. Remove & Discard patch within 12 hours or as directed by MD   omeprazole 20 MG capsule Commonly known as: PRILOSEC Take 20 mg by mouth daily.      Follow-up Information    Altamese [place], MD. Schedule an appointment as soon as possible for a visit in 2 week(s).   Specialty: Orthopedic Surgery Contact information: [place] [address] [phone]          Allergies  Allergen Reactions  . Codeine Itching and Other (See Comments)    Has not tried benadryl along with medication Has not tried benadryl along with medication  . Sulfa Antibiotics Nausea And Vomiting  . Tramadol Hcl Itching   . Tramadol Hcl Itching      The results of significant diagnostics from this hospitalization (including imaging, microbiology, ancillary and laboratory) are listed below for reference.    Labs: BNP (last 3 results) No results for input(s): BNP in the last 8760 hours. Basic Metabolic Panel: Recent Labs  Lab 11/26/19 0441 11/27/19 0410 11/28/19 0215  NA 139 138 140  K 3.8 5.1 4.2  CL 105 105 107  CO2  --  23 24  GLUCOSE 114* 174* 109*  BUN 26* 21 21  CREATININE 1.00 1.11* 1.07*  CALCIUM  --  9.7 9.2   Liver Function Tests: No results for input(s): AST, ALT, ALKPHOS, BILITOT, PROT, ALBUMIN in the last 168 hours. No results for input(s): LIPASE, AMYLASE in the last 168 hours. No results for input(s): AMMONIA in the last 168 hours. CBC: Recent Labs  Lab 11/26/19 0403 11/26/19 0441 11/26/19 2131 11/27/19 0410 11/28/19 0215 11/29/19 0403 11/30/19 1022  WBC 7.9  --   --  12.5* 15.1* 12.6*  --   NEUTROABS 5.4  --   --   --   --   --   --   HGB 12.0   < > 8.6* 7.6* 10.5* 10.0* 10.5*  HCT 36.9   < > 26.4* 23.2* 31.4* 30.6* 32.6*  MCV 92.0  --   --  92.8 90.0 91.3  --   PLT 220  --   --  153 121* 139*  --    < > = values in this interval not displayed.   Cardiac Enzymes: No results for input(s): CKTOTAL, CKMB, CKMBINDEX, TROPONINI in the last 168 hours. BNP: Invalid input(s): POCBNP CBG: No results for input(s): GLUCAP in the last 168 hours. D-Dimer No results for input(s): DDIMER in the last 72 hours. Hgb A1c No results for input(s): HGBA1C in the last 72 hours. Lipid Profile No results for input(s): CHOL, HDL, LDLCALC, TRIG, CHOLHDL, LDLDIRECT in the last 72 hours. Thyroid function studies No results for input(s): TSH, T4TOTAL, T3FREE, THYROIDAB in the last 72 hours.  Invalid input(s): FREET3 Anemia work up No results for input(s): VITAMINB12, FOLATE, FERRITIN, TIBC, IRON, RETICCTPCT in the last 72 hours. Urinalysis    Component Value Date/Time   COLORURINE  YELLOW 11/26/2019 1224   APPEARANCEUR CLEAR 11/26/2019 1224   LABSPEC 1.012 11/26/2019 1224   PHURINE 8.0 11/26/2019 1224   GLUCOSEU NEGATIVE 11/26/2019 1224   HGBUR NEGATIVE 11/26/2019 1224   BILIRUBINUR NEGATIVE 11/26/2019 1224   [place] 11/26/2019 1224   PROTEINUR NEGATIVE 11/26/2019 1224   UROBILINOGEN 0.2 06/13/2015 2025   NITRITE NEGATIVE 11/26/2019 [place] 11/26/2019 1224  Sepsis Labs Invalid input(s): PROCALCITONIN,  WBC,  LACTICIDVEN Microbiology Recent Results (from the past 240 hour(s))  Respiratory Panel by RT PCR (Flu A&B, Covid) - Nasopharyngeal Swab     Status: None   Collection Time: 11/26/19  4:13 AM   Specimen: Nasopharyngeal Swab  Result Value Ref Range Status   SARS Coronavirus 2 by RT PCR NEGATIVE NEGATIVE Final    Comment: (NOTE) SARS-CoV-2 target nucleic acids are NOT DETECTED. The SARS-CoV-2 RNA is generally detectable in upper respiratoy specimens during the acute phase of infection. The lowest concentration of SARS-CoV-2 viral copies this assay can detect is 131 copies/mL. A negative result does not preclude SARS-Cov-2 infection and should not be used as the sole basis for treatment or other patient management decisions. A negative result may occur with  improper specimen collection/handling, submission of specimen other than nasopharyngeal swab, presence of viral mutation(s) within the areas targeted by this assay, and inadequate number of viral copies (<131 copies/mL). A negative result must be combined with clinical observations, patient history, and epidemiological information. The expected result is Negative. Fact Sheet for Patients:  PinkCheek.be Fact Sheet for Healthcare Providers:  GravelBags.it This test is not yet ap proved or cleared by the [place] FDA and  has been authorized for detection and/or diagnosis of SARS-CoV-2 by FDA under an  Emergency Use Authorization (EUA). This EUA will remain  in effect (meaning this test can be used) for the duration of the COVID-19 declaration under Section 564(b)(1) of the Act, 21 U.S.C. section 360bbb-3(b)(1), unless the authorization is terminated or revoked sooner.    Influenza A by PCR NEGATIVE NEGATIVE Final   Influenza B by PCR NEGATIVE NEGATIVE Final    Comment: (NOTE) The Xpert Xpress SARS-CoV-2/FLU/RSV assay is intended as an aid in  the diagnosis of influenza from Nasopharyngeal swab specimens and  should not be used as a sole basis for treatment. Nasal washings and  aspirates are unacceptable for Xpert Xpress SARS-CoV-2/FLU/RSV  testing. Fact Sheet for Patients: PinkCheek.be Fact Sheet for Healthcare Providers: GravelBags.it This test is not yet approved or cleared by the [place] FDA and  has been authorized for detection and/or diagnosis of SARS-CoV-2 by  FDA under an Emergency Use Authorization (EUA). This EUA will remain  in effect (meaning this test can be used) for the duration of the  Covid-19 declaration under Section 564(b)(1) of the Act, 21  U.S.C. section 360bbb-3(b)(1), unless the authorization is  terminated or revoked. Performed at [facility], [place] [address], [place], [place] [address]   Surgical pcr screen     Status: None   Collection Time: 11/26/19  8:58 AM   Specimen: Nasal Mucosa; Nasal Swab  Result Value Ref Range Status   MRSA, PCR NEGATIVE NEGATIVE Final   Staphylococcus aureus NEGATIVE NEGATIVE Final    Comment: (NOTE) The Xpert SA Assay (FDA approved for NASAL specimens in patients 28 years of age and older), is one component of a comprehensive surveillance program. It is not intended to diagnose infection nor to guide or monitor treatment. Performed at [facility], [place] [address] [place], [place] [address]   SARS CORONAVIRUS 2 (TAT 6-24 HRS) Nasopharyngeal  Nasopharyngeal Swab     Status: None   Collection Time: 11/29/19  5:12 PM   Specimen: Nasopharyngeal Swab  Result Value Ref Range Status   SARS Coronavirus 2 NEGATIVE NEGATIVE Final    Comment: (NOTE) SARS-CoV-2 target nucleic acids are NOT DETECTED. The SARS-CoV-2 RNA is  generally detectable in upper and lower respiratory specimens during the acute phase of infection. Negative results do not preclude SARS-CoV-2 infection, do not rule out co-infections with other pathogens, and should not be used as the sole basis for treatment or other patient management decisions. Negative results must be combined with clinical observations, patient history, and epidemiological information. The expected result is Negative. Fact Sheet for Patients: SugarRoll.be Fact Sheet for Healthcare Providers: https://www.woods-mathews.com/ This test is not yet approved or cleared by the [place] FDA and  has been authorized for detection and/or diagnosis of SARS-CoV-2 by FDA under an Emergency Use Authorization (EUA). This EUA will remain  in effect (meaning this test can be used) for the duration of the COVID-19 declaration under Section 56 4(b)(1) of the Act, 21 U.S.C. section 360bbb-3(b)(1), unless the authorization is terminated or revoked sooner. Performed at [facility], [place] [address], [place], [place] [address]     Procedures/Studies: DG Chest 1 View  Result Date: 11/26/2019 CLINICAL DATA:  Chest pain EXAM: CHEST  1 VIEW COMPARISON:  None. FINDINGS: The heart size and mediastinal contours are within normal limits. Both lungs are clear. The visualized skeletal structures are unremarkable. IMPRESSION: No active disease. Electronically Signed   By: Ulyses Jarred M.D.   On: 11/26/2019 06:16   CT Head Wo Contrast  Result Date: 11/26/2019 CLINICAL DATA:  Pain after fall. EXAM: CT HEAD WITHOUT CONTRAST CT CERVICAL SPINE WITHOUT CONTRAST TECHNIQUE:  Multidetector CT imaging of the head and cervical spine was performed following the standard protocol without intravenous contrast. Multiplanar CT image reconstructions of the cervical spine were also generated. COMPARISON:  September 23, 2017 CT of the head FINDINGS: CT HEAD FINDINGS Brain: No subdural, epidural, or subarachnoid hemorrhage. Ventricles and sulci are prominent but stable. Cerebellum, brainstem, and basal cisterns are unremarkable. White matter changes are identified, moderate. No acute cortical ischemia or infarct. No mass effect or midline shift. Vascular: Calcified atherosclerosis is seen in the intracranial carotids. Skull: Normal. Negative for fracture or focal lesion. Sinuses/Orbits: No acute finding. Other: None. CT CERVICAL SPINE FINDINGS Alignment: 3 mm of anterolisthesis of C3 versus C4. 2 mm of anterolisthesis of C4 versus C5. No other significant malalignment. Skull base and vertebrae: No fractures. Soft tissues and spinal canal: No prevertebral fluid or swelling. No visible canal hematoma. Disc levels: Multilevel degenerative disc disease and facet degenerative changes. Upper chest: Negative. Other: No other abnormalities. IMPRESSION: 1. No acute intracranial abnormality. 2. No fracture or traumatic malalignment in the cervical spine. Significant degenerative disc disease and facet degenerative changes. Mild anterolisthesis of C3 versus C4 and C4 versus C5 is likely due to the facet degenerative changes. Electronically Signed   By: Dorise Bullion III M.D   On: 11/26/2019 05:50   CT Cervical Spine Wo Contrast  Result Date: 11/26/2019 CLINICAL DATA:  Fall EXAM: CT CERVICAL SPINE WITHOUT CONTRAST TECHNIQUE: Multidetector CT imaging of the cervical spine was performed without intravenous contrast. Multiplanar CT image reconstructions were also generated. COMPARISON:  None. FINDINGS: Alignment: Grade 1 anterolisthesis at C3-4 and C4-5 due to facet hypertrophy. Skull base and vertebrae: No  acute fracture. No primary bone lesion or focal pathologic process. Soft tissues and spinal canal: Hypertrophic pannus surrounding the odontoid process. Disc levels:  Left C4-5 facet fusion. No bony spinal canal stenosis. Upper chest: Negative. Other: None. IMPRESSION: 1. No acute fracture of the cervical spine. 2. Grade 1 anterolisthesis at C3-4 and C4-5 due to facet hypertrophy. Electronically Signed  By: Ulyses Jarred M.D.   On: 11/26/2019 05:45   Pelvis Portable  Result Date: 11/26/2019 CLINICAL DATA:  Postoperative evaluation. EXAM: PORTABLE PELVIS 1-2 VIEWS COMPARISON:  None. FINDINGS: A radiopaque intramedullary rod and compression screw device are seen within the proximal right femur. Acute inter trochanteric fracture of the proximal right femur is also seen with anatomic alignment. There is no evidence of dislocation. A mild-to-moderate amount of soft tissue air is seen along the lateral aspect of the proximal right lower extremity. IMPRESSION: Open reduction and internal fixation of the proximal right femur. Electronically Signed   By: Virgina Norfolk M.D.   On: 11/26/2019 17:13   DG Knee Right Port  Result Date: 11/26/2019 CLINICAL DATA:  Postoperative evaluation. EXAM: PORTABLE RIGHT KNEE - 1-2 VIEW COMPARISON:  None. FINDINGS: A radiopaque intramedullary rod is seen within the visualized portion of the right femoral shaft. A distal radiopaque fixation screw is also noted. No evidence of acute fracture, dislocation, or joint effusion. Mild arthropathy is seen involving the medial and lateral tibiofemoral compartment spaces. There is mild vascular calcification. IMPRESSION: Open reduction internal fixation of the proximal right femur. Electronically Signed   By: Virgina Norfolk M.D.   On: 11/26/2019 17:12   DG Swallowing Func-Speech Pathology  Result Date: 11/28/2019 Objective Swallowing Evaluation: Type of Study: MBS-Modified Barium Swallow Study  Patient Details Name: JAYSON MCNEASE  MRN: [id number] Date of Birth: 07/07/1926 Today's Date: 11/28/2019 Time: SLP Start Time (ACUTE ONLY): [id number] -SLP Stop Time (ACUTE ONLY): 0850 SLP Time Calculation (min) (ACUTE ONLY): 15 min Past Medical History: Past Medical History: Diagnosis Date . C. difficile diarrhea  . Chronic anticoagulation  . Chronic kidney disease  . Constipation  . GERD (gastroesophageal reflux disease)  . Hypercholesteremia  . Hypertension  . Left leg DVT ([place]) 08/2017 . Pulmonary embolism ([place]) 08/2017 . Shortness of breath  Past Surgical History: Past Surgical History: Procedure Laterality Date . ABDOMINAL SURGERY   . ESOPHAGOGASTRODUODENOSCOPY N/A 07/04/2014  Procedure: ESOPHAGOGASTRODUODENOSCOPY (EGD);  Surgeon: Laverda Page, MD;  Location: [facility];  Service: Cardiovascular;  Laterality: N/A; . TEE WITHOUT CARDIOVERSION N/A 06/13/2014  Procedure: TRANSESOPHAGEAL ECHOCARDIOGRAM (TEE);  Surgeon: Laverda Page, MD;  Location: [facility];  Service: Cardiovascular;  Laterality: N/A; HPI: DESDEMONA HOLSOPPLE is a 86 y.o. female with medical history significant of essential hypertension, hyperlipidemia, pulmonary embolus in 2018 on Eliquis, chronic kidney disease stage III, history of C. difficile, mild dementia, and GERD presents after having fall at home.  History is obtained from the patient with assistance from her daughter present at bedside.  She lives at home with her husband and is able to complete most of her ADLs without assistance.  She does not use a walker or cane.  This morning around 2 AM she had gone to use the bathroom when she states that her legs gave out on her.  She did not hit her head or lose consciousness and reported having severe pain in the right hip area.  Patient was not able to ambulate.  She had been seen recently by her primary care provider 7 days ago and diagnosed with a UTI.  They started her on 7-day course of cephalexin, but was reported that cultures did not grow out much.  Patient has also not  been eating or drinking much, and has had about a 7 pound weight loss since December.  Patient had also been question to possibly be having vertigo, but symptoms also were possibly related to  dehydration.  Other associated symptoms include nausea, some constipation, and insomnia.  ED Course: Upon admission into the emergency department patient was found to be mildly tachypneic with all other vital signs relatively within normal limits.  Labs were relatively unremarkable.  X-rays of the right hip revealed an acute right intertrochanteric femur fracture.  She was given fentanyl for pain.  Most recent chest xray was showing no active disease.  Subjective: pt upright in flouro chair Assessment / Plan / Recommendation CHL IP CLINICAL IMPRESSIONS 11/28/2019 Clinical Impression Patient presents with functional oropharyngeal swallow and known cervical esophageal dysphagia- Zenker's diverticulum. Decreased UES relaxation and Zenker's results in backflow of barium into pharynx - with inconsistent pt awareness.  Liquid swallows following solids does not improve clearance and increases backflow.  Dry swallows (pt states she needs to conduct) are helpful to decrease residuals.  No aspiration/penetration of any consistency tested noted but she is at risk with her Zenker's.  At end of testing, pt denies sensation of residuals however minimal retention continued.  Advised she needs her pills to be crushed and to conduct dry swallows with each bolus.  Educated pt to her lack of sensation to minimal retention remained - and advised she consume small amounts t/o the day.  Informed pt and her daughter [name] to her increased asp pna risk if she does aspirate and advised that caution with po is indicated.  Concern for pt's adequacy of nutrition given her effort with po intake - thus advised maximize Ensure, liquid nutrition as much as able.  Suspect backflow of po is causing pt to become "choked".   Upon esophageal sweep, pt appeared clear-  no radiologist present to confirm. SLP Visit Diagnosis Dysphagia, unspecified (R13.10) Attention and concentration deficit following -- Frontal lobe and executive function deficit following -- Impact on safety and function Moderate aspiration risk   CHL IP TREATMENT RECOMMENDATION 11/28/2019 Treatment Recommendations No treatment recommended at this time   Prognosis 11/28/2019 Prognosis for Safe Diet Advancement Good Barriers to Reach Goals Time post onset Barriers/Prognosis Comment -- CHL IP DIET RECOMMENDATION 11/28/2019 SLP Diet Recommendations Dysphagia 3 (Mech soft) solids;Thin liquid Liquid Administration via Cup;Straw Medication Administration Other (Comment) Compensations Slow rate;Small sips/bites Postural Changes Remain semi-upright after after feeds/meals (Comment);Seated upright at 90 degrees   CHL IP OTHER RECOMMENDATIONS 11/28/2019 Recommended Consults -- Oral Care Recommendations Oral care BID Other Recommendations --   CHL IP FOLLOW UP RECOMMENDATIONS 11/28/2019 Follow up Recommendations None   No flowsheet data found.     CHL IP ORAL PHASE 11/28/2019 Oral Phase WFL Oral - Pudding Teaspoon -- Oral - Pudding Cup -- Oral - Honey Teaspoon -- Oral - Honey Cup -- Oral - Nectar Teaspoon -- Oral - Nectar Cup -- Oral - Nectar Straw WFL Oral - Thin Teaspoon -- Oral - Thin Cup WFL Oral - Thin Straw WFL Oral - Puree WFL Oral - Mech Soft WFL Oral - Regular -- Oral - Multi-Consistency -- Oral - Pill -- Oral Phase - Comment --  CHL IP PHARYNGEAL PHASE 11/28/2019 Pharyngeal Phase -- Pharyngeal- Pudding Teaspoon -- Pharyngeal -- Pharyngeal- Pudding Cup -- Pharyngeal -- Pharyngeal- Honey Teaspoon -- Pharyngeal -- Pharyngeal- Honey Cup -- Pharyngeal -- Pharyngeal- Nectar Teaspoon WFL Pharyngeal Material does not enter airway Pharyngeal- Nectar Cup [facility] Pharyngeal Material does not enter airway Pharyngeal- Nectar Straw WFL Pharyngeal Material does not enter airway Pharyngeal- Thin Teaspoon NT Pharyngeal -- Pharyngeal- Thin Cup  [facility] Pharyngeal Material does not enter airway Pharyngeal- Thin Straw WFL Pharyngeal  Material does not enter airway Pharyngeal- Puree WFL Pharyngeal Material does not enter airway Pharyngeal- Mechanical Soft WFL Pharyngeal Material does not enter airway Pharyngeal- Regular -- Pharyngeal -- Pharyngeal- Multi-consistency -- Pharyngeal -- Pharyngeal- Pill -- Pharyngeal -- Pharyngeal Comment --  CHL IP CERVICAL ESOPHAGEAL PHASE 11/28/2019 Cervical Esophageal Phase Impaired Pudding Teaspoon -- Pudding Cup -- Honey Teaspoon -- Honey Cup -- Nectar Teaspoon -- Nectar Cup Reduced cricopharyngeal relaxation;Esophageal backflow into the pharynx;Prominent cricopharyngeal segment Nectar Straw Reduced cricopharyngeal relaxation;Esophageal backflow into the pharynx;Prominent cricopharyngeal segment Thin Teaspoon NT Thin Cup Reduced cricopharyngeal relaxation;Esophageal backflow into the pharynx;Prominent cricopharyngeal segment Thin Straw Reduced cricopharyngeal relaxation;Esophageal backflow into the pharynx;Prominent cricopharyngeal segment Puree Reduced cricopharyngeal relaxation;Prominent cricopharyngeal segment Mechanical Soft Reduced cricopharyngeal relaxation;Prominent cricopharyngeal segment Regular -- Multi-consistency -- Pill -- Cervical Esophageal Comment Pt with known Zenker's diverticulum resulting in backflow of barium into pharynx, liquid swallows following solids does not improve clearance and increases backflow.  Dry swallows (pt states she needs to conduct) are helpful to decrease residuals.  At end of testing, pt denies sensation of residuals however she appeared with some retention continued. Macario Golds 11/28/2019, 9:33 AM      Kathleen Lime, MS [facility] SLP Acute Rehab Services Office [phone]         DG C-Arm 1-60 Min  Result Date: 11/26/2019 CLINICAL DATA:  Fracture status post ORIF EXAM: DG C-ARM 1-60 MIN FLUOROSCOPY TIME:  Fluoroscopy Time:  50 seconds Number of Acquired Spot Images: 5 COMPARISON:   November 26, 2019 FINDINGS: Patient has undergone intramedullary nail placement through the right femur. The osseous alignment is improved. The hardware is intact. There are expected postsurgical changes including subcutaneous gas and overlying soft tissue edema. IMPRESSION: Status post ORIF of right femur fracture. Electronically Signed   By: Constance Holster M.D.   On: 11/26/2019 17:53   DG HIP PORT UNILAT WITH PELVIS 1V RIGHT  Result Date: 11/26/2019 CLINICAL DATA:  Postoperative evaluation. EXAM: DG HIP (WITH OR WITHOUT PELVIS) 1V PORT RIGHT COMPARISON:  None. FINDINGS: A radiopaque intramedullary rod and compression screw device are seen within the proximal right femur. A nondisplaced fracture of the proximal right femoral shaft is seen, this extends to involve the lesser trochanter the proximal right femur. Mild-to-moderate severity arthropathy is noted within the right hip joint. IMPRESSION: Status post open reduction and internal fixation of the proximal right femur. Electronically Signed   By: Virgina Norfolk M.D.   On: 11/26/2019 17:59   DG Hip Unilat W or Wo Pelvis 2-3 Views Right  Result Date: 11/26/2019 CLINICAL DATA:  Fall EXAM: DG HIP (WITH OR WITHOUT PELVIS) 2-3V RIGHT COMPARISON:  None. FINDINGS: Comminuted intertrochanteric fracture of the right femur. No dislocation. No left femur fracture or dislocation. No other pelvic fracture. IMPRESSION: Comminuted intertrochanteric fracture of the right femur. Electronically Signed   By: Ulyses Jarred M.D.   On: 11/26/2019 06:18   DG FEMUR, MIN 2 VIEWS RIGHT  Result Date: 11/26/2019 CLINICAL DATA:  Femur fracture status post ORIF EXAM: RIGHT FEMUR 2 VIEWS COMPARISON:  November 26, 2019 FINDINGS: Patient has undergone intramedullary nail placement through the right femur. The osseous alignment is improved. The hardware is intact. There are expected postsurgical changes including subcutaneous gas and overlying soft tissue edema. IMPRESSION: Status  post intramedullary nail placement through the right femur with improved osseous alignment. Electronically Signed   By: Constance Holster M.D.   On: 11/26/2019 17:52    Time coordinating discharge: Over 30 minutes  SIGNED:  Guilford Shi, MD  [facility] 11/30/2019, 11:27 AM

## 2019-12-02 DIAGNOSIS — K219 Gastro-esophageal reflux disease without esophagitis: Secondary | ICD-10-CM | POA: Diagnosis not present

## 2019-12-02 DIAGNOSIS — E441 Mild protein-calorie malnutrition: Secondary | ICD-10-CM | POA: Diagnosis not present

## 2019-12-02 DIAGNOSIS — S72101A Unspecified trochanteric fracture of right femur, initial encounter for closed fracture: Secondary | ICD-10-CM | POA: Diagnosis not present

## 2019-12-02 DIAGNOSIS — I10 Essential (primary) hypertension: Secondary | ICD-10-CM | POA: Diagnosis not present

## 2019-12-02 DIAGNOSIS — F0150 Vascular dementia without behavioral disturbance: Secondary | ICD-10-CM | POA: Diagnosis not present

## 2019-12-14 DIAGNOSIS — S72141D Displaced intertrochanteric fracture of right femur, subsequent encounter for closed fracture with routine healing: Secondary | ICD-10-CM | POA: Diagnosis not present

## 2019-12-15 DIAGNOSIS — K219 Gastro-esophageal reflux disease without esophagitis: Secondary | ICD-10-CM | POA: Diagnosis not present

## 2019-12-15 DIAGNOSIS — E441 Mild protein-calorie malnutrition: Secondary | ICD-10-CM | POA: Diagnosis not present

## 2019-12-15 DIAGNOSIS — I10 Essential (primary) hypertension: Secondary | ICD-10-CM | POA: Diagnosis not present

## 2019-12-15 DIAGNOSIS — S7291XA Unspecified fracture of right femur, initial encounter for closed fracture: Secondary | ICD-10-CM | POA: Diagnosis not present

## 2019-12-17 DIAGNOSIS — N1831 Chronic kidney disease, stage 3a: Secondary | ICD-10-CM | POA: Diagnosis not present

## 2019-12-17 DIAGNOSIS — Z86718 Personal history of other venous thrombosis and embolism: Secondary | ICD-10-CM | POA: Diagnosis not present

## 2019-12-17 DIAGNOSIS — Z681 Body mass index (BMI) 19 or less, adult: Secondary | ICD-10-CM | POA: Diagnosis not present

## 2019-12-17 DIAGNOSIS — F0150 Vascular dementia without behavioral disturbance: Secondary | ICD-10-CM | POA: Diagnosis not present

## 2019-12-17 DIAGNOSIS — E785 Hyperlipidemia, unspecified: Secondary | ICD-10-CM | POA: Diagnosis not present

## 2019-12-17 DIAGNOSIS — S72141D Displaced intertrochanteric fracture of right femur, subsequent encounter for closed fracture with routine healing: Secondary | ICD-10-CM | POA: Diagnosis not present

## 2019-12-17 DIAGNOSIS — K219 Gastro-esophageal reflux disease without esophagitis: Secondary | ICD-10-CM | POA: Diagnosis not present

## 2019-12-17 DIAGNOSIS — R1310 Dysphagia, unspecified: Secondary | ICD-10-CM | POA: Diagnosis not present

## 2019-12-17 DIAGNOSIS — Z86711 Personal history of pulmonary embolism: Secondary | ICD-10-CM | POA: Diagnosis not present

## 2019-12-17 DIAGNOSIS — Z9181 History of falling: Secondary | ICD-10-CM | POA: Diagnosis not present

## 2019-12-17 DIAGNOSIS — K5900 Constipation, unspecified: Secondary | ICD-10-CM | POA: Diagnosis not present

## 2019-12-17 DIAGNOSIS — I129 Hypertensive chronic kidney disease with stage 1 through stage 4 chronic kidney disease, or unspecified chronic kidney disease: Secondary | ICD-10-CM | POA: Diagnosis not present

## 2019-12-17 DIAGNOSIS — W19XXXD Unspecified fall, subsequent encounter: Secondary | ICD-10-CM | POA: Diagnosis not present

## 2019-12-20 DIAGNOSIS — E46 Unspecified protein-calorie malnutrition: Secondary | ICD-10-CM | POA: Diagnosis not present

## 2019-12-20 DIAGNOSIS — S72001D Fracture of unspecified part of neck of right femur, subsequent encounter for closed fracture with routine healing: Secondary | ICD-10-CM | POA: Diagnosis not present

## 2019-12-20 DIAGNOSIS — R413 Other amnesia: Secondary | ICD-10-CM | POA: Diagnosis not present

## 2019-12-20 DIAGNOSIS — D649 Anemia, unspecified: Secondary | ICD-10-CM | POA: Diagnosis not present

## 2019-12-20 DIAGNOSIS — I82402 Acute embolism and thrombosis of unspecified deep veins of left lower extremity: Secondary | ICD-10-CM | POA: Diagnosis not present

## 2019-12-20 DIAGNOSIS — M8000XD Age-related osteoporosis with current pathological fracture, unspecified site, subsequent encounter for fracture with routine healing: Secondary | ICD-10-CM | POA: Diagnosis not present

## 2019-12-20 DIAGNOSIS — I2699 Other pulmonary embolism without acute cor pulmonale: Secondary | ICD-10-CM | POA: Diagnosis not present

## 2020-01-07 DIAGNOSIS — Z681 Body mass index (BMI) 19 or less, adult: Secondary | ICD-10-CM | POA: Diagnosis not present

## 2020-01-07 DIAGNOSIS — I129 Hypertensive chronic kidney disease with stage 1 through stage 4 chronic kidney disease, or unspecified chronic kidney disease: Secondary | ICD-10-CM | POA: Diagnosis not present

## 2020-01-07 DIAGNOSIS — N1831 Chronic kidney disease, stage 3a: Secondary | ICD-10-CM | POA: Diagnosis not present

## 2020-01-07 DIAGNOSIS — W19XXXD Unspecified fall, subsequent encounter: Secondary | ICD-10-CM | POA: Diagnosis not present

## 2020-01-07 DIAGNOSIS — Z86711 Personal history of pulmonary embolism: Secondary | ICD-10-CM | POA: Diagnosis not present

## 2020-01-07 DIAGNOSIS — Z9181 History of falling: Secondary | ICD-10-CM | POA: Diagnosis not present

## 2020-01-07 DIAGNOSIS — F0150 Vascular dementia without behavioral disturbance: Secondary | ICD-10-CM | POA: Diagnosis not present

## 2020-01-07 DIAGNOSIS — Z86718 Personal history of other venous thrombosis and embolism: Secondary | ICD-10-CM | POA: Diagnosis not present

## 2020-01-07 DIAGNOSIS — K5900 Constipation, unspecified: Secondary | ICD-10-CM | POA: Diagnosis not present

## 2020-01-07 DIAGNOSIS — K219 Gastro-esophageal reflux disease without esophagitis: Secondary | ICD-10-CM | POA: Diagnosis not present

## 2020-01-07 DIAGNOSIS — S72141D Displaced intertrochanteric fracture of right femur, subsequent encounter for closed fracture with routine healing: Secondary | ICD-10-CM | POA: Diagnosis not present

## 2020-01-07 DIAGNOSIS — R1310 Dysphagia, unspecified: Secondary | ICD-10-CM | POA: Diagnosis not present

## 2020-01-07 DIAGNOSIS — E785 Hyperlipidemia, unspecified: Secondary | ICD-10-CM | POA: Diagnosis not present

## 2020-01-11 DIAGNOSIS — S72141D Displaced intertrochanteric fracture of right femur, subsequent encounter for closed fracture with routine healing: Secondary | ICD-10-CM | POA: Diagnosis not present

## 2020-02-07 DIAGNOSIS — K5900 Constipation, unspecified: Secondary | ICD-10-CM | POA: Diagnosis not present

## 2020-02-07 DIAGNOSIS — F0150 Vascular dementia without behavioral disturbance: Secondary | ICD-10-CM | POA: Diagnosis not present

## 2020-02-07 DIAGNOSIS — S72141D Displaced intertrochanteric fracture of right femur, subsequent encounter for closed fracture with routine healing: Secondary | ICD-10-CM | POA: Diagnosis not present

## 2020-02-07 DIAGNOSIS — E785 Hyperlipidemia, unspecified: Secondary | ICD-10-CM | POA: Diagnosis not present

## 2020-02-07 DIAGNOSIS — I129 Hypertensive chronic kidney disease with stage 1 through stage 4 chronic kidney disease, or unspecified chronic kidney disease: Secondary | ICD-10-CM | POA: Diagnosis not present

## 2020-02-07 DIAGNOSIS — K219 Gastro-esophageal reflux disease without esophagitis: Secondary | ICD-10-CM | POA: Diagnosis not present

## 2020-02-07 DIAGNOSIS — R1310 Dysphagia, unspecified: Secondary | ICD-10-CM | POA: Diagnosis not present

## 2020-02-07 DIAGNOSIS — W19XXXD Unspecified fall, subsequent encounter: Secondary | ICD-10-CM | POA: Diagnosis not present

## 2020-02-07 DIAGNOSIS — Z9181 History of falling: Secondary | ICD-10-CM | POA: Diagnosis not present

## 2020-02-07 DIAGNOSIS — Z86718 Personal history of other venous thrombosis and embolism: Secondary | ICD-10-CM | POA: Diagnosis not present

## 2020-02-07 DIAGNOSIS — Z86711 Personal history of pulmonary embolism: Secondary | ICD-10-CM | POA: Diagnosis not present

## 2020-02-07 DIAGNOSIS — N1831 Chronic kidney disease, stage 3a: Secondary | ICD-10-CM | POA: Diagnosis not present

## 2020-02-07 DIAGNOSIS — Z681 Body mass index (BMI) 19 or less, adult: Secondary | ICD-10-CM | POA: Diagnosis not present

## 2020-02-08 DIAGNOSIS — S72141D Displaced intertrochanteric fracture of right femur, subsequent encounter for closed fracture with routine healing: Secondary | ICD-10-CM | POA: Diagnosis not present

## 2020-04-09 ENCOUNTER — Telehealth (INDEPENDENT_AMBULATORY_CARE_PROVIDER_SITE_OTHER): Payer: PPO | Admitting: Neurology

## 2020-04-09 ENCOUNTER — Encounter: Payer: Self-pay | Admitting: Neurology

## 2020-04-09 ENCOUNTER — Other Ambulatory Visit: Payer: Self-pay

## 2020-04-09 VITALS — BP 155/77 | Ht 63.0 in | Wt 106.0 lb

## 2020-04-09 DIAGNOSIS — F0390 Unspecified dementia without behavioral disturbance: Secondary | ICD-10-CM

## 2020-04-09 DIAGNOSIS — F329 Major depressive disorder, single episode, unspecified: Secondary | ICD-10-CM | POA: Diagnosis not present

## 2020-04-09 DIAGNOSIS — F32A Depression, unspecified: Secondary | ICD-10-CM

## 2020-04-09 DIAGNOSIS — F03A0 Unspecified dementia, mild, without behavioral disturbance, psychotic disturbance, mood disturbance, and anxiety: Secondary | ICD-10-CM

## 2020-04-09 NOTE — Progress Notes (Signed)
Virtual Visit via Video Note The purpose of this virtual visit is to provide medical care while limiting exposure to the novel coronavirus.    Consent was obtained for video visit:  Yes.   Answered questions that patient had about telehealth interaction:  Yes.   I discussed the limitations, risks, security and privacy concerns of performing an evaluation and management service by telemedicine. I also discussed with the patient that there may be a patient responsible charge related to this service. The patient expressed understanding and agreed to proceed.  Pt location: Home Physician Location: office Name of referring provider:  Kelton Pillar, MD I connected with Alvia Grove at patients initiation/request on 04/09/2020 at  3:30 PM EDT by video enabled telemedicine application and verified that I am speaking with the correct person using two identifiers. Pt MRN:  [id number] Pt DOB:  Aug 29, 1926 Video Participants:  Alvia Grove;  Son-in-law   History of Present Illness:  The patient had a virtual video visit on 04/09/20. She is accompanied by her son-in-law who helps supplement the history today. She was last seen in the neurology clinic 8 months ago for dementia. SLUMS score 15/30 in 08/2019. She is on Donepezil 10mg  daily. When asked about her memory, she states "sometimes I forget." her family checks in on her multiple times a day. She lives with her husband and it appears there is some marital stress, she started getting upset reporting that they are not getting along so she is not getting good sleep. Her son reports that her medications were reduced and they feel she is doing better overall, no more fog since she is not taking as much pain medication. He does endorse that stress is a factor. She has headaches usually in the mornings when she wakes up. They are not bad, she does not take any medication and it resolves. No vision changes, nausea/vomiting. She reports vertigo where she was  waking up every morning drunk and unable to walk, they found it was due to an interaction with her medications, the vertigo resolved since stopping her BP medication. Her last fall was in March. She has less hallucinations recently, she was reporting people in the windows last month, seeing dogs in her yard.    History on Initial Assessment 10/23/2017: This is a pleasant 86 year old right-handed woman with a history of hypertension, hyperlipidemia, chronic kidney disease, presenting for evaluation of memory changes and confusion. Her daughter reports that she had a significant change in mental status last November 2018, "like a flipped switch." She had been having minor forgetfulness prior to this, such as repeating herself, but last November 2018 when she came to their home, the patient did not recognize her daughter or where she was. She thought her daughter was her childhood friend. She thought she was talking to her father and that her father would be there when they got home. She recalls going to her PCP and feeling confused. She had bloodwork, urinalysis, and head CT which were unremarkable. Her daughter was instructed to keep her off hydrocodone and Benadryl. Her daughter reports that she gradually improved, and was 90-95% back to normal within 4 weeks. She was almost completely clear cognitively when she was brought to [facility] on 08/15/17 for shortness of breath and left axilla pain and was found to have pulmonary embolism and DVT. She is now on Eliquis. She reports her memory can be bad sometimes, other times it's pretty good. She sometimes thinks the house is  in the wrong place. Her daughter reports sleep has never been good, but is better with 1/2 a pain pill and Benadryl. She lives with her husband, her daughter comes daily to administer her medications. She has stopped driving since November. At that time, she could not find a familiar store. Her daughter has been in charge of bills since she had C.diff  infection 3 years ago and was missing payments. She frequently misplaces things. She has left the stove on and burned food. Her daughter denies any paranoia. There is no family history of dementia. She fell getting out of the car a few weeks ago, no significant head injuries. No alcohol use.   She has infrequent mild headaches. She has back pain and constipation. She gets shaky when nervous. She denies any dizziness, diplopia, dysarthria/dysphagia, neck pain, focal numbness/tingling/weakness, bladder dysfunction, anosmia.     Current Outpatient Medications on File Prior to Visit  Medication Sig Dispense Refill  . acetaminophen (TYLENOL) 500 MG tablet Take 1 tablet (500 mg total) by mouth every 8 (eight) hours. 60 tablet 0  . apixaban (ELIQUIS) 5 MG TABS tablet 2 tablets orally twice a day for six days then one tablet twice a day afterwards (Patient taking differently: Take 5 mg by mouth 2 (two) times daily. ) 72 tablet 0  . cholecalciferol (VITAMIN D) 1000 UNITS tablet Take 1,000 Units by mouth daily.    [name] donepezil (ARICEPT) 10 MG tablet Take 1 tablet (10 mg total) by mouth daily. 90 tablet 3  . HYDROcodone-acetaminophen (NORCO/VICODIN) 5-325 MG tablet Take 1-2 tablets by mouth every 6 (six) hours as needed for moderate pain or severe pain. 50 tablet 0  . omeprazole (PRILOSEC) 20 MG capsule Take 20 mg by mouth daily.     No current facility-administered medications on file prior to visit.     Observations/Objective:   Vitals:   04/09/20 1412  BP: (!) 155/77  Weight: 106 lb (48.1 kg)  Height: 5\' 3"  (1.6 m)   GEN:  The patient appears stated age and is in NAD.  Neurological examination: Patient is awake, alert, oriented to year, did not know date. She states she is in her daughter's house. No aphasia or dysarthria. Intact fluency and comprehension. Remote and recent memory impaired, 2/3 delayed recall. Able to spell WORLD backwards, 1/5 serial 7s. Able to name and repeat. Cranial nerves:  Extraocular movements intact with no nystagmus. No facial asymmetry. Motor: moves all extremities symmetrically, at least anti-gravity x 4. No incoordination on finger to nose testing. Gait: slow and cautious, no ataxia   Assessment and Plan:   This is a pleasant 86 yo RH woman with a history of hypertension, hyperlipidemia, CKD, with mild dementia. SLUMS score 15/30 in 08/2019 (MMSE 23/30 in June 2020, 21/30 in February 2019). She is on Donepezil 10mg  daily. She started getting visibly upset at the end of the visit talking about her husband, this appears to be a stressor for her. We had previously discussed medications such as SSRIs for mood, however since she has been sensitive to medications in the past, we again agreed to hold off for now. Continue close supervision. She does not drive. Follow-up in 6 months, they know to call for any changes.    Follow Up Instructions:   -I discussed the assessment and treatment plan with the patient. The patient was provided an opportunity to ask questions and all were answered. The patient agreed with the plan and demonstrated an understanding of the instructions.  The patient was advised to call back or seek an in-person evaluation if the symptoms worsen or if the condition fails to improve as anticipated.   Cameron Sprang, MD

## 2020-04-09 NOTE — Progress Notes (Signed)
Broke hip in march 2021

## 2020-05-07 DIAGNOSIS — G4700 Insomnia, unspecified: Secondary | ICD-10-CM | POA: Diagnosis not present

## 2020-05-07 DIAGNOSIS — Z0000 Encounter for general adult medical examination without abnormal findings: Secondary | ICD-10-CM | POA: Diagnosis not present

## 2020-05-07 DIAGNOSIS — Z1389 Encounter for screening for other disorder: Secondary | ICD-10-CM | POA: Diagnosis not present

## 2020-05-07 DIAGNOSIS — F419 Anxiety disorder, unspecified: Secondary | ICD-10-CM | POA: Diagnosis not present

## 2020-05-07 DIAGNOSIS — M5137 Other intervertebral disc degeneration, lumbosacral region: Secondary | ICD-10-CM | POA: Diagnosis not present

## 2020-05-07 DIAGNOSIS — N183 Chronic kidney disease, stage 3 unspecified: Secondary | ICD-10-CM | POA: Diagnosis not present

## 2020-05-07 DIAGNOSIS — I440 Atrioventricular block, first degree: Secondary | ICD-10-CM | POA: Diagnosis not present

## 2020-05-07 DIAGNOSIS — E871 Hypo-osmolality and hyponatremia: Secondary | ICD-10-CM | POA: Diagnosis not present

## 2020-05-07 DIAGNOSIS — R413 Other amnesia: Secondary | ICD-10-CM | POA: Diagnosis not present

## 2020-05-07 DIAGNOSIS — E785 Hyperlipidemia, unspecified: Secondary | ICD-10-CM | POA: Diagnosis not present

## 2020-05-07 DIAGNOSIS — M8580 Other specified disorders of bone density and structure, unspecified site: Secondary | ICD-10-CM | POA: Diagnosis not present

## 2020-05-07 DIAGNOSIS — I129 Hypertensive chronic kidney disease with stage 1 through stage 4 chronic kidney disease, or unspecified chronic kidney disease: Secondary | ICD-10-CM | POA: Diagnosis not present

## 2020-05-29 DIAGNOSIS — R5383 Other fatigue: Secondary | ICD-10-CM | POA: Diagnosis not present

## 2020-05-29 DIAGNOSIS — E559 Vitamin D deficiency, unspecified: Secondary | ICD-10-CM | POA: Diagnosis not present

## 2020-05-29 DIAGNOSIS — M48061 Spinal stenosis, lumbar region without neurogenic claudication: Secondary | ICD-10-CM | POA: Diagnosis not present

## 2020-05-29 DIAGNOSIS — M810 Age-related osteoporosis without current pathological fracture: Secondary | ICD-10-CM | POA: Diagnosis not present

## 2020-05-29 DIAGNOSIS — M546 Pain in thoracic spine: Secondary | ICD-10-CM | POA: Diagnosis not present

## 2020-06-08 DIAGNOSIS — M810 Age-related osteoporosis without current pathological fracture: Secondary | ICD-10-CM | POA: Diagnosis not present

## 2020-06-08 DIAGNOSIS — Z780 Asymptomatic menopausal state: Secondary | ICD-10-CM | POA: Diagnosis not present

## 2020-06-28 DIAGNOSIS — M810 Age-related osteoporosis without current pathological fracture: Secondary | ICD-10-CM | POA: Diagnosis not present

## 2020-06-28 DIAGNOSIS — M48061 Spinal stenosis, lumbar region without neurogenic claudication: Secondary | ICD-10-CM | POA: Diagnosis not present

## 2020-08-13 DIAGNOSIS — M810 Age-related osteoporosis without current pathological fracture: Secondary | ICD-10-CM | POA: Diagnosis not present

## 2020-08-13 DIAGNOSIS — E46 Unspecified protein-calorie malnutrition: Secondary | ICD-10-CM | POA: Diagnosis not present

## 2020-08-13 DIAGNOSIS — R413 Other amnesia: Secondary | ICD-10-CM | POA: Diagnosis not present

## 2020-08-13 DIAGNOSIS — F339 Major depressive disorder, recurrent, unspecified: Secondary | ICD-10-CM | POA: Diagnosis not present

## 2020-09-26 DIAGNOSIS — R399 Unspecified symptoms and signs involving the genitourinary system: Secondary | ICD-10-CM | POA: Diagnosis not present

## 2020-10-17 DIAGNOSIS — I129 Hypertensive chronic kidney disease with stage 1 through stage 4 chronic kidney disease, or unspecified chronic kidney disease: Secondary | ICD-10-CM | POA: Diagnosis not present

## 2020-10-17 DIAGNOSIS — Z86718 Personal history of other venous thrombosis and embolism: Secondary | ICD-10-CM | POA: Diagnosis not present

## 2020-10-17 DIAGNOSIS — E559 Vitamin D deficiency, unspecified: Secondary | ICD-10-CM | POA: Diagnosis not present

## 2020-10-17 DIAGNOSIS — F419 Anxiety disorder, unspecified: Secondary | ICD-10-CM | POA: Diagnosis not present

## 2020-10-17 DIAGNOSIS — N1831 Chronic kidney disease, stage 3a: Secondary | ICD-10-CM | POA: Diagnosis not present

## 2020-10-17 DIAGNOSIS — F0280 Dementia in other diseases classified elsewhere without behavioral disturbance: Secondary | ICD-10-CM | POA: Diagnosis not present

## 2020-10-17 DIAGNOSIS — G309 Alzheimer's disease, unspecified: Secondary | ICD-10-CM | POA: Diagnosis not present

## 2020-10-17 DIAGNOSIS — S72001D Fracture of unspecified part of neck of right femur, subsequent encounter for closed fracture with routine healing: Secondary | ICD-10-CM | POA: Diagnosis not present

## 2020-10-17 DIAGNOSIS — R634 Abnormal weight loss: Secondary | ICD-10-CM | POA: Diagnosis not present

## 2020-10-17 DIAGNOSIS — R627 Adult failure to thrive: Secondary | ICD-10-CM | POA: Diagnosis not present

## 2020-10-17 DIAGNOSIS — R1310 Dysphagia, unspecified: Secondary | ICD-10-CM | POA: Diagnosis not present

## 2020-10-17 DIAGNOSIS — Z681 Body mass index (BMI) 19 or less, adult: Secondary | ICD-10-CM | POA: Diagnosis not present

## 2020-10-24 DIAGNOSIS — H40053 Ocular hypertension, bilateral: Secondary | ICD-10-CM | POA: Diagnosis not present

## 2020-11-12 ENCOUNTER — Telehealth: Payer: Self-pay | Admitting: Neurology

## 2020-11-12 ENCOUNTER — Other Ambulatory Visit: Payer: Self-pay

## 2020-11-12 MED ORDER — DONEPEZIL HCL 10 MG PO TABS: 10.0000 mg | ORAL_TABLET | Freq: Every day | ORAL | 0 refills | Status: DC

## 2020-11-12 NOTE — Telephone Encounter (Signed)
Patient's daughter called in wanting to see if she can get a refill of the patient's donepezil until her appointment? She does mail order and they are afraid they won't get it before she runs out if they wait until her appointment. They are wondering if they can get enough to get her to her appointment to [facility] on [address]?

## 2020-11-12 NOTE — Telephone Encounter (Signed)
Script for 15 pills sent to [facility] on [address] to last pt until her appointment with Dr Delice Lesch .

## 2020-11-23 ENCOUNTER — Other Ambulatory Visit: Payer: Self-pay

## 2020-11-23 ENCOUNTER — Encounter: Payer: Self-pay | Admitting: Neurology

## 2020-11-23 ENCOUNTER — Ambulatory Visit: Payer: PPO | Admitting: Neurology

## 2020-11-23 VITALS — BP 175/79 | HR 65 | Resp 20 | Ht 63.0 in | Wt 103.0 lb

## 2020-11-23 DIAGNOSIS — R5383 Other fatigue: Secondary | ICD-10-CM | POA: Diagnosis not present

## 2020-11-23 DIAGNOSIS — F03B18 Unspecified dementia, moderate, with other behavioral disturbance: Secondary | ICD-10-CM

## 2020-11-23 DIAGNOSIS — M810 Age-related osteoporosis without current pathological fracture: Secondary | ICD-10-CM | POA: Diagnosis not present

## 2020-11-23 DIAGNOSIS — E559 Vitamin D deficiency, unspecified: Secondary | ICD-10-CM | POA: Diagnosis not present

## 2020-11-23 DIAGNOSIS — F0391 Unspecified dementia with behavioral disturbance: Secondary | ICD-10-CM | POA: Diagnosis not present

## 2020-11-23 MED ORDER — DONEPEZIL HCL 10 MG PO TABS: 10.0000 mg | ORAL_TABLET | Freq: Every day | ORAL | 3 refills | Status: AC

## 2020-11-23 NOTE — Progress Notes (Signed)
NEUROLOGY FOLLOW UP OFFICE NOTE  Susan Banks [id number] 11/02/1925  HISTORY OF PRESENT ILLNESS: I had the pleasure of seeing Susan Banks in follow-up in the neurology clinic on 11/23/2020. She is again accompanied by her daughter who helps supplement the history today. The patient was last seen 7 months ago for dementia. SLUMS score 15/30 in 08/2019. She is on Donepezil 10mg  daily. Since her last visit, she has moved in with her daughter and son-in-law. She was not getting along with her husband and causing more distress. She feels more relaxed and fine now. She feels her memory is not bad, her daughter states it is worse. Her daughter manages medications and finances. She does not drive. Sleep is good, no hallucinations or paranoia. She has been having more irritability and anxiety, Prozac recently started, which has helped. She is also listed to be on Seroquel. She was having headaches which seems to have improved with her new eyeglasses and change in bed settings (she has a hospital bed). She gets dizzy when getting up in the morning. She had a fall last month picking flowers.    History on Initial Assessment 10/23/2017: This is a pleasant 87 year old right-handed woman with a history of hypertension, hyperlipidemia, chronic kidney disease, presenting for evaluation of memory changes and confusion. Her daughter reports that she had a significant change in mental status last November 2018, "like a flipped switch." She had been having minor forgetfulness prior to this, such as repeating herself, but last November 2018 when she came to their home, the patient did not recognize her daughter or where she was. She thought her daughter was her childhood friend. She thought she was talking to her father and that her father would be there when they got home. She recalls going to her PCP and feeling confused. She had bloodwork, urinalysis, and head CT which were unremarkable. Her daughter was instructed to  keep her off hydrocodone and Benadryl. Her daughter reports that she gradually improved, and was 90-95% back to normal within 4 weeks. She was almost completely clear cognitively when she was brought to [facility] on 08/15/17 for shortness of breath and left axilla pain and was found to have pulmonary embolism and DVT. She is now on Eliquis. She reports her memory can be bad sometimes, other times it's pretty good. She sometimes thinks the house is in the wrong place. Her daughter reports sleep has never been good, but is better with 1/2 a pain pill and Benadryl. She lives with her husband, her daughter comes daily to administer her medications. She has stopped driving since November. At that time, she could not find a familiar store. Her daughter has been in charge of bills since she had C.diff infection 3 years ago and was missing payments. She frequently misplaces things. She has left the stove on and burned food. Her daughter denies any paranoia. There is no family history of dementia. She fell getting out of the car a few weeks ago, no significant head injuries. No alcohol use.   She has infrequent mild headaches. She has back pain and constipation. She gets shaky when nervous. She denies any dizziness, diplopia, dysarthria/dysphagia, neck pain, focal numbness/tingling/weakness, bladder dysfunction, anosmia.    PAST MEDICAL HISTORY: Past Medical History:  Diagnosis Date  . C. difficile diarrhea   . Chronic anticoagulation   . Chronic kidney disease   . Constipation   . GERD (gastroesophageal reflux disease)   . Hypercholesteremia   . Hypertension   .  Left leg DVT ([place]) 08/2017  . Pulmonary embolism ([place]) 08/2017  . Shortness of breath     MEDICATIONS: Current Outpatient Medications on File Prior to Visit  Medication Sig Dispense Refill  . acetaminophen (TYLENOL) 500 MG tablet Take 1 tablet (500 mg total) by mouth every 8 (eight) hours. 60 tablet 0  . apixaban (ELIQUIS) 5 MG TABS tablet 2  tablets orally twice a day for six days then one tablet twice a day afterwards (Patient taking differently: Take 5 mg by mouth 2 (two) times daily. ) 72 tablet 0  . cholecalciferol (VITAMIN D) 1000 UNITS tablet Take 1,000 Units by mouth daily.    [name] donepezil (ARICEPT) 10 MG tablet Take 1 tablet (10 mg total) by mouth daily. 15 tablet 0  . HYDROcodone-acetaminophen (NORCO/VICODIN) 5-325 MG tablet Take 1-2 tablets by mouth every 6 (six) hours as needed for moderate pain or severe pain. 50 tablet 0  . omeprazole (PRILOSEC) 20 MG capsule Take 20 mg by mouth daily.     No current facility-administered medications on file prior to visit.    ALLERGIES: Allergies  Allergen Reactions  . Codeine Itching and Other (See Comments)    Has not tried benadryl along with medication Has not tried benadryl along with medication  . Sulfa Antibiotics Nausea And Vomiting  . Tramadol Hcl Itching  . Tramadol Hcl Itching    FAMILY HISTORY: History reviewed. No pertinent family history.  SOCIAL HISTORY: Social History   Socioeconomic History  . Marital status: Married    Spouse name: Not on file  . Number of children: Not on file  . Years of education: Not on file  . Highest education level: Not on file  Occupational History  . Not on file  Tobacco Use  . Smoking status: Former Smoker    Quit date: 06/14/1983    Years since quitting: 37.4  . Smokeless tobacco: Never Used  Vaping Use  . Vaping Use: Never used  Substance and Sexual Activity  . Alcohol use: No  . Drug use: No  . Sexual activity: Not Currently    Partners: Male  Other Topics Concern  . Not on file  Social History Narrative   Pt lives in 1 story home with her husband   Has 1 adult child   High school graduate   Retired Research scientist (physical sciences)   Social Determinants of Radio broadcast assistant Strain: Not on file  Food Insecurity: Not on file  Transportation Needs: Not on file  Physical Activity: Not on file  Stress: Not on file   Social Connections: Not on file  Intimate Partner Violence: Not on file     PHYSICAL EXAM: Vitals:   11/23/20 1516  BP: (!) 175/79  Pulse: 65  Resp: 20  SpO2: 96%   General: No acute distress Head:  Normocephalic/atraumatic Skin/Extremities: No rash, no edema Neurological Exam: alert and oriented to person, place. No aphasia or dysarthria. Fund of knowledge is reduced.  Recent and remote memory are impaired.  Attention and concentration are normal.  MMSE 22/30 MMSE - Mini Mental State Exam 11/23/2020 02/22/2019 10/23/2017  Orientation to time 0 0 1  Orientation to Place 4 5 4   Registration 3 3 3   Attention/ Calculation 5 5 5   Recall 1 2 0  Language- name 2 objects 2 2 2   Language- repeat 1 1 1   Language- follow 3 step command 3 3 3   Language- read & follow direction 1 1 1   Write a sentence 1  1 1  Copy design 1 0 0  Total score 22 23 21    Cranial nerves: Pupils equal, round. Extraocular movements intact .  No facial asymmetry.  Motor: moves all extremities symmetrically. Gait slow and cautious with walker   IMPRESSION: This is a pleasant 87 yo RH woman with a history of hypertension, hyperlipidemia, CKD, with mild to moderate dementia. MMSE today 22/20 (23/30 in June 2020, 21/30 in February 2019). Continue Donepezil 10mg  daily. We discussed diagnosis and prognosis, at this point risks of side effects from adding Memantine would likely be greater than potential benefits. Discussed the importance of quality of life, physical exercise and brain stimulation exercises for brain health. She has had improvement with addition of Prozac. Continue 24/7 care. Follow-up as needed, call for any changes.      Thank you for allowing me to participate in her care.  Please do not hesitate to call for any questions or concerns.   Ellouise Newer, M.D.   CC: Dr. [name]

## 2020-11-23 NOTE — Patient Instructions (Signed)
1. Continue Donepezil 10mg  daily, further refills can be through your PCP  2. Continue with other mood medications  3. Follow-up as needed, call for any changes

## 2020-12-24 DIAGNOSIS — Z111 Encounter for screening for respiratory tuberculosis: Secondary | ICD-10-CM | POA: Diagnosis not present

## 2021-01-02 DIAGNOSIS — M545 Low back pain, unspecified: Secondary | ICD-10-CM | POA: Diagnosis not present

## 2021-01-02 DIAGNOSIS — M810 Age-related osteoporosis without current pathological fracture: Secondary | ICD-10-CM | POA: Diagnosis not present

## 2021-01-08 DIAGNOSIS — R2681 Unsteadiness on feet: Secondary | ICD-10-CM | POA: Diagnosis not present

## 2021-01-08 DIAGNOSIS — M6281 Muscle weakness (generalized): Secondary | ICD-10-CM | POA: Diagnosis not present

## 2021-01-10 DIAGNOSIS — R319 Hematuria, unspecified: Secondary | ICD-10-CM | POA: Diagnosis not present

## 2021-01-10 DIAGNOSIS — R300 Dysuria: Secondary | ICD-10-CM | POA: Diagnosis not present

## 2021-01-10 DIAGNOSIS — F0281 Dementia in other diseases classified elsewhere with behavioral disturbance: Secondary | ICD-10-CM | POA: Diagnosis not present

## 2021-01-10 DIAGNOSIS — G309 Alzheimer's disease, unspecified: Secondary | ICD-10-CM | POA: Diagnosis not present

## 2021-01-24 DIAGNOSIS — R300 Dysuria: Secondary | ICD-10-CM | POA: Diagnosis not present

## 2021-01-24 DIAGNOSIS — M549 Dorsalgia, unspecified: Secondary | ICD-10-CM | POA: Diagnosis not present

## 2021-01-24 DIAGNOSIS — R2681 Unsteadiness on feet: Secondary | ICD-10-CM | POA: Diagnosis not present

## 2021-01-24 DIAGNOSIS — F0281 Dementia in other diseases classified elsewhere with behavioral disturbance: Secondary | ICD-10-CM | POA: Diagnosis not present

## 2021-01-24 DIAGNOSIS — K5903 Drug induced constipation: Secondary | ICD-10-CM | POA: Diagnosis not present

## 2021-01-24 DIAGNOSIS — G309 Alzheimer's disease, unspecified: Secondary | ICD-10-CM | POA: Diagnosis not present

## 2021-01-24 DIAGNOSIS — R42 Dizziness and giddiness: Secondary | ICD-10-CM | POA: Diagnosis not present

## 2021-02-07 DIAGNOSIS — F0281 Dementia in other diseases classified elsewhere with behavioral disturbance: Secondary | ICD-10-CM | POA: Diagnosis not present

## 2021-02-07 DIAGNOSIS — G309 Alzheimer's disease, unspecified: Secondary | ICD-10-CM | POA: Diagnosis not present

## 2021-02-14 DIAGNOSIS — F0281 Dementia in other diseases classified elsewhere with behavioral disturbance: Secondary | ICD-10-CM | POA: Diagnosis not present

## 2021-02-14 DIAGNOSIS — K5903 Drug induced constipation: Secondary | ICD-10-CM | POA: Diagnosis not present

## 2021-02-14 DIAGNOSIS — R634 Abnormal weight loss: Secondary | ICD-10-CM | POA: Diagnosis not present

## 2021-02-14 DIAGNOSIS — R2681 Unsteadiness on feet: Secondary | ICD-10-CM | POA: Diagnosis not present

## 2021-02-14 DIAGNOSIS — G309 Alzheimer's disease, unspecified: Secondary | ICD-10-CM | POA: Diagnosis not present

## 2021-02-14 DIAGNOSIS — N183 Chronic kidney disease, stage 3 unspecified: Secondary | ICD-10-CM | POA: Diagnosis not present

## 2021-02-28 DIAGNOSIS — R2681 Unsteadiness on feet: Secondary | ICD-10-CM | POA: Diagnosis not present

## 2021-02-28 DIAGNOSIS — G894 Chronic pain syndrome: Secondary | ICD-10-CM | POA: Diagnosis not present

## 2021-02-28 DIAGNOSIS — F0281 Dementia in other diseases classified elsewhere with behavioral disturbance: Secondary | ICD-10-CM | POA: Diagnosis not present

## 2021-02-28 DIAGNOSIS — R300 Dysuria: Secondary | ICD-10-CM | POA: Diagnosis not present

## 2021-02-28 DIAGNOSIS — G309 Alzheimer's disease, unspecified: Secondary | ICD-10-CM | POA: Diagnosis not present

## 2021-03-07 DIAGNOSIS — K5903 Drug induced constipation: Secondary | ICD-10-CM | POA: Diagnosis not present

## 2021-03-07 DIAGNOSIS — R451 Restlessness and agitation: Secondary | ICD-10-CM | POA: Diagnosis not present

## 2021-03-07 DIAGNOSIS — F0281 Dementia in other diseases classified elsewhere with behavioral disturbance: Secondary | ICD-10-CM | POA: Diagnosis not present

## 2021-03-07 DIAGNOSIS — I129 Hypertensive chronic kidney disease with stage 1 through stage 4 chronic kidney disease, or unspecified chronic kidney disease: Secondary | ICD-10-CM | POA: Diagnosis not present

## 2021-03-07 DIAGNOSIS — G309 Alzheimer's disease, unspecified: Secondary | ICD-10-CM | POA: Diagnosis not present

## 2021-03-07 DIAGNOSIS — N183 Chronic kidney disease, stage 3 unspecified: Secondary | ICD-10-CM | POA: Diagnosis not present

## 2021-03-07 DIAGNOSIS — R441 Visual hallucinations: Secondary | ICD-10-CM | POA: Diagnosis not present

## 2021-03-28 DIAGNOSIS — T17908A Unspecified foreign body in respiratory tract, part unspecified causing other injury, initial encounter: Secondary | ICD-10-CM | POA: Diagnosis not present

## 2021-03-28 DIAGNOSIS — R1310 Dysphagia, unspecified: Secondary | ICD-10-CM | POA: Diagnosis not present

## 2021-03-28 DIAGNOSIS — W19XXXA Unspecified fall, initial encounter: Secondary | ICD-10-CM | POA: Diagnosis not present

## 2021-03-28 DIAGNOSIS — R2681 Unsteadiness on feet: Secondary | ICD-10-CM | POA: Diagnosis not present

## 2021-04-04 DIAGNOSIS — W19XXXA Unspecified fall, initial encounter: Secondary | ICD-10-CM | POA: Diagnosis not present

## 2021-04-04 DIAGNOSIS — K5903 Drug induced constipation: Secondary | ICD-10-CM | POA: Diagnosis not present

## 2021-04-04 DIAGNOSIS — R451 Restlessness and agitation: Secondary | ICD-10-CM | POA: Diagnosis not present

## 2021-04-04 DIAGNOSIS — I10 Essential (primary) hypertension: Secondary | ICD-10-CM | POA: Diagnosis not present

## 2021-04-04 DIAGNOSIS — F0281 Dementia in other diseases classified elsewhere with behavioral disturbance: Secondary | ICD-10-CM | POA: Diagnosis not present

## 2021-04-04 DIAGNOSIS — G309 Alzheimer's disease, unspecified: Secondary | ICD-10-CM | POA: Diagnosis not present

## 2021-04-04 DIAGNOSIS — R2681 Unsteadiness on feet: Secondary | ICD-10-CM | POA: Diagnosis not present

## 2021-04-08 DIAGNOSIS — G894 Chronic pain syndrome: Secondary | ICD-10-CM | POA: Diagnosis not present

## 2021-04-08 DIAGNOSIS — F0281 Dementia in other diseases classified elsewhere with behavioral disturbance: Secondary | ICD-10-CM | POA: Diagnosis not present

## 2021-04-08 DIAGNOSIS — I129 Hypertensive chronic kidney disease with stage 1 through stage 4 chronic kidney disease, or unspecified chronic kidney disease: Secondary | ICD-10-CM | POA: Diagnosis not present

## 2021-04-08 DIAGNOSIS — G309 Alzheimer's disease, unspecified: Secondary | ICD-10-CM | POA: Diagnosis not present

## 2021-04-08 DIAGNOSIS — K5903 Drug induced constipation: Secondary | ICD-10-CM | POA: Diagnosis not present

## 2021-04-11 DIAGNOSIS — F0281 Dementia in other diseases classified elsewhere with behavioral disturbance: Secondary | ICD-10-CM | POA: Diagnosis not present

## 2021-04-11 DIAGNOSIS — G894 Chronic pain syndrome: Secondary | ICD-10-CM | POA: Diagnosis not present

## 2021-04-11 DIAGNOSIS — G309 Alzheimer's disease, unspecified: Secondary | ICD-10-CM | POA: Diagnosis not present

## 2021-04-11 DIAGNOSIS — R2681 Unsteadiness on feet: Secondary | ICD-10-CM | POA: Diagnosis not present

## 2021-04-11 DIAGNOSIS — R451 Restlessness and agitation: Secondary | ICD-10-CM | POA: Diagnosis not present

## 2021-04-11 DIAGNOSIS — W19XXXA Unspecified fall, initial encounter: Secondary | ICD-10-CM | POA: Diagnosis not present

## 2021-04-26 ENCOUNTER — Emergency Department

## 2021-04-26 ENCOUNTER — Emergency Department
Admission: EM | Admit: 2021-04-26 | Discharge: 2021-04-26 | Disposition: A | Discharge status: Home / Self Care | Attending: Emergency Medicine | Admitting: Emergency Medicine

## 2021-04-26 ENCOUNTER — Encounter: Payer: Self-pay | Admitting: Emergency Medicine

## 2021-04-26 ENCOUNTER — Other Ambulatory Visit: Payer: Self-pay

## 2021-04-26 DIAGNOSIS — N183 Chronic kidney disease, stage 3 unspecified: Secondary | ICD-10-CM | POA: Insufficient documentation

## 2021-04-26 DIAGNOSIS — Z87891 Personal history of nicotine dependence: Secondary | ICD-10-CM | POA: Diagnosis not present

## 2021-04-26 DIAGNOSIS — Z79899 Other long term (current) drug therapy: Secondary | ICD-10-CM | POA: Insufficient documentation

## 2021-04-26 DIAGNOSIS — W19XXXA Unspecified fall, initial encounter: Secondary | ICD-10-CM | POA: Insufficient documentation

## 2021-04-26 DIAGNOSIS — Z86718 Personal history of other venous thrombosis and embolism: Secondary | ICD-10-CM | POA: Insufficient documentation

## 2021-04-26 DIAGNOSIS — I129 Hypertensive chronic kidney disease with stage 1 through stage 4 chronic kidney disease, or unspecified chronic kidney disease: Secondary | ICD-10-CM | POA: Diagnosis not present

## 2021-04-26 DIAGNOSIS — H6691 Otitis media, unspecified, right ear: Secondary | ICD-10-CM | POA: Diagnosis not present

## 2021-04-26 DIAGNOSIS — Z7901 Long term (current) use of anticoagulants: Secondary | ICD-10-CM | POA: Diagnosis not present

## 2021-04-26 DIAGNOSIS — Y92129 Unspecified place in nursing home as the place of occurrence of the external cause: Secondary | ICD-10-CM | POA: Diagnosis not present

## 2021-04-26 DIAGNOSIS — F0390 Unspecified dementia without behavioral disturbance: Secondary | ICD-10-CM | POA: Diagnosis not present

## 2021-04-26 DIAGNOSIS — Z743 Need for continuous supervision: Secondary | ICD-10-CM | POA: Diagnosis not present

## 2021-04-26 DIAGNOSIS — H9201 Otalgia, right ear: Secondary | ICD-10-CM | POA: Diagnosis present

## 2021-04-26 DIAGNOSIS — R410 Disorientation, unspecified: Secondary | ICD-10-CM | POA: Diagnosis not present

## 2021-04-26 DIAGNOSIS — H6690 Otitis media, unspecified, unspecified ear: Secondary | ICD-10-CM

## 2021-04-26 LAB — URINALYSIS, COMPLETE (UACMP) WITH MICROSCOPIC
Bacteria, UA: NONE SEEN
Bilirubin Urine: NEGATIVE
Glucose, UA: NEGATIVE mg/dL
Hgb urine dipstick: NEGATIVE
Ketones, ur: 20 mg/dL — AB
Nitrite: NEGATIVE
Protein, ur: NEGATIVE mg/dL
Specific Gravity, Urine: 1.03 (ref 1.005–1.030)
pH: 5 (ref 5.0–8.0)

## 2021-04-26 LAB — COMPREHENSIVE METABOLIC PANEL
ALT: 16 U/L (ref 0–44)
AST: 21 U/L (ref 15–41)
Albumin: 3.4 g/dL — ABNORMAL LOW (ref 3.5–5.0)
Alkaline Phosphatase: 65 U/L (ref 38–126)
Anion gap: 8 (ref 5–15)
BUN: 29 mg/dL — ABNORMAL HIGH (ref 8–23)
CO2: 26 mmol/L (ref 22–32)
Calcium: 9.3 mg/dL (ref 8.9–10.3)
Chloride: 104 mmol/L (ref 98–111)
Creatinine, Ser: 0.99 mg/dL (ref 0.44–1.00)
GFR, Estimated: 53 mL/min — ABNORMAL LOW (ref >60)
Glucose, Bld: 99 mg/dL (ref 70–99)
Potassium: 4 mmol/L (ref 3.5–5.1)
Sodium: 138 mmol/L (ref 135–145)
Total Bilirubin: 0.4 mg/dL (ref 0.3–1.2)
Total Protein: 6.3 g/dL — ABNORMAL LOW (ref 6.5–8.1)

## 2021-04-26 LAB — CBC WITH DIFFERENTIAL/PLATELET
Abs Immature Granulocytes: 0.04 10*3/uL (ref 0.00–0.07)
Basophils Absolute: 0 10*3/uL (ref 0.0–0.1)
Basophils Relative: 0 %
Eosinophils Absolute: 0.1 10*3/uL (ref 0.0–0.5)
Eosinophils Relative: 1 %
HCT: 39.6 % (ref 36.0–46.0)
Hemoglobin: 13.3 g/dL (ref 12.0–15.0)
Immature Granulocytes: 0 %
Lymphocytes Relative: 25 %
Lymphs Abs: 2.5 10*3/uL (ref 0.7–4.0)
MCH: 31.5 pg (ref 26.0–34.0)
MCHC: 33.6 g/dL (ref 30.0–36.0)
MCV: 93.8 fL (ref 80.0–100.0)
Monocytes Absolute: 0.7 10*3/uL (ref 0.1–1.0)
Monocytes Relative: 7 %
Neutro Abs: 6.7 10*3/uL (ref 1.7–7.7)
Neutrophils Relative %: 67 %
Platelets: 204 10*3/uL (ref 150–400)
RBC: 4.22 MIL/uL (ref 3.87–5.11)
RDW: 13.5 % (ref 11.5–15.5)
WBC: 10 10*3/uL (ref 4.0–10.5)
nRBC: 0 % (ref 0.0–0.2)

## 2021-04-26 MED ORDER — AMOXICILLIN 500 MG PO TABS: 500.0000 mg | ORAL_TABLET | Freq: Two times a day (BID) | ORAL | 0 refills | Status: AC

## 2021-04-26 NOTE — ED Provider Notes (Addendum)
  Patient received in signout from Dr. Charna Archer pending screening blood work and urinalysis in the setting of an unwitnessed fall.  These return and are unremarkable.  No signs of sepsis or significant acute pathology.  No signs of acute cystitis.  Will discharge with a prescription for amoxicillin to treat acute otitis media, per original plan of care.   Vladimir Crofts, MD 04/26/21 1616    Vladimir Crofts, MD 04/26/21 [phone]

## 2021-04-26 NOTE — ED Provider Notes (Signed)
[facility] Emergency Department Provider Note   ____________________________________________   Event Date/Time   First MD Initiated Contact with Patient 04/26/21 1317     (approximate)  I have reviewed the triage vital signs and the nursing notes.   HISTORY  Chief Complaint Fall    HPI Susan Banks is a 87 y.o. female with past medical history of dementia, hypertension, hyperlipidemia, CKD, and DVT/PE on Eliquis who presents to the ED for fall.  History is limited due to patient's dementia, per EMS she was found down on the ground beside her bed at nursing facility.  Staff was unsure whether she hit her head or loss consciousness.  Daughter at bedside states patient has been complaining of pain in her right ear for the past couple of days.  She has otherwise appeared well and not complained of fevers, cough, chest pain, shortness of breath, dysuria, vomiting, or diarrhea.  Daughter states that patient initially complained of pain to her arms and legs, but patient now denies this.  Daughter reports that patient appears slightly more confused than usual.        Past Medical History:  Diagnosis Date   C. difficile diarrhea    Chronic anticoagulation    Chronic kidney disease    Constipation    GERD (gastroesophageal reflux disease)    Hypercholesteremia    Hypertension    Left leg DVT ([place]) 08/2017   Pulmonary embolism ([place]) 08/2017   Shortness of breath     Patient Active Problem List   Diagnosis Date Noted   Fracture, intertrochanteric, right femur ([place]) 11/26/2019   History of pulmonary embolus (PE) 11/26/2019   History of DVT (deep vein thrombosis) 11/26/2019   Fall at home, initial encounter 11/26/2019   Chronic anticoagulation    Chronic kidney disease    Pulmonary embolus ([place]) 08/15/2017   Hyperlipidemia 06/14/2015   Fever 06/14/2015   Altered mental status 06/13/2015   Hyponatremia 05/22/2015   Normocytic anemia 05/22/2015   GERD  (gastroesophageal reflux disease) 05/22/2015   Diarrhea 05/20/2015   HTN (hypertension) 05/20/2015   Chronic kidney disease, stage 3 ([place]) 05/20/2015   C. difficile colitis 05/20/2015   Proctocolitis     Past Surgical History:  Procedure Laterality Date   ABDOMINAL SURGERY     ESOPHAGOGASTRODUODENOSCOPY N/A 07/04/2014   Procedure: ESOPHAGOGASTRODUODENOSCOPY (EGD);  Surgeon: Laverda Page, MD;  Location: [place];  Service: Cardiovascular;  Laterality: N/A;   HIP FRACTURE SURGERY  11/2019   INTRAMEDULLARY (IM) NAIL INTERTROCHANTERIC Right 11/26/2019   Procedure: INTRAMEDULLARY (IM) NAIL INTERTROCHANTRIC;  Surgeon: Altamese [place], MD;  Location: [place];  Service: Orthopedics;  Laterality: Right;   TEE WITHOUT CARDIOVERSION N/A 06/13/2014   Procedure: TRANSESOPHAGEAL ECHOCARDIOGRAM (TEE);  Surgeon: Laverda Page, MD;  Location: [place];  Service: Cardiovascular;  Laterality: N/A;    Prior to Admission medications   Medication Sig Start Date End Date Taking? Authorizing Provider  acetaminophen (TYLENOL) 500 MG tablet Take 1 tablet (500 mg total) by mouth every 8 (eight) hours. 11/30/19  Yes Ainsley Spinner, PA-C  amoxicillin (AMOXIL) 500 MG tablet Take 1 tablet (500 mg total) by mouth 2 (two) times daily for 5 days. 04/26/21 05/01/21 Yes Blake Divine, MD  apixaban (ELIQUIS) 5 MG TABS tablet 2 tablets orally twice a day for six days then one tablet twice a day afterwards Patient taking differently: Take 5 mg by mouth 2 (two) times daily. 08/16/17  Yes Loletha Grayer, MD  cetirizine (ZYRTEC) 10  MG tablet Take 10 mg by mouth daily.   Yes [provider]  cholecalciferol (VITAMIN D) 1000 UNITS tablet Take 1,000 Units by mouth daily.   Yes [provider]  divalproex (DEPAKOTE SPRINKLE) 125 MG capsule Take 250 mg by mouth at bedtime.   Yes [provider]  docusate sodium (COLACE) 100 MG capsule 1 capsule as needed   Yes [provider]  donepezil  (ARICEPT) 10 MG tablet Take 1 tablet (10 mg total) by mouth daily. 11/23/20  Yes Cameron Sprang, MD  FLUoxetine (PROZAC) 10 MG tablet Take 5 mg by mouth daily. 10/24/20  Yes [provider]  HYDROcodone-acetaminophen (NORCO/VICODIN) 5-325 MG tablet Take 1-2 tablets by mouth every 6 (six) hours as needed for moderate pain or severe pain. 11/30/19  Yes Ainsley Spinner, PA-C  LORazepam (ATIVAN) 0.5 MG tablet Take 0.5 mg by mouth 3 (three) times daily as needed for anxiety. 1 bid prn   Yes [provider]  Psyllium (METAMUCIL) 0.36 g CAPS Take 1 capsule by mouth daily.   Yes [provider]  QUEtiapine (SEROQUEL) 25 MG tablet Take 25 mg by mouth 2 (two) times daily.   Yes [provider]  senna (SENOKOT) 8.6 MG TABS tablet Take 1 tablet by mouth every Monday, Wednesday, and Friday.   Yes [provider]  omeprazole (PRILOSEC) 20 MG capsule Take 20 mg by mouth daily. Patient not taking: Reported on 04/26/2021    [provider]    Allergies Codeine, Sulfa antibiotics, Tramadol hcl, and Tramadol hcl  No family history on file.  Social History Social History   Tobacco Use   Smoking status: Former    Types: Cigarettes    Quit date: 06/14/1983    Years since quitting: 37.8   Smokeless tobacco: Never  Vaping Use   Vaping Use: Never used  Substance Use Topics   Alcohol use: No   Drug use: No    Review of Systems  Constitutional: No fever/chills Eyes: No visual changes. ENT: No sore throat.  Positive for ear pain. Cardiovascular: Denies chest pain. Respiratory: Denies shortness of breath. Gastrointestinal: No abdominal pain.  No nausea, no vomiting.  No diarrhea.  No constipation. Genitourinary: Negative for dysuria. Musculoskeletal: Negative for back pain. Skin: Negative for rash. Neurological: Negative for headaches, focal weakness or numbness.  ____________________________________________   PHYSICAL EXAM:  VITAL SIGNS: ED Triage  Vitals  Enc Vitals Group     BP 04/26/21 1238 (!) 138/92     Pulse Rate 04/26/21 1238 88     Resp 04/26/21 1238 20     Temp 04/26/21 1238 98.1 F (36.7 C)     Temp src --      SpO2 04/26/21 1238 97 %     Weight 04/26/21 1239 102 lb 15.3 oz (46.7 kg)     Height 04/26/21 1239 '5\' 3"'$  (1.6 m)     Head Circumference --      Peak Flow --      Pain Score --      Pain Loc --      Pain Edu? --      Excl. in [place]? --     Constitutional: Alert and oriented to person and place, but not time. Eyes: Conjunctivae are normal. Head: Atraumatic. Ears: Erythema noted to right TM with associated effusion. Nose: No congestion/rhinnorhea. Mouth/Throat: Mucous membranes are moist. Neck: Normal ROM, no midline cervical spine tenderness to palpation. Cardiovascular: Normal rate, regular rhythm. Grossly normal heart sounds.  2+ radial pulses bilaterally. Respiratory: Normal respiratory effort.  No retractions. Lungs CTAB. Gastrointestinal: Soft and nontender. No distention. Genitourinary: deferred Musculoskeletal: No lower extremity tenderness nor edema.  No upper extremity bony tenderness to palpation. Neurologic:  Normal speech and language. No gross focal neurologic deficits are appreciated. Skin:  Skin is warm, dry and intact. No rash noted. Psychiatric: Mood and affect are normal. Speech and behavior are normal.  ____________________________________________   LABS (all labs ordered are listed, but only abnormal results are displayed)  Labs Reviewed  CBC WITH DIFFERENTIAL/PLATELET  COMPREHENSIVE METABOLIC PANEL  URINALYSIS, COMPLETE (UACMP) WITH MICROSCOPIC   ____________________________________________  EKG  ED ECG REPORT I, Blake Divine, the attending physician, personally viewed and interpreted this ECG.   Date: 04/26/2021  EKG Time: 12:46  Rate: 98  Rhythm: normal sinus rhythm  Axis: LAD  Intervals:none  ST&T Change: None   PROCEDURES  Procedure(s) performed (including  Critical Care):  Procedures   ____________________________________________   INITIAL IMPRESSION / ASSESSMENT AND PLAN / ED COURSE      87 year old female with past medical history of dementia, hypertension, hyperlipidemia, CKD, and DVT/PE on Eliquis who presents to the ED for unwitnessed fall after being found down beside her bed, is slightly more confused than usual per daughter.  She is awake and alert, no apparent focal neurologic deficits on exam.  CT head and cervical spine are negative for acute process.  No evidence of traumatic injury to her extremities.  She does appear to have an otitis media to her right ear and we will plan for treatment with amoxicillin.  We will screen labs and UA given her slightly worse mental status per daughter, low suspicion for sepsis given reassuring vital signs.  Patient turned over to onto provider pending additional results, anticipate discharge back to nursing facility if these are unremarkable.      ____________________________________________   FINAL CLINICAL IMPRESSION(S) / ED DIAGNOSES  Final diagnoses:  Fall, initial encounter  Acute otitis media, unspecified otitis media type     ED Discharge Orders          Ordered    amoxicillin (AMOXIL) 500 MG tablet  2 times daily        04/26/21 1541             Note:  This document was prepared using Dragon voice recognition software and may include unintentional dictation errors.    Blake Divine, MD 04/26/21 [phone]

## 2021-04-26 NOTE — Progress Notes (Signed)
[facility] [facility]) Hospital Liaison RN Note  [name] is a current hospice patient with a terminal diagnosis of abnormal weight loss and Alzheimer's Disease. Received report from facility that patient was being transported to the emergency department due to left hip pain and bruising to left forehead s/p unwitnessed fall.   Visited patient and daughter at bedside. Patient resting comfortably. [facility] hospital liaison will continue to follow through disposition.   Please do not hesitate to call with any hospice related questions.   Thank you,   Bobbie "Loren Racer, [place], BSN [facility] Liaison [phone]

## 2021-04-26 NOTE — ED Triage Notes (Signed)
Pt to ED via [facility] where she had an unwitnessed fall. She is on Eliquist and the staff is unsure if she hit her head or not. Pt is from the memory care unit.

## 2021-05-09 DIAGNOSIS — R451 Restlessness and agitation: Secondary | ICD-10-CM | POA: Diagnosis not present

## 2021-05-09 DIAGNOSIS — F0281 Dementia in other diseases classified elsewhere with behavioral disturbance: Secondary | ICD-10-CM | POA: Diagnosis not present

## 2021-05-09 DIAGNOSIS — N183 Chronic kidney disease, stage 3 unspecified: Secondary | ICD-10-CM | POA: Diagnosis not present

## 2021-05-09 DIAGNOSIS — G309 Alzheimer's disease, unspecified: Secondary | ICD-10-CM | POA: Diagnosis not present

## 2021-05-09 DIAGNOSIS — R2681 Unsteadiness on feet: Secondary | ICD-10-CM | POA: Diagnosis not present

## 2021-05-09 DIAGNOSIS — K5901 Slow transit constipation: Secondary | ICD-10-CM | POA: Diagnosis not present

## 2021-05-09 DIAGNOSIS — I129 Hypertensive chronic kidney disease with stage 1 through stage 4 chronic kidney disease, or unspecified chronic kidney disease: Secondary | ICD-10-CM | POA: Diagnosis not present

## 2021-05-16 DIAGNOSIS — S0083XA Contusion of other part of head, initial encounter: Secondary | ICD-10-CM | POA: Diagnosis not present

## 2021-05-16 DIAGNOSIS — R269 Unspecified abnormalities of gait and mobility: Secondary | ICD-10-CM | POA: Diagnosis not present

## 2021-05-16 DIAGNOSIS — R29898 Other symptoms and signs involving the musculoskeletal system: Secondary | ICD-10-CM | POA: Diagnosis not present

## 2021-05-16 DIAGNOSIS — I10 Essential (primary) hypertension: Secondary | ICD-10-CM | POA: Diagnosis not present

## 2021-05-16 DIAGNOSIS — W19XXXA Unspecified fall, initial encounter: Secondary | ICD-10-CM | POA: Diagnosis not present

## 2021-08-08 DEATH — deceased
